# Patient Record
Sex: Male | Born: 1955 | Race: White | Hispanic: No | Marital: Married | Smoking: Former smoker
Health system: Southern US, Community
[De-identification: ages and names within clinical notes are randomized; demographics above are authoritative.]

## PROBLEM LIST (undated history)

## (undated) DIAGNOSIS — R931 Abnormal findings on diagnostic imaging of heart and coronary circulation: Secondary | ICD-10-CM

## (undated) DIAGNOSIS — M25512 Pain in left shoulder: Secondary | ICD-10-CM

## (undated) DIAGNOSIS — I447 Left bundle-branch block, unspecified: Secondary | ICD-10-CM

## (undated) DIAGNOSIS — Z951 Presence of aortocoronary bypass graft: Secondary | ICD-10-CM

## (undated) DIAGNOSIS — F17200 Nicotine dependence, unspecified, uncomplicated: Secondary | ICD-10-CM

## (undated) DIAGNOSIS — IMO0001 Reserved for inherently not codable concepts without codable children: Secondary | ICD-10-CM

## (undated) DIAGNOSIS — N529 Male erectile dysfunction, unspecified: Secondary | ICD-10-CM

## (undated) DIAGNOSIS — J449 Chronic obstructive pulmonary disease, unspecified: Secondary | ICD-10-CM

## (undated) DIAGNOSIS — R0989 Other specified symptoms and signs involving the circulatory and respiratory systems: Secondary | ICD-10-CM

## (undated) DIAGNOSIS — K649 Unspecified hemorrhoids: Secondary | ICD-10-CM

## (undated) DIAGNOSIS — E291 Testicular hypofunction: Secondary | ICD-10-CM

## (undated) DIAGNOSIS — R233 Spontaneous ecchymoses: Secondary | ICD-10-CM

## (undated) DIAGNOSIS — Z952 Presence of prosthetic heart valve: Secondary | ICD-10-CM

## (undated) DIAGNOSIS — K602 Anal fissure, unspecified: Secondary | ICD-10-CM

## (undated) DIAGNOSIS — I25119 Atherosclerotic heart disease of native coronary artery with unspecified angina pectoris: Secondary | ICD-10-CM

## (undated) DIAGNOSIS — I10 Essential (primary) hypertension: Secondary | ICD-10-CM

## (undated) DIAGNOSIS — E559 Vitamin D deficiency, unspecified: Secondary | ICD-10-CM

## (undated) DIAGNOSIS — E785 Hyperlipidemia, unspecified: Secondary | ICD-10-CM

## (undated) DIAGNOSIS — J189 Pneumonia, unspecified organism: Secondary | ICD-10-CM

## (undated) DIAGNOSIS — I350 Nonrheumatic aortic (valve) stenosis: Secondary | ICD-10-CM

## (undated) DIAGNOSIS — R252 Cramp and spasm: Secondary | ICD-10-CM

## (undated) HISTORY — DX: Nonrheumatic aortic (valve) stenosis: I35.0

## (undated) HISTORY — DX: Testicular hypofunction: E29.1

## (undated) HISTORY — DX: Unspecified hemorrhoids: K64.9

## (undated) HISTORY — PX: COLONOSCOPY W/ POLYPECTOMY: SHX1380

## (undated) HISTORY — PX: APPENDECTOMY: SHX54

## (undated) HISTORY — DX: Hyperlipidemia, unspecified: E78.5

## (undated) HISTORY — DX: Chronic obstructive pulmonary disease, unspecified: J44.9

## (undated) HISTORY — DX: Abnormal findings on diagnostic imaging of heart and coronary circulation: R93.1

## (undated) HISTORY — DX: Spontaneous ecchymoses: R23.3

## (undated) HISTORY — DX: Other specified symptoms and signs involving the circulatory and respiratory systems: R09.89

## (undated) HISTORY — DX: Anal fissure, unspecified: K60.2

## (undated) HISTORY — PX: OTHER SURGICAL HISTORY: SHX169

## (undated) HISTORY — DX: Nicotine dependence, unspecified, uncomplicated: F17.200

## (undated) HISTORY — DX: Reserved for inherently not codable concepts without codable children: IMO0001

## (undated) HISTORY — DX: Essential (primary) hypertension: I10

## (undated) HISTORY — DX: Vitamin D deficiency, unspecified: E55.9

## (undated) HISTORY — DX: Pain in left shoulder: M25.512

## (undated) HISTORY — DX: Atherosclerotic heart disease of native coronary artery with unspecified angina pectoris: I25.119

## (undated) HISTORY — DX: Male erectile dysfunction, unspecified: N52.9

## (undated) HISTORY — DX: Cramp and spasm: R25.2

---

## 2003-12-29 ENCOUNTER — Ambulatory Visit (HOSPITAL_COMMUNITY): Admission: RE | Admit: 2003-12-29 | Discharge: 2003-12-29 | Payer: Self-pay | Admitting: Critical Care Medicine

## 2004-02-09 ENCOUNTER — Ambulatory Visit
Admission: RE | Admit: 2004-02-09 | Discharge: 2004-02-09 | Payer: Self-pay | Admitting: Thoracic Surgery (Cardiothoracic Vascular Surgery)

## 2004-02-15 ENCOUNTER — Inpatient Hospital Stay (HOSPITAL_COMMUNITY)
Admission: RE | Admit: 2004-02-15 | Discharge: 2004-02-20 | Payer: Self-pay | Admitting: Thoracic Surgery (Cardiothoracic Vascular Surgery)

## 2004-04-04 ENCOUNTER — Encounter
Admission: RE | Admit: 2004-04-04 | Discharge: 2004-04-04 | Payer: Self-pay | Admitting: Thoracic Surgery (Cardiothoracic Vascular Surgery)

## 2010-11-04 ENCOUNTER — Encounter: Payer: Self-pay | Admitting: Thoracic Surgery (Cardiothoracic Vascular Surgery)

## 2014-10-14 DIAGNOSIS — J189 Pneumonia, unspecified organism: Secondary | ICD-10-CM | POA: Insufficient documentation

## 2014-10-14 HISTORY — DX: Pneumonia, unspecified organism: J18.9

## 2017-05-26 ENCOUNTER — Ambulatory Visit: Payer: Self-pay | Admitting: Cardiology

## 2017-08-06 DIAGNOSIS — I350 Nonrheumatic aortic (valve) stenosis: Secondary | ICD-10-CM | POA: Diagnosis not present

## 2017-08-06 DIAGNOSIS — I10 Essential (primary) hypertension: Secondary | ICD-10-CM | POA: Diagnosis not present

## 2017-08-06 DIAGNOSIS — E785 Hyperlipidemia, unspecified: Secondary | ICD-10-CM | POA: Diagnosis not present

## 2017-08-06 DIAGNOSIS — Z1389 Encounter for screening for other disorder: Secondary | ICD-10-CM | POA: Diagnosis not present

## 2017-08-06 DIAGNOSIS — J449 Chronic obstructive pulmonary disease, unspecified: Secondary | ICD-10-CM | POA: Diagnosis not present

## 2017-08-06 DIAGNOSIS — Z23 Encounter for immunization: Secondary | ICD-10-CM | POA: Diagnosis not present

## 2017-08-06 DIAGNOSIS — E559 Vitamin D deficiency, unspecified: Secondary | ICD-10-CM | POA: Diagnosis not present

## 2017-08-12 ENCOUNTER — Ambulatory Visit: Payer: Self-pay | Admitting: Cardiology

## 2017-08-28 DIAGNOSIS — I350 Nonrheumatic aortic (valve) stenosis: Secondary | ICD-10-CM | POA: Insufficient documentation

## 2017-08-28 DIAGNOSIS — I10 Essential (primary) hypertension: Secondary | ICD-10-CM | POA: Insufficient documentation

## 2017-08-28 DIAGNOSIS — R42 Dizziness and giddiness: Secondary | ICD-10-CM

## 2017-08-28 DIAGNOSIS — J449 Chronic obstructive pulmonary disease, unspecified: Secondary | ICD-10-CM | POA: Diagnosis not present

## 2017-08-28 DIAGNOSIS — E559 Vitamin D deficiency, unspecified: Secondary | ICD-10-CM | POA: Insufficient documentation

## 2017-08-28 HISTORY — DX: Essential (primary) hypertension: I10

## 2017-08-28 HISTORY — DX: Dizziness and giddiness: R42

## 2017-08-28 HISTORY — DX: Nonrheumatic aortic (valve) stenosis: I35.0

## 2018-06-01 DIAGNOSIS — I350 Nonrheumatic aortic (valve) stenosis: Secondary | ICD-10-CM | POA: Diagnosis not present

## 2018-06-01 DIAGNOSIS — Z79899 Other long term (current) drug therapy: Secondary | ICD-10-CM | POA: Diagnosis not present

## 2018-06-22 ENCOUNTER — Encounter: Payer: Self-pay | Admitting: Cardiology

## 2018-06-22 DIAGNOSIS — I10 Essential (primary) hypertension: Secondary | ICD-10-CM | POA: Insufficient documentation

## 2018-06-29 ENCOUNTER — Ambulatory Visit (INDEPENDENT_AMBULATORY_CARE_PROVIDER_SITE_OTHER): Payer: Commercial Managed Care - PPO

## 2018-06-29 ENCOUNTER — Encounter: Payer: Self-pay | Admitting: Cardiology

## 2018-06-29 ENCOUNTER — Ambulatory Visit: Payer: Commercial Managed Care - PPO | Admitting: Cardiology

## 2018-06-29 VITALS — BP 127/62 | Ht 71.0 in | Wt 228.4 lb

## 2018-06-29 DIAGNOSIS — I350 Nonrheumatic aortic (valve) stenosis: Secondary | ICD-10-CM

## 2018-06-29 DIAGNOSIS — Z01818 Encounter for other preprocedural examination: Secondary | ICD-10-CM | POA: Diagnosis not present

## 2018-06-29 DIAGNOSIS — N529 Male erectile dysfunction, unspecified: Secondary | ICD-10-CM | POA: Insufficient documentation

## 2018-06-29 DIAGNOSIS — N5201 Erectile dysfunction due to arterial insufficiency: Secondary | ICD-10-CM

## 2018-06-29 DIAGNOSIS — I209 Angina pectoris, unspecified: Secondary | ICD-10-CM

## 2018-06-29 DIAGNOSIS — F17210 Nicotine dependence, cigarettes, uncomplicated: Secondary | ICD-10-CM | POA: Diagnosis not present

## 2018-06-29 DIAGNOSIS — I10 Essential (primary) hypertension: Secondary | ICD-10-CM | POA: Diagnosis not present

## 2018-06-29 DIAGNOSIS — Z13220 Encounter for screening for lipoid disorders: Secondary | ICD-10-CM | POA: Diagnosis not present

## 2018-06-29 DIAGNOSIS — I700 Atherosclerosis of aorta: Secondary | ICD-10-CM | POA: Diagnosis not present

## 2018-06-29 HISTORY — DX: Male erectile dysfunction, unspecified: N52.9

## 2018-06-29 LAB — CBC WITH DIFFERENTIAL/PLATELET
BASOS: 0 %
Basophils Absolute: 0 10*3/uL (ref 0.0–0.2)
EOS (ABSOLUTE): 0.1 10*3/uL (ref 0.0–0.4)
Eos: 2 %
HEMOGLOBIN: 14.2 g/dL (ref 13.0–17.7)
Hematocrit: 41.8 % (ref 37.5–51.0)
IMMATURE GRANS (ABS): 0 10*3/uL (ref 0.0–0.1)
Immature Granulocytes: 0 %
LYMPHS: 29 %
Lymphocytes Absolute: 2.1 10*3/uL (ref 0.7–3.1)
MCH: 32.1 pg (ref 26.6–33.0)
MCHC: 34 g/dL (ref 31.5–35.7)
MCV: 94 fL (ref 79–97)
MONOCYTES: 4 %
Monocytes Absolute: 0.3 10*3/uL (ref 0.1–0.9)
Neutrophils Absolute: 4.5 10*3/uL (ref 1.4–7.0)
Neutrophils: 65 %
Platelets: 226 10*3/uL (ref 150–450)
RBC: 4.43 x10E6/uL (ref 4.14–5.80)
RDW: 13.5 % (ref 12.3–15.4)
WBC: 7 10*3/uL (ref 3.4–10.8)

## 2018-06-29 LAB — BASIC METABOLIC PANEL
BUN/Creatinine Ratio: 24 (ref 10–24)
BUN: 18 mg/dL (ref 8–27)
CALCIUM: 8.9 mg/dL (ref 8.6–10.2)
CO2: 23 mmol/L (ref 20–29)
Chloride: 105 mmol/L (ref 96–106)
Creatinine, Ser: 0.74 mg/dL — ABNORMAL LOW (ref 0.76–1.27)
GFR calc Af Amer: 113 mL/min/{1.73_m2} (ref >59)
GFR calc non Af Amer: 97 mL/min/{1.73_m2} (ref >59)
Glucose: 104 mg/dL — ABNORMAL HIGH (ref 65–99)
POTASSIUM: 4.1 mmol/L (ref 3.5–5.2)
Sodium: 140 mmol/L (ref 134–144)

## 2018-06-29 LAB — ECHOCARDIOGRAM COMPLETE
HEIGHTINCHES: 71 in
Weight: 3654.4 oz

## 2018-06-29 LAB — LIPID PANEL
Chol/HDL Ratio: 3.7 ratio (ref 0.0–5.0)
Cholesterol, Total: 139 mg/dL (ref 100–199)
HDL: 38 mg/dL — ABNORMAL LOW (ref >39)
LDL Calculated: 66 mg/dL (ref 0–99)
Triglycerides: 173 mg/dL — ABNORMAL HIGH (ref 0–149)
VLDL Cholesterol Cal: 35 mg/dL (ref 5–40)

## 2018-06-29 LAB — HEPATIC FUNCTION PANEL
ALT: 20 IU/L (ref 0–44)
AST: 13 IU/L (ref 0–40)
Albumin: 4.4 g/dL (ref 3.6–4.8)
Alkaline Phosphatase: 55 IU/L (ref 39–117)
BILIRUBIN TOTAL: 0.4 mg/dL (ref 0.0–1.2)
BILIRUBIN, DIRECT: 0.11 mg/dL (ref 0.00–0.40)
Total Protein: 6.1 g/dL (ref 6.0–8.5)

## 2018-06-29 LAB — TSH: TSH: 1.09 u[IU]/mL (ref 0.450–4.500)

## 2018-06-29 MED ORDER — ASPIRIN EC 81 MG PO TBEC: 81.0000 mg | DELAYED_RELEASE_TABLET | Freq: Every day | ORAL | 3 refills | Status: DC

## 2018-06-29 NOTE — Patient Instructions (Signed)
Medication Instructions:  Your physician has recommended you make the following change in your medication:  START 81 mg of aspirin daily  Labwork: Your physician recommends that you have the following labs drawn: CBC, BMP, TSH, liver and lipid panel.  Testing/Procedures: A chest x-ray takes a picture of the organs and structures inside the chest, including the heart, lungs, and blood vessels. This test can show several things, including, whether the heart is enlarges; whether fluid is building up in the lungs; and whether pacemaker / defibrillator leads are still in place.     [facility] CARDIOVASCULAR DIVISION [facility]ART[facility] AT [place] [address] [place] [address] Dept: [phone] Loc: [phone]  JAYTHEN HAMME  06/29/2018  You are scheduled for a Cardiac Catheterization on Tuesday, September 17 with Dr. Verdis Prime.  1. Please arrive at the [facility] (Main Entrance A) at [facility]: [address] [place], [place] [address] at 5:30 AM (This time is two hours before your procedure to ensure your preparation). Free valet parking service is available.   Special note: Every effort is made to have your procedure done on time. Please understand that emergencies sometimes delay scheduled procedures.  2. Diet: Do not eat solid foods after midnight.  The patient may have clear liquids until 5am upon the day of the procedure.  3. Labs: Done today.  4. Medication instructions in preparation for your procedure:  Stop taking, lisinopril-HCTZ on Tuesday, September 17.  On the morning of your procedure, take your Aspirin and any morning medicines NOT listed above.  You may use sips of water.  5. Plan for one night stay--bring personal belongings. 6. Bring a current list of your medications and current insurance cards. 7. You MUST have a responsible person to drive you home. 8. Someone MUST be with you the first 24 hours after you arrive  home or your discharge will be delayed. 9. Please wear clothes that are easy to get on and off and wear slip-on shoes.  Thank you for allowing [place] to care for you!   -- [facility]   Follow-Up: Your physician recommends that you schedule a follow-up appointment in: 4 weeks  Any Other Special Instructions Will Be Listed Below (If Applicable).     If you need a refill on your cardiac medications before your next appointment, please call your pharmacy.   [facility]  Garey Ham, RN, BSN  Coronary Angiogram With Stent Coronary angiogram with stent placement is a procedure to widen or open a narrow blood vessel of the heart (coronary artery). Arteries may become blocked by cholesterol buildup (plaques) in the lining of the wall. When a coronary artery becomes partially blocked, blood flow to that area decreases. This may lead to chest pain or a heart attack (myocardial infarction). A stent is a small piece of metal that looks like mesh or a spring. Stent placement may be done as treatment for a heart attack or right after a coronary angiogram in which a blocked artery is found. Let your health care provider know about:  Any allergies you have.  All medicines you are taking, including vitamins, herbs, eye drops, creams, and over-the-counter medicines.  Any problems you or family members have had with anesthetic medicines.  Any blood disorders you have.  Any surgeries you have had.  Any medical conditions you have.  Whether you are pregnant or may be pregnant. What are the risks? Generally, this is  a safe procedure. However, problems may occur, including:  Damage to the heart or its blood vessels.  A return of blockage.  Bleeding, infection, or bruising at the insertion site.  A collection of blood under the skin (hematoma) at the insertion site.  A blood clot in another part of the body.  Kidney injury.  Allergic reaction to the dye  or contrast that is used.  Bleeding into the abdomen (retroperitoneal bleeding).  What happens before the procedure? Staying hydrated Follow instructions from your health care provider about hydration, which may include:  Up to 2 hours before the procedure - you may continue to drink clear liquids, such as water, clear fruit juice, black coffee, and plain tea.  Eating and drinking restrictions Follow instructions from your health care provider about eating and drinking, which may include:  8 hours before the procedure - stop eating heavy meals or foods such as meat, fried foods, or fatty foods.  6 hours before the procedure - stop eating light meals or foods, such as toast or cereal.  2 hours before the procedure - stop drinking clear liquids.  Ask your health care provider about:  Changing or stopping your regular medicines. This is especially important if you are taking diabetes medicines or blood thinners.  Taking medicines such as ibuprofen. These medicines can thin your blood. Do not take these medicines before your procedure if your health care provider instructs you not to. Generally, aspirin is recommended before a procedure of passing a small, thin tube (catheter) through a blood vessel and into the heart (cardiac catheterization).  What happens during the procedure?  An IV tube will be inserted into one of your veins.  You will be given one or more of the following: ? A medicine to help you relax (sedative). ? A medicine to numb the area where the catheter will be inserted into an artery (local anesthetic).  To reduce your risk of infection: ? Your health care team will wash or sanitize their hands. ? Your skin will be washed with soap. ? Hair may be removed from the area where the catheter will be inserted.  Using a guide wire, the catheter will be inserted into an artery. The location may be in your groin, in your wrist, or in the fold of your arm (near your  elbow).  A type of X-ray (fluoroscopy) will be used to help guide the catheter to the opening of the arteries in the heart.  A dye will be injected into the catheter, and X-rays will be taken. The dye will help to show where any narrowing or blockages are located in the arteries.  A tiny wire will be guided to the blocked spot, and a balloon will be inflated to make the artery wider.  The stent will be expanded and will crush the plaques into the wall of the vessel. The stent will hold the area open and improve the blood flow. Most stents have a drug coating to reduce the risk of the stent narrowing over time.  The artery may be made wider using a drill, laser, or other tools to remove plaques.  When the blood flow is better, the catheter will be removed. The lining of the artery will grow over the stent, which stays where it was placed. This procedure may vary among health care providers and hospitals. What happens after the procedure?  If the procedure is done through the leg, you will be kept in bed lying flat for about  6 hours. You will be instructed to not bend and not cross your legs.  The insertion site will be checked frequently.  The pulse in your foot or wrist will be checked frequently.  You may have additional blood tests, X-rays, and a test that records the electrical activity of your heart (electrocardiogram, or ECG). This information is not intended to replace advice given to you by your health care provider. Make sure you discuss any questions you have with your health care provider. Document Released: 04/06/2003 Document Revised: 05/30/2016 Document Reviewed: 05/05/2016 Elsevier Interactive Patient Education  2018 [employer].  Aspirin and Your Heart Aspirin is a medicine that affects the way blood clots. Aspirin can be used to help reduce the risk of blood clots, heart attacks, and other heart-related problems. Should I take aspirin? Your health care provider will  help you determine whether it is safe and beneficial for you to take aspirin daily. Taking aspirin daily may be beneficial if you:  Have had a heart attack or chest pain.  Have undergone open heart surgery such as coronary artery bypass surgery (CABG).  Have had coronary angioplasty.  Have experienced a stroke or transient ischemic attack (TIA).  Have peripheral vascular disease (PVD).  Have chronic heart rhythm problems such as atrial fibrillation.  Are there any risks of taking aspirin daily? Daily use of aspirin can increase your risk of side effects. Some of these include:  Bleeding. Bleeding problems can be minor or serious. An example of a minor problem is a cut that does not stop bleeding. An example of a more serious problem is stomach bleeding or bleeding into the brain. Your risk of bleeding is increased if you are also taking non-steroidal anti-inflammatory medicine (NSAIDs).  Increased bruising.  Upset stomach.  An allergic reaction. People who have nasal polyps have an increased risk of developing an aspirin allergy.  What are some guidelines I should follow when taking aspirin?  Take aspirin only as directed by your health care provider. Make sure you understand how much you should take and what form you should take. The two forms of aspirin are: ? Non-enteric-coated. This type of aspirin does not have a coating and is absorbed quickly. Non-enteric-coated aspirin is usually recommended for people with chest pain. This type of aspirin also comes in a chewable form. ? Enteric-coated. This type of aspirin has a special coating that releases the medicine very slowly. Enteric-coated aspirin causes less stomach upset than non-enteric-coated aspirin. This type of aspirin should not be chewed or crushed.  Drink alcohol in moderation. Drinking alcohol increases your risk of bleeding. When should I seek medical care?  You have unusual bleeding or bruising.  You have stomach  pain.  You have an allergic reaction. Symptoms of an allergic reaction include: ? Hives. ? Itchy skin. ? Swelling of the lips, tongue, or face.  You have ringing in your ears. When should I seek immediate medical care?  Your bowel movements are bloody, dark red, or black in color.  You vomit or cough up blood.  You have blood in your urine.  You cough, wheeze, or feel short of breath. If you have any of the following symptoms, this is an emergency. Do not wait to see if the pain will go away. Get medical help at once. Call your local emergency services (911 in the [place] Do not drive yourself to the hospital.  You have severe chest pain, especially if the pain is crushing or pressure-like and spreads  to the arms, back, neck, or jaw.  You have stroke-like symptoms, such as: ? Loss of vision. ? Difficulty talking. ? Numbness or weakness on one side of your body. ? Numbness or weakness in your arm or leg. ? Not thinking clearly or feeling confused.  This information is not intended to replace advice given to you by your health care provider. Make sure you discuss any questions you have with your health care provider. Document Released: 09/12/2008 Document Revised: 02/07/2016 Document Reviewed: 01/05/2014 Elsevier Interactive Patient Education  2018 [employer].

## 2018-06-29 NOTE — H&P (View-Only) (Signed)
Cardiology Office Note:    Date:  06/29/2018   ID:  Blake Griffin, DOB 13-Apr-1956, MRN [id number]  PCP:  Wilmer Floor., MD  Cardiologist:  Garwin Brothers, MD   Referring MD: Wilmer Floor., MD    ASSESSMENT:    1. Angina pectoris (HCC)   2. Aortic valve stenosis, etiology of cardiac valve disease unspecified   3. Essential hypertension   4. Cigarette smoker   5. Erectile dysfunction due to arterial insufficiency    PLAN:    In order of problems listed above:  1. Patient symptoms are very concerning and he has multiple risk factors for coronary artery disease.  His symptoms are very suggestive of angina pectoris. 2. Plan to obtain a urgent echocardiogram to assess left ventricular systolic function and the murmur heard on auscultation and the status of the aortic valve. 3. In view of the patient's symptoms, I discussed with the patient options for evaluation. Invasive and noninvasive options were given to the patient. I discussed stress testing and coronary angiography and left heart catheterization at length. Benefits, pros and cons of each approach were discussed at length. Patient had multiple questions which were answered to the patient's satisfaction. Patient opted for invasive evaluation and we will set up for coronary angiography and left heart catheterization. Further recommendations will be made based on the findings with coronary angiography. In the interim if the patient has any significant symptoms in hospital to the nearest emergency room. 4. I spent 5 minutes with the patient discussing solely about smoking. Smoking cessation was counseled. I suggested to the patient also different medications and pharmacological interventions. Patient is keen to try stopping on its own at this time. He will get back to me if he needs any further assistance in this matter.    Medication Adjustments/Labs and Tests Ordered: Current medicines are reviewed at length with the  patient today.  Concerns regarding medicines are outlined above.  No orders of the defined types were placed in this encounter.  No orders of the defined types were placed in this encounter.    History of Present Illness:    Blake Griffin is a 62 y.o. male who is being seen today for the evaluation of chest tightness on exertion at the request of Wilmer Floor., MD.  Patient is a pleasant 62 year old gentleman.  He has a history of aortic stenosis and essential hypertension.  He mentions to me that upon exertion he has chest tightness he has to stop and pace himself.  He leads a very sedentary lifestyle.  No orthopnea or PND.  No dizziness no syncopal spells.  No shortness of breath.  This reason he sent here for an evaluation.  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  At the time of my evaluation, the patient is alert awake oriented and in no distress.  His a very extensive smoking history smoking more than a pack since young age.  Past Medical History:  Diagnosis Date  . Anal fissure   . Aortic stenosis   . Bilateral carotid bruits   . COPD (chronic obstructive pulmonary disease) (HCC)   . Dyslipidemia   . ED (erectile dysfunction)   . Hemorrhoids   . Hypertension   . Hypogonadism in male   . Leg cramps   . Osteoarthrosis   . Pain of left shoulder region   . Petechiae   . Smoking   . Vitamin D deficiency  Past Surgical History:  Procedure Laterality Date  . APPENDECTOMY      Current Medications: Current Meds  Medication Sig  . albuterol (PROVENTIL HFA;VENTOLIN HFA) 108 (90 Base) MCG/ACT inhaler Inhale 2 puffs into the lungs every 6 (six) hours as needed.  Marland Kitchen. lisinopril-hydrochlorothiazide (PRINZIDE,ZESTORETIC) 20-12.5 MG tablet Take 1 tablet by mouth daily.  . valACYclovir (VALTREX) 500 MG tablet Take 500 mg by mouth as needed.      Allergies:   Patient has no known allergies.   Social History   Socioeconomic History  . Marital  status: Married    Spouse name: Not on file  . Number of children: Not on file  . Years of education: Not on file  . Highest education level: Not on file  Occupational History  . Not on file  Social Needs  . Financial resource strain: Not on file  . Food insecurity:    Worry: Not on file    Inability: Not on file  . Transportation needs:    Medical: Not on file    Non-medical: Not on file  Tobacco Use  . Smoking status: Current Every Day Smoker  . Smokeless tobacco: Never Used  Substance and Sexual Activity  . Alcohol use: Not on file  . Drug use: Not on file  . Sexual activity: Not on file  Lifestyle  . Physical activity:    Days per week: Not on file    Minutes per session: Not on file  . Stress: Not on file  Relationships  . Social connections:    Talks on phone: Not on file    Gets together: Not on file    Attends religious service: Not on file    Active member of club or organization: Not on file    Attends meetings of clubs or organizations: Not on file    Relationship status: Not on file  Other Topics Concern  . Not on file  Social History Narrative  . Not on file     Family History: The patient's family history includes Hypertension in his father and mother.  ROS:   Please see the history of present illness.    All other systems reviewed and are negative.  EKGs/Labs/Other Studies Reviewed:    The following studies were reviewed today: EKG reveals sinus rhythm and nonspecific ST-T changes   Recent Labs: No results found for requested labs within last 8760 hours.  Recent Lipid Panel No results found for: CHOL, TRIG, HDL, CHOLHDL, VLDL, LDLCALC, LDLDIRECT  Physical Exam:    VS:  BP 127/62 (BP Location: Right Arm, Patient Position: Sitting, Cuff Size: Normal)   Ht 5\' 11"  (1.803 m)   Wt 228 lb 6.4 oz (103.6 kg)   SpO2 98%   BMI 31.86 kg/m     Wt Readings from Last 3 Encounters:  06/29/18 228 lb 6.4 oz (103.6 kg)     GEN: Patient is in no  acute distress HEENT: Normal NECK: No JVD; No carotid bruits LYMPHATICS: No lymphadenopathy CARDIAC: S1 S2 regular, 3/6 systolic murmur at the aortic area. RESPIRATORY:  Clear to auscultation without rales, wheezing or rhonchi  ABDOMEN: Soft, non-tender, non-distended MUSCULOSKELETAL:  No edema; No deformity  SKIN: Warm and dry NEUROLOGIC:  Alert and oriented x 3 PSYCHIATRIC:  Normal affect    Signed, Garwin Brothersajan R Revankar, MD  06/29/2018 9:13 AM    [facility]

## 2018-06-29 NOTE — Progress Notes (Signed)
Cardiology Office Note:    Date:  06/29/2018   ID:  Blake Griffin, DOB 07/20/1956, MRN [id number]  PCP:  Campbell, Stephen D., MD  Cardiologist:  Rajan R Revankar, MD   Referring MD: Campbell, Stephen D., MD    ASSESSMENT:    1. Angina pectoris (HCC)   2. Aortic valve stenosis, etiology of cardiac valve disease unspecified   3. Essential hypertension   4. Cigarette smoker   5. Erectile dysfunction due to arterial insufficiency    PLAN:    In order of problems listed above:  1. Patient symptoms are very concerning and he has multiple risk factors for coronary artery disease.  His symptoms are very suggestive of angina pectoris. 2. Plan to obtain a urgent echocardiogram to assess left ventricular systolic function and the murmur heard on auscultation and the status of the aortic valve. 3. In view of the patient's symptoms, I discussed with the patient options for evaluation. Invasive and noninvasive options were given to the patient. I discussed stress testing and coronary angiography and left heart catheterization at length. Benefits, pros and cons of each approach were discussed at length. Patient had multiple questions which were answered to the patient's satisfaction. Patient opted for invasive evaluation and we will set up for coronary angiography and left heart catheterization. Further recommendations will be made based on the findings with coronary angiography. In the interim if the patient has any significant symptoms in hospital to the nearest emergency room. 4. I spent 5 minutes with the patient discussing solely about smoking. Smoking cessation was counseled. I suggested to the patient also different medications and pharmacological interventions. Patient is keen to try stopping on its own at this time. He will get back to me if he needs any further assistance in this matter.    Medication Adjustments/Labs and Tests Ordered: Current medicines are reviewed at length with the  patient today.  Concerns regarding medicines are outlined above.  No orders of the defined types were placed in this encounter.  No orders of the defined types were placed in this encounter.    History of Present Illness:    Blake Griffin is a 62 y.o. male who is being seen today for the evaluation of chest tightness on exertion at the request of Campbell, Stephen D., MD.  Patient is a pleasant 62-year-old gentleman.  He has a history of aortic stenosis and essential hypertension.  He mentions to me that upon exertion he has chest tightness he has to stop and pace himself.  He leads a very sedentary lifestyle.  No orthopnea or PND.  No dizziness no syncopal spells.  No shortness of breath.  This reason he sent here for an evaluation.  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  At the time of my evaluation, the patient is alert awake oriented and in no distress.  His a very extensive smoking history smoking more than a pack since young age.  Past Medical History:  Diagnosis Date  . Anal fissure   . Aortic stenosis   . Bilateral carotid bruits   . COPD (chronic obstructive pulmonary disease) (HCC)   . Dyslipidemia   . ED (erectile dysfunction)   . Hemorrhoids   . Hypertension   . Hypogonadism in male   . Leg cramps   . Osteoarthrosis   . Pain of left shoulder region   . Petechiae   . Smoking   . Vitamin D deficiency       Past Surgical History:  Procedure Laterality Date  . APPENDECTOMY      Current Medications: Current Meds  Medication Sig  . albuterol (PROVENTIL HFA;VENTOLIN HFA) 108 (90 Base) MCG/ACT inhaler Inhale 2 puffs into the lungs every 6 (six) hours as needed.  . lisinopril-hydrochlorothiazide (PRINZIDE,ZESTORETIC) 20-12.5 MG tablet Take 1 tablet by mouth daily.  . valACYclovir (VALTREX) 500 MG tablet Take 500 mg by mouth as needed.      Allergies:   Patient has no known allergies.   Social History   Socioeconomic History  . Marital  status: Married    Spouse name: Not on file  . Number of children: Not on file  . Years of education: Not on file  . Highest education level: Not on file  Occupational History  . Not on file  Social Needs  . Financial resource strain: Not on file  . Food insecurity:    Worry: Not on file    Inability: Not on file  . Transportation needs:    Medical: Not on file    Non-medical: Not on file  Tobacco Use  . Smoking status: Current Every Day Smoker  . Smokeless tobacco: Never Used  Substance and Sexual Activity  . Alcohol use: Not on file  . Drug use: Not on file  . Sexual activity: Not on file  Lifestyle  . Physical activity:    Days per week: Not on file    Minutes per session: Not on file  . Stress: Not on file  Relationships  . Social connections:    Talks on phone: Not on file    Gets together: Not on file    Attends religious service: Not on file    Active member of club or organization: Not on file    Attends meetings of clubs or organizations: Not on file    Relationship status: Not on file  Other Topics Concern  . Not on file  Social History Narrative  . Not on file     Family History: The patient's family history includes Hypertension in his father and mother.  ROS:   Please see the history of present illness.    All other systems reviewed and are negative.  EKGs/Labs/Other Studies Reviewed:    The following studies were reviewed today: EKG reveals sinus rhythm and nonspecific ST-T changes   Recent Labs: No results found for requested labs within last 8760 hours.  Recent Lipid Panel No results found for: CHOL, TRIG, HDL, CHOLHDL, VLDL, LDLCALC, LDLDIRECT  Physical Exam:    VS:  BP 127/62 (BP Location: Right Arm, Patient Position: Sitting, Cuff Size: Normal)   Ht 5' 11" (1.803 m)   Wt 228 lb 6.4 oz (103.6 kg)   SpO2 98%   BMI 31.86 kg/m     Wt Readings from Last 3 Encounters:  06/29/18 228 lb 6.4 oz (103.6 kg)     GEN: Patient is in no  acute distress HEENT: Normal NECK: No JVD; No carotid bruits LYMPHATICS: No lymphadenopathy CARDIAC: S1 S2 regular, 3/6 systolic murmur at the aortic area. RESPIRATORY:  Clear to auscultation without rales, wheezing or rhonchi  ABDOMEN: Soft, non-tender, non-distended MUSCULOSKELETAL:  No edema; No deformity  SKIN: Warm and dry NEUROLOGIC:  Alert and oriented x 3 PSYCHIATRIC:  Normal affect    Signed, Rajan R Revankar, MD  06/29/2018 9:13 AM     [facility]   

## 2018-06-29 NOTE — Progress Notes (Signed)
Complete echocardiogram has been performed.  Jimmy Marysol Wellnitz RDCS, RVT 

## 2018-06-29 NOTE — Addendum Note (Signed)
Addended by: Craige CottaANDERSON, ASHLEY S on: 06/29/2018 09:38 AM   Modules accepted: Orders

## 2018-06-29 NOTE — H&P (Signed)
Critical aortic stenosis by echo. Cardiac catheterization should be left and right heart with coronary angiography to stage IV aortic valve treatment and possible coronary disease of present.

## 2018-06-30 ENCOUNTER — Encounter (HOSPITAL_COMMUNITY)
Admission: RE | Disposition: A | Discharge status: Home / Self Care | Payer: Self-pay | Source: Ambulatory Visit | Attending: Interventional Cardiology

## 2018-06-30 ENCOUNTER — Ambulatory Visit (HOSPITAL_COMMUNITY)
Admission: RE | Admit: 2018-06-30 | Discharge: 2018-06-30 | Disposition: A | Discharge status: Home / Self Care | Payer: Commercial Managed Care - PPO | Source: Ambulatory Visit | Attending: Interventional Cardiology | Admitting: Interventional Cardiology

## 2018-06-30 DIAGNOSIS — I25119 Atherosclerotic heart disease of native coronary artery with unspecified angina pectoris: Secondary | ICD-10-CM | POA: Diagnosis not present

## 2018-06-30 DIAGNOSIS — I771 Stricture of artery: Secondary | ICD-10-CM | POA: Insufficient documentation

## 2018-06-30 DIAGNOSIS — M1990 Unspecified osteoarthritis, unspecified site: Secondary | ICD-10-CM | POA: Insufficient documentation

## 2018-06-30 DIAGNOSIS — N5201 Erectile dysfunction due to arterial insufficiency: Secondary | ICD-10-CM | POA: Insufficient documentation

## 2018-06-30 DIAGNOSIS — I10 Essential (primary) hypertension: Secondary | ICD-10-CM | POA: Insufficient documentation

## 2018-06-30 DIAGNOSIS — J449 Chronic obstructive pulmonary disease, unspecified: Secondary | ICD-10-CM | POA: Insufficient documentation

## 2018-06-30 DIAGNOSIS — I2510 Atherosclerotic heart disease of native coronary artery without angina pectoris: Secondary | ICD-10-CM | POA: Diagnosis not present

## 2018-06-30 DIAGNOSIS — I350 Nonrheumatic aortic (valve) stenosis: Secondary | ICD-10-CM | POA: Diagnosis not present

## 2018-06-30 DIAGNOSIS — F17210 Nicotine dependence, cigarettes, uncomplicated: Secondary | ICD-10-CM | POA: Diagnosis not present

## 2018-06-30 DIAGNOSIS — E785 Hyperlipidemia, unspecified: Secondary | ICD-10-CM | POA: Insufficient documentation

## 2018-06-30 DIAGNOSIS — I209 Angina pectoris, unspecified: Secondary | ICD-10-CM

## 2018-06-30 HISTORY — PX: RIGHT/LEFT HEART CATH AND CORONARY ANGIOGRAPHY: CATH118266

## 2018-06-30 LAB — POCT I-STAT 3, VENOUS BLOOD GAS (G3P V)
ACID-BASE DEFICIT: 2 mmol/L (ref 0.0–2.0)
BICARBONATE: 24.6 mmol/L (ref 20.0–28.0)
O2 SAT: 69 %
PO2 VEN: 39 mmHg (ref 32.0–45.0)
TCO2: 26 mmol/L (ref 22–32)
pCO2, Ven: 46.3 mmHg (ref 44.0–60.0)
pH, Ven: 7.333 (ref 7.250–7.430)

## 2018-06-30 LAB — POCT I-STAT 3, ART BLOOD GAS (G3+)
Acid-base deficit: 6 mmol/L — ABNORMAL HIGH (ref 0.0–2.0)
Bicarbonate: 20.5 mmol/L (ref 20.0–28.0)
O2 SAT: 94 %
PO2 ART: 77 mmHg — AB (ref 83.0–108.0)
TCO2: 22 mmol/L (ref 22–32)
pCO2 arterial: 42.6 mmHg (ref 32.0–48.0)
pH, Arterial: 7.29 — ABNORMAL LOW (ref 7.350–7.450)

## 2018-06-30 SURGERY — RIGHT/LEFT HEART CATH AND CORONARY ANGIOGRAPHY
Anesthesia: LOCAL

## 2018-06-30 MED ORDER — HEPARIN (PORCINE) IN NACL 1000-0.9 UT/500ML-% IV SOLN
INTRAVENOUS | Status: DC | PRN
  Administered 2018-06-30 (×2): 500 mL

## 2018-06-30 MED ORDER — HEPARIN SODIUM (PORCINE) 1000 UNIT/ML IJ SOLN
INTRAMUSCULAR | Status: DC | PRN
  Administered 2018-06-30: 5000 [IU] via INTRAVENOUS

## 2018-06-30 MED ORDER — SODIUM CHLORIDE 0.9 % WEIGHT BASED INFUSION
3.0000 mL/kg/h | INTRAVENOUS | Status: AC
  Administered 2018-06-30: 3 mL/kg/h via INTRAVENOUS

## 2018-06-30 MED ORDER — ONDANSETRON HCL 4 MG/2ML IJ SOLN: 4.0000 mg | Freq: Four times a day (QID) | INTRAMUSCULAR | Status: DC | PRN

## 2018-06-30 MED ORDER — VERAPAMIL HCL 2.5 MG/ML IV SOLN
INTRAVENOUS | Status: AC
  Filled 2018-06-30: qty 2

## 2018-06-30 MED ORDER — HEPARIN (PORCINE) IN NACL 1000-0.9 UT/500ML-% IV SOLN
INTRAVENOUS | Status: AC
  Filled 2018-06-30: qty 1000

## 2018-06-30 MED ORDER — OXYCODONE HCL 5 MG PO TABS: 5.0000 mg | ORAL_TABLET | ORAL | Status: DC | PRN

## 2018-06-30 MED ORDER — ATORVASTATIN CALCIUM 80 MG PO TABS
80.0000 mg | ORAL_TABLET | Freq: Every day | ORAL | Status: DC
  Administered 2018-06-30: 80 mg via ORAL
  Filled 2018-06-30: qty 1

## 2018-06-30 MED ORDER — ASPIRIN 81 MG PO CHEW: 81.0000 mg | CHEWABLE_TABLET | ORAL | Status: DC

## 2018-06-30 MED ORDER — ASPIRIN 81 MG PO CHEW: 81.0000 mg | CHEWABLE_TABLET | Freq: Every day | ORAL | Status: DC

## 2018-06-30 MED ORDER — SODIUM CHLORIDE 0.9 % IV SOLN
INTRAVENOUS | Status: DC
  Administered 2018-06-30: 09:00:00 via INTRAVENOUS

## 2018-06-30 MED ORDER — LIDOCAINE HCL (PF) 1 % IJ SOLN
INTRAMUSCULAR | Status: DC | PRN
  Administered 2018-06-30 (×2): 2 mL

## 2018-06-30 MED ORDER — IOHEXOL 350 MG/ML SOLN
INTRAVENOUS | Status: DC | PRN
  Administered 2018-06-30: 75 mL via INTRA_ARTERIAL

## 2018-06-30 MED ORDER — FENTANYL CITRATE (PF) 100 MCG/2ML IJ SOLN
INTRAMUSCULAR | Status: AC
  Filled 2018-06-30: qty 2

## 2018-06-30 MED ORDER — VERAPAMIL HCL 2.5 MG/ML IV SOLN
INTRAVENOUS | Status: DC | PRN
  Administered 2018-06-30: 10 mL via INTRA_ARTERIAL

## 2018-06-30 MED ORDER — ACETAMINOPHEN 325 MG PO TABS: 650.0000 mg | ORAL_TABLET | ORAL | Status: DC | PRN

## 2018-06-30 MED ORDER — SODIUM CHLORIDE 0.9 % WEIGHT BASED INFUSION: 1.0000 mL/kg/h | INTRAVENOUS | Status: DC

## 2018-06-30 MED ORDER — SODIUM CHLORIDE 0.9% FLUSH: 3.0000 mL | Freq: Two times a day (BID) | INTRAVENOUS | Status: DC

## 2018-06-30 MED ORDER — SODIUM CHLORIDE 0.9 % IV SOLN: 250.0000 mL | INTRAVENOUS | Status: DC | PRN

## 2018-06-30 MED ORDER — MIDAZOLAM HCL 2 MG/2ML IJ SOLN
INTRAMUSCULAR | Status: AC
  Filled 2018-06-30: qty 2

## 2018-06-30 MED ORDER — FENTANYL CITRATE (PF) 100 MCG/2ML IJ SOLN
INTRAMUSCULAR | Status: DC | PRN
  Administered 2018-06-30: 25 ug via INTRAVENOUS

## 2018-06-30 MED ORDER — LIDOCAINE HCL (PF) 1 % IJ SOLN
INTRAMUSCULAR | Status: AC
  Filled 2018-06-30: qty 30

## 2018-06-30 MED ORDER — SODIUM CHLORIDE 0.9% FLUSH: 3.0000 mL | INTRAVENOUS | Status: DC | PRN

## 2018-06-30 MED ORDER — MIDAZOLAM HCL 2 MG/2ML IJ SOLN
INTRAMUSCULAR | Status: DC | PRN
  Administered 2018-06-30: 0.5 mg via INTRAVENOUS
  Administered 2018-06-30: 1 mg via INTRAVENOUS

## 2018-06-30 MED ORDER — ATORVASTATIN CALCIUM 40 MG PO TABS: 40.0000 mg | ORAL_TABLET | Freq: Every day | ORAL | 11 refills | Status: DC

## 2018-06-30 MED ORDER — HEPARIN SODIUM (PORCINE) 1000 UNIT/ML IJ SOLN
INTRAMUSCULAR | Status: AC
  Filled 2018-06-30: qty 1

## 2018-06-30 SURGICAL SUPPLY — 14 items

## 2018-06-30 NOTE — Interval H&P Note (Signed)
Cath Lab Visit (complete for each Cath Lab visit)  Clinical Evaluation Leading to the Procedure:   ACS: No.  Non-ACS:    Anginal Classification: CCS III  Anti-ischemic medical therapy: Minimal Therapy (1 class of medications)  Non-Invasive Test Results: High-risk stress test findings: cardiac mortality >3%/year  Prior CABG: No previous CABG      History and Physical Interval Note:  06/30/2018 7:21 AM  Blake Griffin  has presented today for surgery, with the diagnosis of angina  The various methods of treatment have been discussed with the patient and family. After consideration of risks, benefits and other options for treatment, the patient has consented to  Procedure(s): LEFT HEART CATH AND CORONARY ANGIOGRAPHY (N/A) as a surgical intervention .  The patient's history has been reviewed, patient examined, no change in status, stable for surgery.  I have reviewed the patient's chart and labs.  Questions were answered to the patient's satisfaction.     Lyn RecordsHenry W Smith III

## 2018-06-30 NOTE — Discharge Instructions (Signed)
Radial Site Care °Refer to this sheet in the next few weeks. These instructions provide you with information about caring for yourself after your procedure. Your health care provider may also give you more specific instructions. Your treatment has been planned according to current medical practices, but problems sometimes occur. Call your health care provider if you have any problems or questions after your procedure. °What can I expect after the procedure? °After your procedure, it is typical to have the following: °· Bruising at the radial site that usually fades within 1-2 weeks. °· Blood collecting in the tissue (hematoma) that may be painful to the touch. It should usually decrease in size and tenderness within 1-2 weeks. ° °Follow these instructions at home: °· Take medicines only as directed by your health care provider. °· You may shower 24-48 hours after the procedure or as directed by your health care provider. Remove the bandage (dressing) and gently wash the site with plain soap and water. Pat the area dry with a clean towel. Do not rub the site, because this may cause bleeding. °· Do not take baths, swim, or use a hot tub until your health care provider approves. °· Check your insertion site every day for redness, swelling, or drainage. °· Do not apply powder or lotion to the site. °· Do not flex or bend the affected arm for 24 hours or as directed by your health care provider. °· Do not push or pull heavy objects with the affected arm for 24 hours or as directed by your health care provider. °· Do not lift over 10 lb (4.5 kg) for 5 days after your procedure or as directed by your health care provider. °· Ask your health care provider when it is okay to: °? Return to work or school. °? Resume usual physical activities or sports. °? Resume sexual activity. °· Do not drive home if you are discharged the same day as the procedure. Have someone else drive you. °· You may drive 24 hours after the procedure  unless otherwise instructed by your health care provider. °· Do not operate machinery or power tools for 24 hours after the procedure. °· If your procedure was done as an outpatient procedure, which means that you went home the same day as your procedure, a responsible adult should be with you for the first 24 hours after you arrive home. °· Keep all follow-up visits as directed by your health care provider. This is important. °Contact a health care provider if: °· You have a fever. °· You have chills. °· You have increased bleeding from the radial site. Hold pressure on the site. °Get help right away if: °· You have unusual pain at the radial site. °· You have redness, warmth, or swelling at the radial site. °· You have drainage (other than a small amount of blood on the dressing) from the radial site. °· The radial site is bleeding, and the bleeding does not stop after 30 minutes of holding steady pressure on the site. °· Your arm or hand becomes pale, cool, tingly, or numb. °This information is not intended to replace advice given to you by your health care provider. Make sure you discuss any questions you have with your health care provider. °Document Released: 11/02/2010 Document Revised: 03/07/2016 Document Reviewed: 04/18/2014 °[employer] Interactive Patient Education © 2018 [employer] ° ° ° °Moderate Conscious Sedation, Adult, Care After °These instructions provide you with information about caring for yourself after your procedure. Your health care provider   may also give you more specific instructions. Your treatment has been planned according to current medical practices, but problems sometimes occur. Call your health care provider if you have any problems or questions after your procedure. °What can I expect after the procedure? °After your procedure, it is common: °· To feel sleepy for several hours. °· To feel clumsy and have poor balance for several hours. °· To have poor judgment for several  hours. °· To vomit if you eat too soon. ° °Follow these instructions at home: °For at least 24 hours after the procedure: ° °· Do not: °? Participate in activities where you could fall or become injured. °? Drive. °? Use heavy machinery. °? Drink alcohol. °? Take sleeping pills or medicines that cause drowsiness. °? Make important decisions or sign legal documents. °? Take care of children on your own. °· Rest. °Eating and drinking °· Follow the diet recommended by your health care provider. °· If you vomit: °? Drink water, juice, or soup when you can drink without vomiting. °? Make sure you have little or no nausea before eating solid foods. °General instructions °· Have a responsible adult stay with you until you are awake and alert. °· Take over-the-counter and prescription medicines only as told by your health care provider. °· If you smoke, do not smoke without supervision. °· Keep all follow-up visits as told by your health care provider. This is important. °Contact a health care provider if: °· You keep feeling nauseous or you keep vomiting. °· You feel light-headed. °· You develop a rash. °· You have a fever. °Get help right away if: °· You have trouble breathing. °This information is not intended to replace advice given to you by your health care provider. Make sure you discuss any questions you have with your health care provider. °Document Released: 07/21/2013 Document Revised: 03/04/2016 Document Reviewed: 01/20/2016 °[employer] Interactive Patient Education © 2018 [employer] ° °

## 2018-07-01 ENCOUNTER — Encounter (HOSPITAL_COMMUNITY): Payer: Self-pay | Admitting: Interventional Cardiology

## 2018-07-16 ENCOUNTER — Encounter: Payer: Commercial Managed Care - PPO | Admitting: Thoracic Surgery (Cardiothoracic Vascular Surgery)

## 2018-07-20 ENCOUNTER — Institutional Professional Consult (permissible substitution): Payer: Commercial Managed Care - PPO | Admitting: Thoracic Surgery (Cardiothoracic Vascular Surgery)

## 2018-07-20 ENCOUNTER — Encounter: Payer: Self-pay | Admitting: Thoracic Surgery (Cardiothoracic Vascular Surgery)

## 2018-07-20 ENCOUNTER — Other Ambulatory Visit: Payer: Self-pay | Admitting: *Deleted

## 2018-07-20 ENCOUNTER — Other Ambulatory Visit: Payer: Self-pay

## 2018-07-20 VITALS — BP 142/78 | HR 62 | Resp 18 | Ht 71.0 in | Wt 227.2 lb

## 2018-07-20 DIAGNOSIS — I350 Nonrheumatic aortic (valve) stenosis: Secondary | ICD-10-CM

## 2018-07-20 DIAGNOSIS — I25119 Atherosclerotic heart disease of native coronary artery with unspecified angina pectoris: Secondary | ICD-10-CM

## 2018-07-20 DIAGNOSIS — I2510 Atherosclerotic heart disease of native coronary artery without angina pectoris: Secondary | ICD-10-CM

## 2018-07-20 DIAGNOSIS — I7409 Other arterial embolism and thrombosis of abdominal aorta: Secondary | ICD-10-CM

## 2018-07-20 DIAGNOSIS — Z01818 Encounter for other preprocedural examination: Secondary | ICD-10-CM

## 2018-07-20 NOTE — Patient Instructions (Addendum)
Stop smoking immediately and permanently.  Continue taking all current medications without change through the day before surgery.  Have nothing to eat or drink after midnight the night before surgery.  On the morning of surgery do not take any medications.  You may use your inhaler as needed  Avoid any type of strenuous physical activity between now and your surgery.  Call your cardiologist or go directly to the emergency room if you develop chest pain that does not resolve with rest or nitroglycerine

## 2018-07-20 NOTE — Progress Notes (Addendum)
[facility]  MULTIDISCIPLINARY HEART VALVE CLINIC  CARDIOTHORACIC SURGERY CONSULTATION REPORT  Referring Provider is Lyn Records, MD  Primary Cardiologist is Revankar, Aundra Dubin, MD PCP is Wilmer Floor., MD  Chief Complaint  Patient presents with  . Aortic Stenosis    new patient consultation, ECHO, Cath to be reviewed  . New Patient (Initial Visit)    HPI:  Patient is a 62 year old male with history of aortic stenosis, hypertension, hyperlipidemia, and long-standing tobacco abuse who has been referred for surgical consultation to discuss treatment options for management of severe symptomatic aortic stenosis and single-vessel coronary artery disease.  Patient states that his primary care physician noted a heart murmur on physical exam several years ago.  Over the past several months the patient has developed symptoms of exertional shortness of breath and chest tightness.  The patient lives a relatively sedentary lifestyle and drives a truck for living.  Despite this he has developed increasing symptoms of exertional chest tightness associated with exertional shortness of breath.  He was seen by his primary care physician and referred for cardiology consultation.  He was evaluated by Dr. Tomie China and promptly scheduled for transthoracic echocardiogram and diagnostic cardiac catheterization.  Echocardiogram performed June 29, 2018 revealed severe, essentially critical, aortic stenosis with preserved left ventricular systolic function.  Peak velocity across the aortic valve measured 4.7 m/s corresponding to mean transvalvular gradient estimated 54 mmHg.  The DVI was quite low at 0.16 with aortic valve area calculated 0.54 cm.  Left ventricular systolic function remains preserved with ejection fraction estimated 60 to 65%.  There was moderate left ventricular hypertrophy with significant diastolic dysfunction.  Left and right heart catheterization was performed the  following day by Dr. Katrinka Blazing.  Catheterization confirmed the presence of critical aortic stenosis with peak to peak and mean transvalvular gradients measured 76 and 64 mmHg respectively, corresponding to aortic valve area calculated 0.91 cm.  The patient was also noted to have significant single-vessel coronary artery disease with 60 to 75% hazy stenosis of the mid right coronary artery.  Right heart pressures were normal.  Cardiothoracic surgical consultation was requested.  Patient is married and lives with his wife in [place].  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  He does not exercise on a regular basis.  He does state that over the past 2 weeks he has been working around the house and doing chores in the yard without significant limitations, although he does get exertional chest tightness and shortness of breath.  Symptoms are always relieved by rest.  He denies any nocturnal angina or resting shortness of breath.  He denies any history of PND, orthopnea, or lower extremity edema.  He has not had palpitations, dizzy spells, nor syncope.  Past Medical History:  Diagnosis Date  . Anal fissure   . Aortic stenosis   . Bilateral carotid bruits   . COPD (chronic obstructive pulmonary disease) (HCC)   . Dyslipidemia   . ED (erectile dysfunction)   . Hemorrhoids   . Hypertension   . Hypogonadism in male   . Leg cramps   . Osteoarthrosis   . Pain of left shoulder region   . Petechiae   . Smoking   . Vitamin D deficiency     Past Surgical History:  Procedure Laterality Date  . APPENDECTOMY    . RIGHT/LEFT HEART CATH AND CORONARY ANGIOGRAPHY N/A 06/30/2018   Procedure: RIGHT/LEFT  HEART CATH AND CORONARY ANGIOGRAPHY;  Surgeon: Lyn Records, MD;  Location: [facility] INVASIVE CV LAB;  Service: Cardiovascular;  Laterality: N/A;    Family History  Problem Relation Age of Onset  . Hypertension Mother   . Hypertension Father     Social  History   Socioeconomic History  . Marital status: Married    Spouse name: Not on file  . Number of children: Not on file  . Years of education: Not on file  . Highest education level: Not on file  Occupational History  . Not on file  Social Needs  . Financial resource strain: Not on file  . Food insecurity:    Worry: Not on file    Inability: Not on file  . Transportation needs:    Medical: Not on file    Non-medical: Not on file  Tobacco Use  . Smoking status: Current Every Day Smoker  . Smokeless tobacco: Never Used  Substance and Sexual Activity  . Alcohol use: Not on file  . Drug use: Not on file  . Sexual activity: Not on file  Lifestyle  . Physical activity:    Days per week: Not on file    Minutes per session: Not on file  . Stress: Not on file  Relationships  . Social connections:    Talks on phone: Not on file    Gets together: Not on file    Attends religious service: Not on file    Active member of club or organization: Not on file    Attends meetings of clubs or organizations: Not on file    Relationship status: Not on file  . Intimate partner violence:    Fear of current or ex partner: Not on file    Emotionally abused: Not on file    Physically abused: Not on file    Forced sexual activity: Not on file  Other Topics Concern  . Not on file  Social History Narrative  . Not on file    Current Outpatient Medications  Medication Sig Dispense Refill  . albuterol (PROVENTIL HFA;VENTOLIN HFA) 108 (90 Base) MCG/ACT inhaler Inhale 2 puffs into the lungs every 6 (six) hours as needed for wheezing or shortness of breath.     Marland Kitchen aspirin EC 81 MG tablet Take 1 tablet (81 mg total) by mouth daily. 90 tablet 3  . atorvastatin (LIPITOR) 40 MG tablet Take 1 tablet (40 mg total) by mouth daily. 30 tablet 11  . lisinopril-hydrochlorothiazide (PRINZIDE,ZESTORETIC) 20-12.5 MG tablet Take 2 tablets by mouth daily.     . valACYclovir (VALTREX) 500 MG tablet Take 500 mg by  mouth daily as needed.      No current facility-administered medications for this visit.     No Known Allergies    Review of Systems:   General:  normal appetite, decreased energy, no weight gain, no weight loss, no fever  Cardiac:  + chest pain with exertion, no chest pain at rest, + SOB with exertion, no resting SOB, no PND, no orthopnea, no palpitations, no arrhythmia, no atrial fibrillation, no LE edema, no dizzy spells, no syncope  Respiratory:  no shortness of breath, no home oxygen, no productive cough, + dry cough, no bronchitis, + wheezing, no hemoptysis, no asthma, no pain with inspiration or cough, no sleep apnea, no CPAP at night  GI:   no difficulty swallowing, no reflux, no frequent heartburn, no hiatal hernia, no abdominal pain, no constipation, no diarrhea, no hematochezia, no hematemesis, no  melena  GU:   no dysuria,  no frequency, no urinary tract infection, no hematuria, no enlarged prostate, no kidney stones, no kidney disease  Vascular:  no pain suggestive of claudication, no pain in feet, no leg cramps, no varicose veins, no DVT, no non-healing foot ulcer  Neuro:   no stroke, no TIA's, no seizures, no headaches, no temporary blindness one eye,  no slurred speech, no peripheral neuropathy, no chronic pain, no instability of gait, no memory/cognitive dysfunction  Musculoskeletal: + arthritis, no joint swelling, no myalgias, no difficulty walking, normal mobility   Skin:   no rash, no itching, no skin infections, no pressure sores or ulcerations  Psych:   no anxiety, no depression, no nervousness, no unusual recent stress  Eyes:   no blurry vision, no floaters, no recent vision changes, + wears glasses for reading only  ENT:   no hearing loss, no loose or painful teeth, no dentures, last saw dentist 3 years ago  Hematologic:  no easy bruising, no abnormal bleeding, no clotting disorder, no frequent epistaxis  Endocrine:  no diabetes, does not check CBG's at  home           Physical Exam:   BP (!) 142/78 (BP Location: Right Arm, Patient Position: Sitting, Cuff Size: Normal)   Pulse 62   Resp 18   Ht 5\' 11"  (1.803 m)   Wt 227 lb 3.2 oz (103.1 kg)   SpO2 97% Comment: RA  BMI 31.69 kg/m   General:    well-appearing  HEENT:  Unremarkable   Neck:   no JVD, no bruits, no adenopathy   Chest:   clear to auscultation, symmetrical breath sounds, no wheezes, no rhonchi   CV:   RRR, grade III/VI crescendo/decrescendo murmur heard best at RSB,  no diastolic murmur  Abdomen:  soft, non-tender, no masses   Extremities:  warm, well-perfused, pulses diminished but palpable, no LE edema  Rectal/GU  Deferred  Neuro:   Grossly non-focal and symmetrical throughout  Skin:   Clean and dry, no rashes, no breakdown   Diagnostic Tests:  Transthoracic Echocardiography  Patient:    [place], Nordquist MR #:       [id number] Study Date: 06/29/2018 Gender:     M Age:        73 Height:     180.3 cm Weight:     103.6 kg BSA:        2.31 m^2 Pt. Status: Room:   Ethelle Lyon, Mila Homer  ATTENDING    Belva Crome, MD  ORDERING     Belva Crome, MD  REFERRING    Belva Crome, MD  SONOGRAPHER  Lanae Crumbly, RDCS  PERFORMING   [facility], [place]  cc:  ------------------------------------------------------------------- LV EF: 60% -   65%  ------------------------------------------------------------------- Indications:      Chest pain 786.51.  ------------------------------------------------------------------- History:   PMH:   Aortic stenosis.  Risk factors:  Current tobacco use. Hypertension. Dyslipidemia.  ------------------------------------------------------------------- Study Conclusions  - Left ventricle: The cavity size was normal. Wall thickness was   increased in a pattern of moderate LVH. Systolic function was   normal. The estimated ejection fraction was in the range of 60%   to 65%. Wall motion was normal; there were  no regional wall   motion abnormalities. - Aortic valve: There was critical stenosis. Valve area (VTI): 0.54   cm^2. Valve area (Vmax): 0.56 cm^2. Valve area (Vmean): 0.53   cm^2.  Impressions:  - 1.  Critical aortic stenosis. Please see detailed measurements in   the body of the report.   2. Concentric left ventricular hypertrophy with preserved left   ventricular systolic function.  ------------------------------------------------------------------- Study data:  No prior study was available for comparison.  Study status:  Routine.  Procedure:  The patient reported no pain pre or post test. Transthoracic echocardiography. Image quality was adequate.  Study completion:  There were no complications. Transthoracic echocardiography.  M-mode, complete 2D, spectral Doppler, and color Doppler.  Birthdate:  Patient birthdate: September 20, 1956.  Age:  Patient is 62 yr old.  Sex:  Gender: male. BMI: 31.9 kg/m^2.  Blood pressure:     127/62  Patient status: Outpatient.  Study date:  Study date: 06/29/2018. Study time: 10:23 AM.  Location:  Echo laboratory.  -------------------------------------------------------------------  ------------------------------------------------------------------- Left ventricle:  The cavity size was normal. Wall thickness was increased in a pattern of moderate LVH. Systolic function was normal. The estimated ejection fraction was in the range of 60% to 65%. Wall motion was normal; there were no regional wall motion abnormalities.  ------------------------------------------------------------------- Aortic valve:   Doppler:   There was critical stenosis.   There was no regurgitation.    VTI ratio of LVOT to aortic valve: 0.16. Valve area (VTI): 0.54 cm^2. Indexed valve area (VTI): 0.23 cm^2/m^2. Peak velocity ratio of LVOT to aortic valve: 0.16. Valve area (Vmax): 0.56 cm^2. Indexed valve area (Vmax): 0.24 cm^2/m^2. Mean velocity ratio of LVOT to aortic valve:  0.15. Valve area (Vmean): 0.53 cm^2. Indexed valve area (Vmean): 0.23 cm^2/m^2.    Mean gradient (S): 54 mm Hg. Peak gradient (S): 88 mm Hg.  ------------------------------------------------------------------- Aorta:  Aortic root: The aortic root was normal in size.  ------------------------------------------------------------------- Mitral valve:   Structurally normal valve.   Mobility was not restricted.  Doppler:  Transvalvular velocity was within the normal range. There was no evidence for stenosis. There was no regurgitation.    Valve area by pressure half-time: 3.38 cm^2. Indexed valve area by pressure half-time: 1.46 cm^2/m^2.    Peak gradient (D): 5 mm Hg.  ------------------------------------------------------------------- Left atrium:  The atrium was normal in size.  ------------------------------------------------------------------- Right ventricle:  The cavity size was normal. Wall thickness was normal. Systolic function was normal.  ------------------------------------------------------------------- Pulmonic valve:    Doppler:  Transvalvular velocity was within the normal range. There was no evidence for stenosis.  ------------------------------------------------------------------- Tricuspid valve:   Structurally normal valve.    Doppler: Transvalvular velocity was within the normal range. There was no regurgitation.  ------------------------------------------------------------------- Pulmonary artery:   The main pulmonary artery was normal-sized. Systolic pressure was within the normal range.  ------------------------------------------------------------------- Right atrium:  The atrium was normal in size.  ------------------------------------------------------------------- Pericardium:  There was no pericardial effusion.  ------------------------------------------------------------------- Systemic veins: Inferior vena cava: The vessel was normal in  size.  ------------------------------------------------------------------- Measurements   Left ventricle                           Value          Reference  LV ID, ED, PLAX chordal                  51    mm       43 - 52  LV ID, ES, PLAX chordal                  32    mm       23 -  38  LV fx shortening, PLAX chordal           37    %        >=29  LV PW thickness, ED                      14    mm       ----------  IVS/LV PW ratio, ED                      1              <=1.3  Stroke volume, 2D                        69    ml       ----------  Stroke volume/bsa, 2D                    30    ml/m^2   ----------  LV e&', lateral                           6.96  cm/s     ----------  LV E/e&', lateral                         16.52          ----------  LV e&', medial                            5.22  cm/s     ----------  LV E/e&', medial                          22.03          ----------  LV e&', average                           6.09  cm/s     ----------  LV E/e&', average                         18.88          ----------    Ventricular septum                       Value          Reference  IVS thickness, ED                        14    mm       ----------    LVOT                                     Value          Reference  LVOT ID, S                               21    mm       ----------  LVOT area  3.46  cm^2     ----------  LVOT peak velocity, S                    75.4  cm/s     ----------  LVOT mean velocity, S                    54    cm/s     ----------  LVOT VTI, S                              20    cm       ----------  LVOT peak gradient, S                    2     mm Hg    ----------    Aortic valve                             Value          Reference  Aortic valve peak velocity, S            469   cm/s     ----------  Aortic valve mean velocity, S            355   cm/s     ----------  Aortic valve VTI, S                      127   cm        ----------  Aortic mean gradient, S                  54    mm Hg    ----------  Aortic peak gradient, S                  88    mm Hg    ----------  VTI ratio, LVOT/AV                       0.16           ----------  Aortic valve area, VTI                   0.54  cm^2     ----------  Aortic valve area/bsa, VTI               0.23  cm^2/m^2 ----------  Velocity ratio, peak, LVOT/AV            0.16           ----------  Aortic valve area, peak velocity         0.56  cm^2     ----------  Aortic valve area/bsa, peak              0.24  cm^2/m^2 ----------  velocity  Velocity ratio, mean, LVOT/AV            0.15           ----------  Aortic valve area, mean velocity         0.53  cm^2     ----------  Aortic valve area/bsa, mean              0.23  cm^2/m^2 ----------  velocity    Aorta  Value          Reference  Aortic root ID, ED                       34    mm       ----------  Ascending aorta ID, A-P, S               38    mm       ----------  Descending aorta ID, proximal            29    mm       ----------    Left atrium                              Value          Reference  LA ID, A-P, ES                           42    mm       ----------  LA ID/bsa, A-P                           1.82  cm/m^2   <=2.2  LA volume, S                             74.8  ml       ----------  LA volume/bsa, S                         32.4  ml/m^2   ----------  LA volume, ES, 1-p A4C                   61.2  ml       ----------  LA volume/bsa, ES, 1-p A4C               26.5  ml/m^2   ----------  LA volume, ES, 1-p A2C                   81.6  ml       ----------  LA volume/bsa, ES, 1-p A2C               35.4  ml/m^2   ----------    Mitral valve                             Value          Reference  Mitral E-wave peak velocity              115   cm/s     ----------  Mitral A-wave peak velocity              75.7  cm/s     ----------  Mitral deceleration time                 222   ms        150 - 230  Mitral pressure half-time                65    ms       ----------  Mitral peak gradient, D  5     mm Hg    ----------  Mitral E/A ratio, peak                   1.5            ----------  Mitral valve area, PHT, DP               3.38  cm^2     ----------  Mitral valve area/bsa, PHT, DP           1.46  cm^2/m^2 ----------    Pulmonary arteries                       Value          Reference  PA pressure, S, DP                       21    mm Hg    <=30    Tricuspid valve                          Value          Reference  Tricuspid regurg peak velocity           179   cm/s     ----------  Tricuspid peak RV-RA gradient            13    mm Hg    ----------    Right atrium                             Value          Reference  RA ID, S-I, ES, A4C               (H)    50.7  mm       34 - 49  RA area, ES, A4C                         15.5  cm^2     8.3 - 19.5  RA volume, ES, A/L                       38.1  ml       ----------  RA volume/bsa, ES, A/L                   16.5  ml/m^2   ----------    Systemic veins                           Value          Reference  Estimated CVP                            8     mm Hg    ----------    Right ventricle                          Value          Reference  TAPSE  21.9  mm       ----------  RV pressure, S, DP                       21    mm Hg    <=30  RV s&', lateral, S                        13.8  cm/s     ----------  Legend: (L)  and  (H)  mark values outside specified reference range.  ------------------------------------------------------------------- Prepared and Electronically Authenticated by  Belva Crome, MD 2019-09-16T12:39:55   RIGHT/LEFT HEART CATH AND CORONARY ANGIOGRAPHY  Conclusion    Critical aortic stenosis, calculated valve area 0.91 cm.  Peak to peak gradient 76 mmHg, mean gradient 64 mmHg.  Intermediate stenosis mid RCA in the 60 to 75% range.  Widely  patent left coronary system.  RECOMMENDATIONS:   Severe aortic stenosis documented by echo and invasive evaluation.  Referred to the aortic valve clinic for consideration of low risk TAVR with hybrid RCA stent (question timing) versus SAVR and RCA bypass.  Start high intensity statin therapy.  Avoid nonsteroidal therapy.  Continue aspirin.  Avoids activities that cause chest discomfort.  Recommend Aspirin 81mg  daily for moderate CAD.  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 Intravenous sedation with Versed and fentanyl was administered. 1% Xylocaine was infiltrated to achieve local analgesia. Using real-time vascular ultrasound, a double wall stick with an angiocath was utilized to obtain intra-arterial access. A VUS image was saved for the permanent record.The modified Seldinger technique was used to place a 34F " Slender" sheath in the right radial artery. Weight based heparin was administered. Coronary angiography was done using 5 F catheters. Right coronary angiography was performed with a JR4. Left ventricular hemodymic recordings and angiography was done using the JR 4 catheter and hand injection. Left coronary angiography was performed with a JL 3.5 cm.  Right heart catheterization was performed by exchanging a previously placed antecubital IV angio-cath for a 5 French Slender sheath. 1% Xylocaine was used to locally nesthetize the area around the IV site. The IV catheter was wired using an .018 guidewire. The modified Seldinger technique was used to place the 5 [place] sheath. Double glove technique was used to enhance sterility. After sheath insertion, right heart cath was performed using a 5 French balloon tipped catheter and fluoroscopic guidance.  Pressures were recorded in each chamber and in the pulmonary capillary wedge position.. The main pulmonary artery O2 saturation was sampled.   Hemostasis was achieved using a pneumatic band.  During this procedure the patient is administered a total of Versed 1.5 mg and Fentanyl 25 mg to achieve and maintain moderate conscious sedation. The patient's heart rate, blood pressure, and oxygen saturation are monitored continuously during the procedure. The period of conscious sedation is 37 minutes, of which I was present face-to-face 100% of this time.   Estimated blood loss <50 mL.  During this procedure the patient was administered the following to achieve and maintain moderate conscious sedation: Versed 1.5 mg, Fentanyl 25 mcg, while the patient's heart rate, blood pressure, and oxygen saturation were continuously monitored. The period of conscious sedation was 37 minutes, of which I was present face-to-face 100% of  this time.  Coronary Findings   Diagnostic  Dominance: Right  Left Anterior Descending  Ost LAD to Prox LAD lesion 25% stenosed  Ost LAD to Prox LAD lesion is 25% stenosed.  Right Coronary Artery  Mid RCA lesion 65% stenosed  Mid RCA lesion is 65% stenosed.  Intervention   No interventions have been documented.  Right Heart   Right Heart Pressures Hemodynamic findings consistent with mild pulmonary hypertension. Elevated LV EDP consistent with volume overload.  Left Heart   Left Ventricle LV end diastolic pressure is moderately elevated.  Aortic Valve There is severe aortic valve stenosis. The aortic valve is calcified. There is restricted aortic valve motion.  Coronary Diagrams   Diagnostic Diagram       Implants    No implant documentation for this case.  MERGE Images   Show images for CARDIAC CATHETERIZATION   Link to Procedure Log   Procedure Log    Hemo Data    Most Recent Value  Fick Cardiac Output 6.16 L/min  Fick Cardiac Output Index 2.75  (L/min)/BSA  Aortic Mean Gradient 64.38 mmHg  Aortic Peak Gradient 76 mmHg  Aortic Valve Area 0.91  Aortic Value Area Index 0.41 cm2/BSA  RA A Wave 13 mmHg  RA V Wave 10 mmHg  RA Mean 9 mmHg  RV Systolic Pressure 35 mmHg  RV Diastolic Pressure 5 mmHg  RV EDP 12 mmHg  PA Systolic Pressure 37 mmHg  PA Diastolic Pressure 14 mmHg  PA Mean 23 mmHg  PW A Wave 18 mmHg  PW V Wave 16 mmHg  PW Mean 13 mmHg  AO Systolic Pressure 108 mmHg  AO Diastolic Pressure 55 mmHg  AO Mean 73 mmHg  LV Systolic Pressure 181 mmHg  LV Diastolic Pressure 10 mmHg  LV EDP 21 mmHg  AOp Systolic Pressure 102 mmHg  AOp Diastolic Pressure 55 mmHg  AOp Mean Pressure 72 mmHg  LVp Systolic Pressure 178 mmHg  LVp Diastolic Pressure 11 mmHg  LVp EDP Pressure 19 mmHg  QP/QS 1  TPVR Index 8.35 HRUI  TSVR Index 26.85 HRUI  PVR SVR Ratio 0.15  TPVR/TSVR Ratio 0.31    STS Risk Calculator  Procedure: AVR + CAB CALCULATE   Risk of Mortality:  0.661% Renal Failure:  0.745% Permanent Stroke:  0.747% Prolonged Ventilation:  4.140% DSW Infection:  0.239% Reoperation:  2.486% Morbidity or Mortality:  6.765% Short Length of Stay:  59.103% Long Length of Stay:  2.876%    Impression:  Patient has stage D severe symptomatic aortic stenosis with single-vessel coronary artery disease.  He presents with a several month history of worsening symptoms of exertional chest tightness and shortness of breath consistent with stable angina pectoris and chronic diastolic congestive heart failure, New York Heart Association functional class II.  I have personally reviewed the patient's recent transthoracic echocardiogram and diagnostic cardiac catheterization.  Echocardiogram confirmed the presence of severe aortic stenosis.  The aortic valve appears trileaflet although a functionally bicuspid valve is possible.  There is severe thickening, calcification, and restricted leaflet mobility involving all 3 leaflets.  Peak  velocity across the aortic valve measured 4.7 m/s corresponding to mean transvalvular gradient greater than 50 mmHg.  Left ventricular systolic function remains normal.  There is left ventricular hypertrophy with significant diastolic dysfunction.  Catheterization confirmed the presence of severe aortic stenosis and revealed significant single-vessel coronary artery disease with stenosis of the mid right coronary artery.  Risks associated with conventional surgical aortic valve replacement and coronary  artery bypass grafting should be reasonably low.  Given the patient's relatively young age, conventional surgery would probably be associated with best long-term outcome.     Plan:  The patient and his wife were counseled at length regarding treatment alternatives for management of severe symptomatic aortic stenosis and coronary artery disease. Alternative approaches such as conventional aortic valve replacement with coronary artery bypass grafting, transcatheter aortic valve replacement with or without PCI and stenting of the right coronary artery, and continued medical therapy without intervention were compared and contrasted at length.  The risks associated with conventional surgical aortic valve replacement were discussed in detail, as were expectations for post-operative convalescence.  Issues specific to transcatheter aortic valve replacement were discussed including questions about long term valve durability, the potential for paravalvular leak, possible increased risk of need for permanent pacemaker placement, and other technical complications related to the procedure itself.  Long-term prognosis with medical therapy was discussed. This discussion was placed in the context of the patient's own specific clinical presentation and past medical history.  All of their questions have been addressed.    As a next step the patient will undergo cardiac gated CT angiogram of the heart and CT angiogram of the  aorta and iliac vessels to evaluate the anatomy of the aortic valve and aortic root as well as the feasibility of transcatheter aortic valve replacement.  Assuming that CT angiogram does not reveal any complicating features, the patient desires to proceed with conventional surgical aortic valve replacement and coronary artery bypass grafting in the near future.  We tentatively plan for surgery on July 17, 2018.  Prior to surgery the patient will be referred for dental service consultation for routine examination and cleaning.  The patient will return to our office prior to surgery on August 03, 2018.    I spent in excess of 90 minutes during the conduct of this office consultation and >50% of this time involved direct face-to-face encounter with the patient for counseling and/or coordination of their care.     Salvatore Decent. Cornelius Moras, MD 07/20/2018 11:01 AM

## 2018-07-21 ENCOUNTER — Encounter: Payer: Self-pay | Admitting: *Deleted

## 2018-07-22 ENCOUNTER — Telehealth (HOSPITAL_COMMUNITY): Payer: Self-pay

## 2018-07-22 NOTE — Telephone Encounter (Signed)
Left message on machine for patient to return call to Dental Medicine to schedule New Patient appointment. Blake Griffin

## 2018-07-23 DIAGNOSIS — Z23 Encounter for immunization: Secondary | ICD-10-CM | POA: Diagnosis not present

## 2018-07-24 ENCOUNTER — Inpatient Hospital Stay: Admit: 2018-07-24 | Payer: Commercial Managed Care - PPO | Admitting: Thoracic Surgery (Cardiothoracic Vascular Surgery)

## 2018-07-24 SURGERY — REPLACEMENT, AORTIC VALVE, OPEN
Anesthesia: General | Site: Chest

## 2018-07-27 ENCOUNTER — Ambulatory Visit: Payer: Commercial Managed Care - PPO | Admitting: Cardiology

## 2018-07-28 ENCOUNTER — Ambulatory Visit (HOSPITAL_COMMUNITY): Payer: Self-pay | Admitting: Dentistry

## 2018-07-28 ENCOUNTER — Encounter (HOSPITAL_COMMUNITY): Payer: Self-pay | Admitting: Dentistry

## 2018-07-28 ENCOUNTER — Encounter (HOSPITAL_COMMUNITY): Payer: Self-pay | Admitting: *Deleted

## 2018-07-28 ENCOUNTER — Other Ambulatory Visit: Payer: Self-pay

## 2018-07-28 VITALS — BP 151/77 | HR 56 | Temp 98.0°F

## 2018-07-28 DIAGNOSIS — M264 Malocclusion, unspecified: Secondary | ICD-10-CM

## 2018-07-28 DIAGNOSIS — I25119 Atherosclerotic heart disease of native coronary artery with unspecified angina pectoris: Secondary | ICD-10-CM

## 2018-07-28 DIAGNOSIS — Z01818 Encounter for other preprocedural examination: Secondary | ICD-10-CM

## 2018-07-28 DIAGNOSIS — K0889 Other specified disorders of teeth and supporting structures: Secondary | ICD-10-CM

## 2018-07-28 DIAGNOSIS — K0530 Chronic periodontitis, unspecified: Secondary | ICD-10-CM

## 2018-07-28 DIAGNOSIS — K045 Chronic apical periodontitis: Secondary | ICD-10-CM

## 2018-07-28 DIAGNOSIS — M2632 Excessive spacing of fully erupted teeth: Secondary | ICD-10-CM

## 2018-07-28 DIAGNOSIS — I350 Nonrheumatic aortic (valve) stenosis: Secondary | ICD-10-CM

## 2018-07-28 DIAGNOSIS — K06010 Localized gingival recession, unspecified: Secondary | ICD-10-CM

## 2018-07-28 DIAGNOSIS — K08409 Partial loss of teeth, unspecified cause, unspecified class: Secondary | ICD-10-CM

## 2018-07-28 DIAGNOSIS — K036 Deposits [accretions] on teeth: Secondary | ICD-10-CM

## 2018-07-28 DIAGNOSIS — K031 Abrasion of teeth: Secondary | ICD-10-CM

## 2018-07-28 DIAGNOSIS — K011 Impacted teeth: Secondary | ICD-10-CM

## 2018-07-28 DIAGNOSIS — F40232 Fear of other medical care: Secondary | ICD-10-CM

## 2018-07-28 NOTE — Progress Notes (Signed)
DENTAL CONSULTATION  Date of Consultation:  07/28/2018 Patient Name:   Blake Griffin Date of Birth:   14-Jan-1956 Medical Record Number: [id number]  VITALS: BP (!) 151/77 (BP Location: Right Arm)   Pulse (!) 56   Temp 98 F (36.7 C)   CHIEF COMPLAINT: Patient referred by Dr. Cornelius Moras for a dental consultation.  HPI: AMY BELLOSO is a 62 year old male recently diagnosed with severe aortic stenosis and coronary artery disease. Patient with anticipated aortic valve replacement and coronary artery bypass graft procedure with Dr. Cornelius Moras. Patient is now seen as part of a pre-heart valve surgery dental protocol examination to rule out dental infection that may affect the patient's systemic health and anticipated heart valve surgery.  The patient currently denies acute toothaches, swellings, or abscesses. Patient was last seen by a dentist in [place], [place] 4-5 years ago. Patient had tooth pulled at that time with no complications. Patient denies having partial dentures. 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  PROBLEM LIST: Patient Active Problem List   Diagnosis Date Noted  . Aortic stenosis 06/22/2018    Priority: High  . Coronary artery disease involving native coronary artery of native heart with angina pectoris (HCC)   . Cigarette smoker 06/29/2018  . Erectile dysfunction 06/29/2018  . Angina pectoris (HCC) 06/29/2018  . Hypertension 06/22/2018  . Benign essential HTN 08/28/2017  . COPD (chronic obstructive pulmonary disease) (HCC) 08/28/2017  . Vitamin D deficiency 08/28/2017  . Light headed 08/28/2017    PMH: Past Medical History:  Diagnosis Date  . Anal fissure   . Aortic stenosis   . Bilateral carotid bruits   . COPD (chronic obstructive pulmonary disease) (HCC)   . Coronary artery disease involving native coronary artery of native heart with angina pectoris (HCC)   . Dyslipidemia   . ED (erectile dysfunction)   . Hemorrhoids   . Hypertension   . Hypogonadism in male    . Leg cramps   . Osteoarthrosis   . Pain of left shoulder region   . Petechiae   . Smoking   . Vitamin D deficiency     PSH: Past Surgical History:  Procedure Laterality Date  . APPENDECTOMY    . RIGHT/LEFT HEART CATH AND CORONARY ANGIOGRAPHY N/A 06/30/2018   Procedure: RIGHT/LEFT HEART CATH AND CORONARY ANGIOGRAPHY;  Surgeon: Lyn Records, MD;  [facility];  Service: Cardiovascular;  Laterality: N/A;    ALLERGIES: No Known Allergies  MEDICATIONS: Current Outpatient Medications  Medication Sig Dispense Refill  . albuterol (PROVENTIL HFA;VENTOLIN HFA) 108 (90 Base) MCG/ACT inhaler Inhale 2 puffs into the lungs every 6 (six) hours as needed for wheezing or shortness of breath.     Marland Kitchen aspirin EC 81 MG tablet Take 1 tablet (81 mg total) by mouth daily. 90 tablet 3  . atorvastatin (LIPITOR) 40 MG tablet Take 1 tablet (40 mg total) by mouth daily. 30 tablet 11  . lisinopril-hydrochlorothiazide (PRINZIDE,ZESTORETIC) 20-12.5 MG tablet Take 2 tablets by mouth daily.     . valACYclovir (VALTREX) 500 MG tablet Take 500 mg by mouth daily as needed (cold sores).      No current facility-administered medications for this visit.      LABS: Lab Results  Component Value Date   WBC 7.0 06/29/2018   HGB 14.2 06/29/2018   HCT 41.8 06/29/2018   MCV 94 06/29/2018   PLT 226 06/29/2018      Component Value Date/Time   NA 140 06/29/2018 4098  K 4.1 06/29/2018 0938   CL 105 06/29/2018 0938   CO2 23 06/29/2018 0938   GLUCOSE 104 (H) 06/29/2018 0938   BUN 18 06/29/2018 0938   CREATININE 0.74 (L) 06/29/2018 0938   CALCIUM 8.9 06/29/2018 0938   GFRNONAA 97 06/29/2018 0938   GFRAA 113 06/29/2018 0938   No results found for: INR, PROTIME No results found for: PTT  SOCIAL HISTORY: Social History   Socioeconomic History  . Marital status: Married    Spouse name: Not on file  . Number of children: 5  . Years of education: Not on file  . Highest education level: Not  on file  Occupational History  . Not on file  Social Needs  . Financial resource strain: Not on file  . Food insecurity:    Worry: Not on file    Inability: Not on file  . Transportation needs:    Medical: Not on file    Non-medical: Not on file  Tobacco Use  . Smoking status: Current Every Day Smoker    Packs/day: 1.00    Years: 50.00    Pack years: 50.00  . Smokeless tobacco: Never Used  Substance and Sexual Activity  . Alcohol use: Yes    Alcohol/week: 7.0 standard drinks    Types: 7 Shots of liquor per week    Comment: Bacardi rum  . Drug use: Never  . Sexual activity: Not on file  Lifestyle  . Physical activity:    Days per week: Not on file    Minutes per session: Not on file  . Stress: Not on file  Relationships  . Social connections:    Talks on phone: Not on file    Gets together: Not on file    Attends religious service: Not on file    Active member of club or organization: Not on file    Attends meetings of clubs or organizations: Not on file    Relationship status: Not on file  . Intimate partner violence:    Fear of current or ex partner: Not on file    Emotionally abused: Not on file    Physically abused: Not on file    Forced sexual activity: Not on file  Other Topics Concern  . Not on file  Social History Narrative  . Not on file    FAMILY HISTORY: Family History  Problem Relation Age of Onset  . Hypertension Mother   . Hypertension Father     REVIEW OF SYSTEMS: Reviewed with the patient as per History of present illness. 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  DENTAL HISTORY: CHIEF COMPLAINT: Patient referred by Dr. Cornelius Moras for a dental consultation.  HPI: Blake Griffin is a 62 year old male recently diagnosed with severe aortic stenosis and coronary artery disease. Patient with anticipated aortic valve replacement and coronary artery bypass graft procedure with Dr. Cornelius Moras. Patient is now seen as part of a pre-heart valve  surgery dental protocol examination to rule out dental infection that may affect the patient's systemic health and anticipated heart valve surgery.  The patient currently denies acute toothaches, swellings, or abscesses. Patient was last seen by a dentist in [place], [place] 4-5 years ago. Patient had tooth pulled at that time with no complications. Patient denies having partial dentures. 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  DENTAL EXAMINATION: GENERAL:  The patient is a well-developed, well-nourished male in no acute distress. HEAD AND NECK:  There is no palpable neck lymphadenopathy. The  patient denies acute TMJ symptoms. INTRAORAL EXAM:  Patient has normal saliva. There is no evidence of oral abscess formation. DENTITION: The patient is missing tooth numbers 2, 3, 14, 15, 17, 18, and 30. Tooth #1 is impacted. There is mandibular anterior incisal attrition. PERIODONTAL:  The patient has chronic periodontitis with plaque and calculus accumulations, gingival recession, and moderate bone loss.ooth mobility as per dental charting form. DENTAL CARIES/SUBOPTIMAL RESTORATIONS: Dental caries as per dental charting form. Multiple flexure lesions noted. ENDODONTIC:  The patient currently denies acute pulpitis symptoms. There is periapical pathology at the apex of tooth #5. CROWN AND BRIDGE:  There are no crown or bridge restorations. PROSTHODONTIC: The  patient denies having partial dentures. OCCLUSION:  The patient has a poor occlusal scheme secondary to multiple missing teeth, supra-eruption and drifting of the unopposed teeth into the edentulous areas, multiple malpositioned teeth,multiple diastemas, and lack of replacement missing teeth with dental prostheses.  RADIOGRAPHIC INTERPRETATION: An orthopantogram was taken and supplemented with a full series of dental radiographs. There are multiple missing teeth. There is an impacted tooth #1. There is moderate bone loss noted. Radiographic calculus  is noted. There is evidence of mandibular anterior incisal attrition. There is periapical radiolucency at the apex of tooth #5.  ASSESSMENTS: 1. Severe aortic stenosis and coronary artery disease 2. Pre-heart valve surgery dental protocol 3. Chronic periodontitis with bone loss 4. Generalized gingival recession 5. Accretions 6. Tooth mobility 7. Chronic apical periodontitis 8. Multiple flexure lesions 9. Multiple missing teeth 10. Impacted tooth #1 11. Multiple diastemas 12. Supra-eruption and drifting of the unopposed teeth into the edentulous areas 13. Multiple malpositioned teeth 14. Poor occlusal scheme and malocclusion 15. Minimal anterior incisal attrition 16. Risk for complications with anticipated invasive dental procedures in the operating with general anesthesia due to cardiovascular compromise. 17. Dental phobia  PLAN/RECOMMENDATIONS: 1. I discussed the risks, benefits, and complications of various treatment options with the patient in relationship to his medical and dental conditions, anticipated heart valve surgery and coronary artery bypass graft procedure, and risk for endocarditis.  We discussed various treatment options to include no treatment, multiple extractions with alveoloplasty, pre-prosthetic surgery as indicated, periodontal therapy, dental restorations, root canal therapy, crown and bridge therapy, implant therapy, and replacement of missing teeth as indicated. The patient currently wishes to proceed with traction of tooth #5 with alveoloplasty and gross to be in remaining dentition the operative room with general anesthesia. This has been scheduled for Thursday, 07/30/2018 at 7:30 AM. The patient will then follow-up with the primary dentist of his choice for continued periodontal maintenance procedures as indicated after the heart valve surgery.   2. Discussion of findings with medical team and coordination of future medical and dental care as needed.  I spent in  excess of  120 minutes during the conduct of this consultation and >50% of this time involved direct face-to-face encounter for counseling and/or coordination of the patient's care.    Charlynne Pander, DDS

## 2018-07-28 NOTE — Patient Instructions (Signed)
[facility]    Department of Dental Medicine     DR. KULINSKI      HEART VALVES AND MOUTH CARE:  FACTS:   If you have any infection in your mouth, it can infect your heart valve.  If you heart valve is infected, you will be seriously ill.  Infections in the mouth can be SILENT and do not always cause pain.  Examples of infections in the mouth are gum disease, dental cavities, and abscesses.  Some possible signs of infection are: Bad breath, bleeding gums, or teeth that are sensitive to sweets, hot, and/or cold. There are many other signs as well.  WHAT YOU HAVE TO DO:   Brush your teeth after meals and at bedtime. Spend at least 2 minutes brushing well, especially behind your back teeth and all around your teeth that stand alone. Brush at the gumline also.  Do not go to bed without brushing your teeth and flossing.  If you gums bleed when you brush or floss, do NOT stop brushing or flossing. It usually means that your gums need more attention and better cleaning.   If your Dentist or Dr. Kulinski gave you a prescription mouthwash to use, make sure to use it as directed. If you run out of the medication, get a refill at the pharmacy.   If you were given any other medications or directions by your Dentist, please follow them. If you did not understand the directions or forget what you were told, please call. We will be happy to refresh her memory.  If you need antibiotics before dental procedures, make sure you take them one hour prior to every dental visit as directed.   Get a dental checkup every 4-6 months in order to keep your mouth healthy, or to find and treat any new infection. You will most likely need your teeth cleaned or gums treated at the same time.  If you are not able to come in for your scheduled appointment, call your Dentist as soon as possible to reschedule.  If you have a problem in between dental visits, call your Dentist.  

## 2018-07-29 NOTE — Progress Notes (Signed)
Anesthesia Chart Review: SAME DAY WORK-UP  Case:  [id number] Date/Time:  07/30/18 0715   Procedure:  DENTAL RESTORATION/EXTRACTIONS (N/A )   Anesthesia type:  General   Pre-op diagnosis:  severe aortic stenosis, periodontitis   [facility] / [facility]   Surgeon:  Charlynne Pander, DDS      DISCUSSION: Patient is a 62 year old male scheduled for the above procedure. He is tentatively scheduled for CABG/AVR on 08/06/18.   History includes smoking, CAD, severe AS, COPD, HTN, dyslipidemia, dyspnea. Carotid bruits are listed in history, but no carotid bruits documented on 06/29/18 exam by cardiologist Dr. Tomie China (has pre-CABG carotid Duplex scheduled for 08/04/18). BMI is consistent with mild obesity.  H&P per Dr. Cornelius Moras 07/20/18 with evaluation by Dr. Kristin Bruins on 07/28/18. Per 07/29/18 note, "The patient currently wishes to proceed with traction of tooth #5 with alveoloplasty and gross to be in remaining dentition the operative room with general anesthesia." ased on currently available information, I would anticipate that he can proceed if no acute changes. He is for updated labs on arrival.    PROVIDERS: Wilmer Floor., MD is PCP Belva Crome, MD is primary cardiologist Tressie Stalker, MD is CT surgeon   LABS: He will need updated labs prior to surgery. Most recent labs show:  Lab Results  Component Value Date   WBC 7.0 06/29/2018   HGB 14.2 06/29/2018   HCT 41.8 06/29/2018   PLT 226 06/29/2018   GLUCOSE 104 (H) 06/29/2018   ALT 20 06/29/2018   AST 13 06/29/2018   NA 140 06/29/2018   K 4.1 06/29/2018   CL 105 06/29/2018   CREATININE 0.74 (L) 06/29/2018   BUN 18 06/29/2018   CO2 23 06/29/2018   TSH 1.090 06/29/2018    IMAGES: He is scheduled for CTA chest/abd/pelvis and CT coronary on 07/31/18 as part of AS work-up.   CXR 06/29/18 [facility]):  IMPRESSION: Chronic bronchitic changes. No acute pneumonia nor CHF. Thoaracic aortic atherosclerosis.   EKG:  06/29/18: NSR, T wave abnormality, consider lateral ischemia.   CV: Pre-CABG Dopplers are scheduled for 08/04/18.   Cardiac cath 06/30/18:  Critical aortic stenosis, calculated valve area 0.91 cm.  Peak to peak gradient 76 mmHg, mean gradient 64 mmHg.  Intermediate stenosis mid RCA in the 60 to 75% range.  Widely patent left coronary system. RECOMMENDATIONS:  Severe aortic stenosis documented by echo and invasive evaluation.  Referred to the aortic valve clinic for consideration of low risk TAVR with hybrid RCA stent (question timing) versus SAVR and RCA bypass.  Start high intensity statin therapy.  Avoid nonsteroidal therapy.  Continue aspirin.  Avoids activities that cause chest discomfort. Recommend Aspirin 81mg  daily for moderate CAD.  Echo 06/29/18: Study Conclusions - Left ventricle: The cavity size was normal. Wall thickness was   increased in a pattern of moderate LVH. Systolic function was   normal. The estimated ejection fraction was in the range of 60%   to 65%. Wall motion was normal; there were no regional wall   motion abnormalities. - Aortic valve:   Doppler:   There was critical stenosis.   There was no regurgitation.    VTI ratio of LVOT to aortic valve: 0.16. Valve area (VTI): 0.54 cm^2. Indexed valve area (VTI): 0.23 cm^2/m^2. Peak velocity ratio of LVOT to aortic valve: 0.16. Valve area (Vmax): 0.56 cm^2. Indexed valve area (Vmax): 0.24 cm^2/m^2. Mean velocity ratio of LVOT to aortic valve: 0.15. Valve area (Vmean):  0.53 cm^2. Indexed valve area (Vmean): 0.23 cm^2/m^2.    Mean gradient (S): 54 mm Hg. Peak gradient (S): 88 mm Hg. Impressions: - 1. Critical aortic stenosis. Please see detailed measurements in   the body of the report.   2. Concentric left ventricular hypertrophy with preserved left   ventricular systolic function.   Past Medical History:  Diagnosis Date  . Anal fissure   . Aortic stenosis   . Bilateral carotid bruits   . COPD (chronic  obstructive pulmonary disease) (HCC)   . Coronary artery disease involving native coronary artery of native heart with angina pectoris (HCC)   . Dyslipidemia   . Dyspnea    when I first get started moving  around -  then it goes away  . ED (erectile dysfunction)   . Heart murmur   . Hemorrhoids   . Hypertension   . Hypogonadism in male   . Leg cramps   . Osteoarthrosis   . Pain of left shoulder region   . Petechiae   . Pneumonia 2016 ish  . Smoking   . Vitamin D deficiency     Past Surgical History:  Procedure Laterality Date  . APPENDECTOMY    . COLONOSCOPY W/ POLYPECTOMY     x 2  . Lung Biospy Right    lower lung  . RIGHT/LEFT HEART CATH AND CORONARY ANGIOGRAPHY N/A 06/30/2018   Procedure: RIGHT/LEFT HEART CATH AND CORONARY ANGIOGRAPHY;  Surgeon: Lyn Records, MD;  Location[facility];  Service: Cardiovascular;  Laterality: N/A;    MEDICATIONS: No current facility-administered medications for this encounter.    Marland Kitchen albuterol (PROVENTIL HFA;VENTOLIN HFA) 108 (90 Base) MCG/ACT inhaler  . aspirin EC 81 MG tablet  . atorvastatin (LIPITOR) 40 MG tablet  . lisinopril-hydrochlorothiazide (PRINZIDE,ZESTORETIC) 20-12.5 MG tablet  . valACYclovir (VALTREX) 500 MG tablet    [facility] Short Stay Center/Anesthesiology Phone [phone] 07/29/2018 10:14 AM

## 2018-07-30 ENCOUNTER — Ambulatory Visit (HOSPITAL_COMMUNITY): Payer: Commercial Managed Care - PPO | Admitting: Vascular Surgery

## 2018-07-30 ENCOUNTER — Encounter (HOSPITAL_COMMUNITY)
Admission: RE | Disposition: A | Discharge status: Home / Self Care | Payer: Self-pay | Source: Ambulatory Visit | Attending: Dentistry

## 2018-07-30 ENCOUNTER — Ambulatory Visit (HOSPITAL_COMMUNITY)
Admission: RE | Admit: 2018-07-30 | Discharge: 2018-07-30 | Disposition: A | Discharge status: Home / Self Care | Payer: Commercial Managed Care - PPO | Source: Ambulatory Visit | Attending: Dentistry | Admitting: Dentistry

## 2018-07-30 ENCOUNTER — Encounter (HOSPITAL_COMMUNITY): Payer: Self-pay | Admitting: Urology

## 2018-07-30 DIAGNOSIS — Z79899 Other long term (current) drug therapy: Secondary | ICD-10-CM | POA: Diagnosis not present

## 2018-07-30 DIAGNOSIS — K036 Deposits [accretions] on teeth: Secondary | ICD-10-CM | POA: Diagnosis not present

## 2018-07-30 DIAGNOSIS — F17200 Nicotine dependence, unspecified, uncomplicated: Secondary | ICD-10-CM | POA: Insufficient documentation

## 2018-07-30 DIAGNOSIS — I2510 Atherosclerotic heart disease of native coronary artery without angina pectoris: Secondary | ICD-10-CM | POA: Diagnosis not present

## 2018-07-30 DIAGNOSIS — I350 Nonrheumatic aortic (valve) stenosis: Secondary | ICD-10-CM | POA: Insufficient documentation

## 2018-07-30 DIAGNOSIS — K045 Chronic apical periodontitis: Secondary | ICD-10-CM | POA: Diagnosis present

## 2018-07-30 DIAGNOSIS — K0530 Chronic periodontitis, unspecified: Secondary | ICD-10-CM | POA: Diagnosis not present

## 2018-07-30 DIAGNOSIS — K0889 Other specified disorders of teeth and supporting structures: Secondary | ICD-10-CM

## 2018-07-30 DIAGNOSIS — I10 Essential (primary) hypertension: Secondary | ICD-10-CM | POA: Diagnosis not present

## 2018-07-30 DIAGNOSIS — Z7982 Long term (current) use of aspirin: Secondary | ICD-10-CM | POA: Insufficient documentation

## 2018-07-30 DIAGNOSIS — E785 Hyperlipidemia, unspecified: Secondary | ICD-10-CM | POA: Insufficient documentation

## 2018-07-30 DIAGNOSIS — J449 Chronic obstructive pulmonary disease, unspecified: Secondary | ICD-10-CM | POA: Diagnosis not present

## 2018-07-30 DIAGNOSIS — K035 Ankylosis of teeth: Secondary | ICD-10-CM

## 2018-07-30 HISTORY — DX: Pneumonia, unspecified organism: J18.9

## 2018-07-30 HISTORY — PX: TOOTH EXTRACTION: SHX859

## 2018-07-30 LAB — CBC
HEMATOCRIT: 46.8 % (ref 39.0–52.0)
HEMOGLOBIN: 15 g/dL (ref 13.0–17.0)
MCH: 31 pg (ref 26.0–34.0)
MCHC: 32.1 g/dL (ref 30.0–36.0)
MCV: 96.7 fL (ref 80.0–100.0)
NRBC: 0 % (ref 0.0–0.2)
Platelets: 223 10*3/uL (ref 150–400)
RBC: 4.84 MIL/uL (ref 4.22–5.81)
RDW: 13 % (ref 11.5–15.5)
WBC: 11.2 10*3/uL — ABNORMAL HIGH (ref 4.0–10.5)

## 2018-07-30 LAB — BASIC METABOLIC PANEL
Anion gap: 8 (ref 5–15)
BUN: 14 mg/dL (ref 8–23)
CO2: 24 mmol/L (ref 22–32)
CREATININE: 0.82 mg/dL (ref 0.61–1.24)
Calcium: 8.9 mg/dL (ref 8.9–10.3)
Chloride: 106 mmol/L (ref 98–111)
GFR calc Af Amer: >60 mL/min (ref >60)
GFR calc non Af Amer: >60 mL/min (ref >60)
GLUCOSE: 114 mg/dL — AB (ref 70–99)
Potassium: 3.9 mmol/L (ref 3.5–5.1)
Sodium: 138 mmol/L (ref 135–145)

## 2018-07-30 SURGERY — DENTAL RESTORATION/EXTRACTIONS
Anesthesia: General | Site: Mouth

## 2018-07-30 MED ORDER — FENTANYL CITRATE (PF) 100 MCG/2ML IJ SOLN: 25.0000 ug | INTRAMUSCULAR | Status: DC | PRN

## 2018-07-30 MED ORDER — SODIUM CHLORIDE 0.9 % IV SOLN
INTRAVENOUS | Status: DC | PRN
  Administered 2018-07-30: 30 ug/min via INTRAVENOUS

## 2018-07-30 MED ORDER — LIDOCAINE HCL (PF) 2 % IJ SOLN
INTRAMUSCULAR | Status: DC | PRN
  Administered 2018-07-30: 3.4 mL via INTRADERMAL

## 2018-07-30 MED ORDER — ONDANSETRON HCL 4 MG/2ML IJ SOLN
INTRAMUSCULAR | Status: DC | PRN
  Administered 2018-07-30: 4 mg via INTRAVENOUS

## 2018-07-30 MED ORDER — CEFAZOLIN SODIUM-DEXTROSE 2-4 GM/100ML-% IV SOLN
INTRAVENOUS | Status: AC
  Filled 2018-07-30: qty 100

## 2018-07-30 MED ORDER — MIDAZOLAM HCL 2 MG/2ML IJ SOLN
INTRAMUSCULAR | Status: AC
  Filled 2018-07-30: qty 2

## 2018-07-30 MED ORDER — DEXAMETHASONE SODIUM PHOSPHATE 10 MG/ML IJ SOLN
INTRAMUSCULAR | Status: DC | PRN
  Administered 2018-07-30: 10 mg via INTRAVENOUS

## 2018-07-30 MED ORDER — PROPOFOL 10 MG/ML IV BOLUS
INTRAVENOUS | Status: AC
  Filled 2018-07-30: qty 20

## 2018-07-30 MED ORDER — HYDROCODONE-ACETAMINOPHEN 5-325 MG PO TABS: 1.0000 | ORAL_TABLET | ORAL | 0 refills | Status: DC | PRN

## 2018-07-30 MED ORDER — HEMOSTATIC AGENTS (NO CHARGE) OPTIME
TOPICAL | Status: DC | PRN
  Administered 2018-07-30: 1 via TOPICAL

## 2018-07-30 MED ORDER — ROCURONIUM BROMIDE 10 MG/ML (PF) SYRINGE
PREFILLED_SYRINGE | INTRAVENOUS | Status: DC | PRN
  Administered 2018-07-30: 20 mg via INTRAVENOUS
  Administered 2018-07-30: 30 mg via INTRAVENOUS

## 2018-07-30 MED ORDER — LIDOCAINE-EPINEPHRINE 2 %-1:100000 IJ SOLN
INTRAMUSCULAR | Status: AC
  Filled 2018-07-30: qty 5.1

## 2018-07-30 MED ORDER — LACTATED RINGERS IV SOLN
INTRAVENOUS | Status: DC | PRN
  Administered 2018-07-30 (×2): via INTRAVENOUS

## 2018-07-30 MED ORDER — BUPIVACAINE-EPINEPHRINE (PF) 0.5% -1:200000 IJ SOLN
INTRAMUSCULAR | Status: AC
  Filled 2018-07-30: qty 5.4

## 2018-07-30 MED ORDER — ALBUTEROL SULFATE HFA 108 (90 BASE) MCG/ACT IN AERS
INHALATION_SPRAY | RESPIRATORY_TRACT | Status: AC
  Filled 2018-07-30: qty 6.7

## 2018-07-30 MED ORDER — ONDANSETRON HCL 4 MG/2ML IJ SOLN: 4.0000 mg | Freq: Once | INTRAMUSCULAR | Status: DC | PRN

## 2018-07-30 MED ORDER — SUGAMMADEX SODIUM 500 MG/5ML IV SOLN
INTRAVENOUS | Status: AC
  Filled 2018-07-30: qty 5

## 2018-07-30 MED ORDER — OXYMETAZOLINE HCL 0.05 % NA SOLN
NASAL | Status: AC
  Filled 2018-07-30: qty 15

## 2018-07-30 MED ORDER — CEFAZOLIN SODIUM-DEXTROSE 2-4 GM/100ML-% IV SOLN
2.0000 g | Freq: Once | INTRAVENOUS | Status: AC
  Administered 2018-07-30: 2 g via INTRAVENOUS

## 2018-07-30 MED ORDER — FENTANYL CITRATE (PF) 250 MCG/5ML IJ SOLN
INTRAMUSCULAR | Status: AC
  Filled 2018-07-30: qty 5

## 2018-07-30 MED ORDER — 0.9 % SODIUM CHLORIDE (POUR BTL) OPTIME
TOPICAL | Status: DC | PRN
  Administered 2018-07-30: 1000 mL

## 2018-07-30 MED ORDER — FENTANYL CITRATE (PF) 250 MCG/5ML IJ SOLN
INTRAMUSCULAR | Status: DC | PRN
  Administered 2018-07-30 (×4): 50 ug via INTRAVENOUS
  Administered 2018-07-30 (×2): 25 ug via INTRAVENOUS

## 2018-07-30 MED ORDER — ALBUTEROL SULFATE HFA 108 (90 BASE) MCG/ACT IN AERS
INHALATION_SPRAY | RESPIRATORY_TRACT | Status: DC | PRN
  Administered 2018-07-30: 4 via RESPIRATORY_TRACT

## 2018-07-30 MED ORDER — ALBUMIN HUMAN 5 % IV SOLN
INTRAVENOUS | Status: DC | PRN
  Administered 2018-07-30: 09:00:00 via INTRAVENOUS

## 2018-07-30 MED ORDER — MIDAZOLAM HCL 5 MG/5ML IJ SOLN
INTRAMUSCULAR | Status: DC | PRN
  Administered 2018-07-30: 1 mg via INTRAVENOUS

## 2018-07-30 MED ORDER — PHENYLEPHRINE 40 MCG/ML (10ML) SYRINGE FOR IV PUSH (FOR BLOOD PRESSURE SUPPORT)
PREFILLED_SYRINGE | INTRAVENOUS | Status: DC | PRN
  Administered 2018-07-30 (×5): 80 ug via INTRAVENOUS

## 2018-07-30 MED ORDER — PROPOFOL 10 MG/ML IV BOLUS
INTRAVENOUS | Status: DC | PRN
  Administered 2018-07-30: 100 mg via INTRAVENOUS
  Administered 2018-07-30: 50 mg via INTRAVENOUS

## 2018-07-30 MED ORDER — LIDOCAINE 2% (20 MG/ML) 5 ML SYRINGE
INTRAMUSCULAR | Status: DC | PRN
  Administered 2018-07-30: 80 mg via INTRAVENOUS

## 2018-07-30 MED ORDER — SUGAMMADEX SODIUM 200 MG/2ML IV SOLN
INTRAVENOUS | Status: DC | PRN
  Administered 2018-07-30: 413.6 mg via INTRAVENOUS

## 2018-07-30 SURGICAL SUPPLY — 41 items
ALCOHOL 70% 16 OZ (MISCELLANEOUS) ×2 IMPLANT
ATTRACTOMAT 16X20 MAGNETIC DRP (DRAPES) ×2 IMPLANT
BLADE 10 SAFETY STRL DISP (BLADE) IMPLANT
BLADE SURG 15 STRL LF DISP TIS (BLADE) ×2 IMPLANT
BLADE SURG 15 STRL SS (BLADE) ×4
CANISTER SUCT 3000ML PPV (MISCELLANEOUS) ×2 IMPLANT
COVER SURGICAL LIGHT HANDLE (MISCELLANEOUS) ×2 IMPLANT
COVER WAND RF STERILE (DRAPES) ×2 IMPLANT
CRADLE DONUT ADULT HEAD (MISCELLANEOUS) ×2 IMPLANT
GAUZE 4X4 16PLY RFD (DISPOSABLE) ×2 IMPLANT
GAUZE PACKING FOLDED 2  STR (GAUZE/BANDAGES/DRESSINGS) ×1
GAUZE PACKING FOLDED 2 STR (GAUZE/BANDAGES/DRESSINGS) ×1 IMPLANT
GLOVE BIO SURGEON STRL SZ 6.5 (GLOVE) ×2 IMPLANT
GLOVE BIOGEL PI IND STRL 8 (GLOVE) IMPLANT
GLOVE BIOGEL PI INDICATOR 8 (GLOVE)
GLOVE SURG ORTHO 8.0 STRL STRW (GLOVE) ×2 IMPLANT
GOWN STRL REUS W/TWL 2XL LVL3 (GOWN DISPOSABLE) ×2 IMPLANT
HEMOSTAT SURGICEL .5X2 ABSORB (HEMOSTASIS) IMPLANT
HEMOSTAT SURGICEL 2X14 (HEMOSTASIS) IMPLANT
KIT BASIN OR (CUSTOM PROCEDURE TRAY) IMPLANT
KIT TURNOVER KIT B (KITS) ×2 IMPLANT
NDL BLUNT 16X1.5 OR ONLY (NEEDLE) ×1 IMPLANT
NDL DENTAL 27 LONG (NEEDLE) IMPLANT
NEEDLE BLUNT 16X1.5 OR ONLY (NEEDLE) ×2 IMPLANT
NEEDLE DENTAL 27 LONG (NEEDLE) IMPLANT
NS IRRIG 1000ML POUR BTL (IV SOLUTION) ×2 IMPLANT
PACK EENT II TURBAN DRAPE (CUSTOM PROCEDURE TRAY) ×2 IMPLANT
PAD ARMBOARD 7.5X6 YLW CONV (MISCELLANEOUS) ×4 IMPLANT
SPONGE SURGIFOAM ABS GEL 100 (HEMOSTASIS) IMPLANT
SPONGE SURGIFOAM ABS GEL 12-7 (HEMOSTASIS) ×1 IMPLANT
SPONGE SURGIFOAM ABS GEL SZ50 (HEMOSTASIS) IMPLANT
SUCTION FRAZIER HANDLE 10FR (MISCELLANEOUS) ×1
SUCTION TUBE FRAZIER 10FR DISP (MISCELLANEOUS) ×1 IMPLANT
SUT CHROMIC 3 0 PS 2 (SUTURE) ×3 IMPLANT
SUT CHROMIC 4 0 P 3 18 (SUTURE) IMPLANT
SYR 50ML SLIP (SYRINGE) ×2 IMPLANT
TOWEL NATURAL 10PK STERILE (DISPOSABLE) ×2 IMPLANT
TUBE CONNECTING 12X1/4 (SUCTIONS) ×2 IMPLANT
WATER STERILE IRR 1000ML POUR (IV SOLUTION) ×2 IMPLANT
WATER TABLETS ICX (MISCELLANEOUS) ×2 IMPLANT
YANKAUER SUCT BULB TIP NO VENT (SUCTIONS) ×2 IMPLANT

## 2018-07-30 NOTE — H&P (Signed)
07/30/2018  Patient:            Blake Griffin Date of Birth:  [birth year] MRN:                [id number]   BP 135/70   Pulse 73   Temp 98.6 F (37 C) (Oral)   Resp 20   Ht 5\' 11"  (1.803 m)   Wt 103.4 kg   SpO2 95%   BMI 31.80 kg/m    KENTRELL HALLAHAN 62 year old male recently diagnosed with severe aortic stenosis and coronary artery disease.  Patient was seen as part of a pre-heart valve surgery dental protocol examination.  Patient now presents for extraction of tooth #5 with alveoloplasty and gross debridement remaining dentition in the operating room.  The patient denies any acute medical or dental changes.  Please see note from Dr. Cornelius Moras dated 07/20/2018 to act as the H&P for the dental operating room procedure.   Charlynne Pander, DDS       [facility]     CARDIOTHORACIC SURGERY CONSULTATION REPORT     Referring Provider is Lyn Records, MD   Primary Cardiologist is Revankar, Aundra Dubin, MD  PCP is Wilmer Floor., MD          Chief Complaint    Patient presents with    .   Aortic Stenosis            new patient consultation, ECHO, Cath to be reviewed    .   New Patient (Initial Visit)          HPI:     Patient is a 62 year old male with history of aortic stenosis, hypertension, hyperlipidemia, and long-standing tobacco abuse who has been referred for surgical consultation to discuss treatment options for management of severe symptomatic aortic stenosis and single-vessel coronary artery disease.     Patient states that his primary care physician noted a heart murmur on physical exam several years ago.  Over the past several months the patient has developed symptoms of exertional shortness of breath and chest tightness.  The patient lives a relatively sedentary lifestyle and drives a truck for living.  Despite this he has developed increasing symptoms of exertional chest tightness associated with exertional  shortness of breath.  He was seen by his primary care physician and referred for cardiology consultation.  He was evaluated by Dr. Tomie China and promptly scheduled for transthoracic echocardiogram and diagnostic cardiac catheterization.  Echocardiogram performed June 29, 2018 revealed severe, essentially critical, aortic stenosis with preserved left ventricular systolic function.  Peak velocity across the aortic valve measured 4.7 m/s corresponding to mean transvalvular gradient estimated 54 mmHg.  The DVI was quite low at 0.16 with aortic valve area calculated 0.54 cm.  Left ventricular systolic function remains preserved with ejection fraction estimated 60 to 65%.  There was moderate left ventricular hypertrophy with significant diastolic dysfunction.  Left and right heart catheterization was performed the following day by Dr. Katrinka Blazing.  Catheterization confirmed the presence of critical aortic stenosis with peak to peak and mean transvalvular gradients measured 76 and 64 mmHg respectively, corresponding to aortic valve area calculated 0.91 cm.  The patient was also noted to have significant single-vessel coronary artery disease with 60 to 75% hazy stenosis of the mid right coronary artery.  Right heart pressures were normal.  Cardiothoracic surgical consultation was requested.     Patient is married and lives with his wife in [place].  Up until 2  weeks ago he worked full-time as a [employer] architect, but he has now taken early retirement.  He does not exercise on a regular basis.  He does state that over the past 2 weeks he has been working around the house and doing chores in the yard without significant limitations, although he does get exertional chest tightness and shortness of breath.  Symptoms are always relieved by rest.  He denies any nocturnal angina or resting shortness of breath.  He denies any history of PND, orthopnea, or lower extremity edema.  He has not had palpitations, dizzy spells, nor  syncope.          Past Medical History:    Diagnosis   Date    .   Anal fissure        .   Aortic stenosis        .   Bilateral carotid bruits        .   COPD (chronic obstructive pulmonary disease) (HCC)        .   Dyslipidemia        .   ED (erectile dysfunction)        .   Hemorrhoids        .   Hypertension        .   Hypogonadism in male        .   Leg cramps        .   Osteoarthrosis        .   Pain of left shoulder region        .   Petechiae        .   Smoking        .   Vitamin D deficiency                    Past Surgical History:    Procedure   Laterality   Date    .   APPENDECTOMY            .   RIGHT/LEFT HEART CATH AND CORONARY ANGIOGRAPHY   N/A   06/30/2018        Procedure: RIGHT/LEFT HEART CATH AND CORONARY ANGIOGRAPHY;  Surgeon: Lyn Records, MD;  Location: [facility];  Service: Cardiovascular;  Laterality: N/A;                Family History    Problem   Relation   Age of Onset    .   Hypertension   Mother        .   Hypertension   Father               Social History             Socioeconomic History    .   Marital status:   Married            Spouse name:   Not on file    .   Number of children:   Not on file    .   Years of education:   Not on file    .   Highest education level:   Not on file    Occupational History    .   Not on file    Social Needs    .   Financial resource strain:   Not on file    .   Food insecurity:  Worry:   Not on file            Inability:   Not on file    .   Transportation needs:            Medical:   Not on file            Non-medical:   Not on file    Tobacco Use    .   Smoking status:   Current Every Day Smoker    .   Smokeless  tobacco:   Never Used    Substance and Sexual Activity    .   Alcohol use:   Not on file    .   Drug use:   Not on file    .   Sexual activity:   Not on file    Lifestyle    .   Physical activity:            Days per week:   Not on file            Minutes per session:   Not on file    .   Stress:   Not on file    Relationships    .   Social connections:            Talks on phone:   Not on file            Gets together:   Not on file            Attends religious service:   Not on file            Active member of club or organization:   Not on file            Attends meetings of clubs or organizations:   Not on file            Relationship status:   Not on file    .   Intimate partner violence:            Fear of current or ex partner:   Not on file            Emotionally abused:   Not on file            Physically abused:   Not on file            Forced sexual activity:   Not on file    Other Topics   Concern    .   Not on file    Social History Narrative    .   Not on file                 Current Outpatient Medications    Medication   Sig   Dispense   Refill    .   albuterol (PROVENTIL HFA;VENTOLIN HFA) 108 (90 Base) MCG/ACT inhaler   Inhale 2 puffs into the lungs every 6 (six) hours as needed for wheezing or shortness of breath.             Marland Kitchen   aspirin EC 81 MG tablet   Take 1 tablet (81 mg total) by mouth daily.   90 tablet   3    .   atorvastatin (LIPITOR) 40 MG tablet   Take 1 tablet (40 mg total) by mouth daily.   30 tablet   11    .   lisinopril-hydrochlorothiazide (PRINZIDE,ZESTORETIC) 20-12.5 MG tablet  Take 2 tablets by mouth daily.             .   valACYclovir (VALTREX) 500 MG tablet   Take 500 mg by mouth daily as needed.                 No current  facility-administered medications for this visit.           No Known Allergies           Review of Systems:                 General:                      normal appetite, decreased energy, no weight gain, no weight loss, no fever              Cardiac:                       + chest pain with exertion, no chest pain at rest, + SOB with exertion, no resting SOB, no PND, no orthopnea, no palpitations, no arrhythmia, no atrial fibrillation, no LE edema, no dizzy spells, no syncope              Respiratory:                 no shortness of breath, no home oxygen, no productive cough, + dry cough, no bronchitis, + wheezing, no hemoptysis, no asthma, no pain with inspiration or cough, no sleep apnea, no CPAP at night              GI:                               no difficulty swallowing, no reflux, no frequent heartburn, no hiatal hernia, no abdominal pain, no constipation, no diarrhea, no hematochezia, no hematemesis, no melena              GU:                              no dysuria,  no frequency, no urinary tract infection, no hematuria, no enlarged prostate, no kidney stones, no kidney disease              Vascular:                     no pain suggestive of claudication, no pain in feet, no leg cramps, no varicose veins, no DVT, no non-healing foot ulcer              Neuro:                         no stroke, no TIA's, no seizures, no headaches, no temporary blindness one eye,  no slurred speech, no peripheral neuropathy, no chronic pain, no instability of gait, no memory/cognitive dysfunction              Musculoskeletal:         + arthritis, no joint swelling, no myalgias, no difficulty walking, normal mobility               Skin:  no rash, no itching, no skin infections, no pressure sores or ulcerations              Psych:                         no anxiety, no depression, no nervousness, no unusual recent stress              Eyes:                            no blurry vision, no floaters, no recent vision changes, + wears glasses for reading only              ENT:                            no hearing loss, no loose or painful teeth, no dentures, last saw dentist 3 years ago              Hematologic:               no easy bruising, no abnormal bleeding, no clotting disorder, no frequent epistaxis              Endocrine:                   no diabetes, does not check CBG's at home                                                                Physical Exam:                 BP (!) 142/78 (BP Location: Right Arm, Patient Position: Sitting, Cuff Size: Normal)   Pulse 62   Resp 18   Ht 5\' 11"  (1.803 m)   Wt 227 lb 3.2 oz (103.1 kg)   SpO2 97% Comment: RA  BMI 31.69 kg/m               General:                        well-appearing              HEENT:                       Unremarkable               Neck:                           no JVD, no bruits, no adenopathy               Chest:                          clear to auscultation, symmetrical breath sounds, no wheezes, no rhonchi               CV:                              RRR, grade  III/VI crescendo/decrescendo murmur heard best at RSB,  no diastolic murmur              Abdomen:                    soft, non-tender, no masses               Extremities:                 warm, well-perfused, pulses diminished but palpable, no LE edema              Rectal/GU                   Deferred              Neuro:                         Grossly non-focal and symmetrical throughout              Skin:                            Clean and dry, no rashes, no breakdown        Diagnostic Tests:     Transthoracic Echocardiography     Patient:    Kejon, Feild  MR #:       [id number]  Study Date: 06/29/2018  Gender:     M  Age:        17  Height:     180.3 cm  Weight:     103.6 kg  BSA:        2.31 m^2  Pt. Status:  Room:      Ethelle Lyon,  Mila Homer   ATTENDING    Belva Crome, MD   ORDERING     Belva Crome, MD   REFERRING    Belva Crome, MD   SONOGRAPHER  Lanae Crumbly, RDCS   PERFORMING   [facility], [place]     cc:     -------------------------------------------------------------------  LV EF: 60% -   65%     -------------------------------------------------------------------  Indications:      Chest pain 786.51.     -------------------------------------------------------------------  History:   PMH:   Aortic stenosis.  Risk factors:  Current tobacco  use. Hypertension. Dyslipidemia.     -------------------------------------------------------------------  Study Conclusions     - Left ventricle: The cavity size was normal. Wall thickness was    increased in a pattern of moderate LVH. Systolic function was    normal. The estimated ejection fraction was in the range of 60%    to 65%. Wall motion was normal; there were no regional wall    motion abnormalities.  - Aortic valve: There was critical stenosis. Valve area (VTI): 0.54    cm^2. Valve area (Vmax): 0.56 cm^2. Valve area (Vmean): 0.53    cm^2.     Impressions:     - 1. Critical aortic stenosis. Please see detailed measurements in    the body of the report.    2. Concentric left ventricular hypertrophy with preserved left    ventricular systolic function.     -------------------------------------------------------------------  Study data:  No prior study was available for comparison.  Study  status:  Routine.  Procedure:  The patient reported no pain pre or  post test. Transthoracic echocardiography. Image quality was  adequate.  Study completion:  There were no complications.  Transthoracic echocardiography.  M-mode, complete 2D, spectral  Doppler, and color Doppler.  Birthdate:  Patient birthdate:  08/01/1956.  Age:  Patient is 62 yr old.  Sex:  Gender: male.  BMI: 31.9 kg/m^2.  Blood pressure:      127/62  Patient status:  Outpatient.  Study date:  Study date: 06/29/2018. Study time: 10:23  AM.  Location:  Echo laboratory.     -------------------------------------------------------------------     -------------------------------------------------------------------  Left ventricle:  The cavity size was normal. Wall thickness was  increased in a pattern of moderate LVH. Systolic function was  normal. The estimated ejection fraction was in the range of 60% to  65%. Wall motion was normal; there were no regional wall motion  abnormalities.     -------------------------------------------------------------------  Aortic valve:   Doppler:   There was critical stenosis.   There was  no regurgitation.    VTI ratio of LVOT to aortic valve: 0.16. Valve  area (VTI): 0.54 cm^2. Indexed valve area (VTI): 0.23 cm^2/m^2.  Peak velocity ratio of LVOT to aortic valve: 0.16. Valve area  (Vmax): 0.56 cm^2. Indexed valve area (Vmax): 0.24 cm^2/m^2. Mean  velocity ratio of LVOT to aortic valve: 0.15. Valve area (Vmean):  0.53 cm^2. Indexed valve area (Vmean): 0.23 cm^2/m^2.    Mean  gradient (S): 54 mm Hg. Peak gradient (S): 88 mm Hg.     -------------------------------------------------------------------  Aorta:  Aortic root: The aortic root was normal in size.     -------------------------------------------------------------------  Mitral valve:   Structurally normal valve.   Mobility was not  restricted.  Doppler:  Transvalvular velocity was within the normal  range. There was no evidence for stenosis. There was no  regurgitation.    Valve area by pressure half-time: 3.38 cm^2.  Indexed valve area by pressure half-time: 1.46 cm^2/m^2.    Peak  gradient (D): 5 mm Hg.     -------------------------------------------------------------------  Left atrium:  The atrium was normal in size.      -------------------------------------------------------------------  Right ventricle:  The cavity size was normal. Wall thickness was  normal. Systolic function was normal.     -------------------------------------------------------------------  Pulmonic valve:    Doppler:  Transvalvular velocity was within the  normal range. There was no evidence for stenosis.     -------------------------------------------------------------------  Tricuspid valve:   Structurally normal valve.    Doppler:  Transvalvular velocity was within the normal range. There was no  regurgitation.     -------------------------------------------------------------------  Pulmonary artery:   The main pulmonary artery was normal-sized.  Systolic pressure was within the normal range.     -------------------------------------------------------------------  Right atrium:  The atrium was normal in size.     -------------------------------------------------------------------  Pericardium:  There was no pericardial effusion.     -------------------------------------------------------------------  Systemic veins:  Inferior vena cava: The vessel was normal in size.     -------------------------------------------------------------------  Measurements      Left ventricle                           Value          Reference   LV ID, ED, PLAX chordal                  51    mm       43 - 52   LV ID, ES, PLAX chordal  32    mm       23 - 38   LV fx shortening, PLAX chordal           37    %        >=29   LV PW thickness, ED                      14    mm       ----------   IVS/LV PW ratio, ED                      1              <=1.3   Stroke volume, 2D                        69    ml       ----------   Stroke volume/bsa, 2D                    30    ml/m^2   ----------   LV e&', lateral                           6.96  cm/s     ----------   LV E/e&', lateral                          16.52          ----------   LV e&', medial                            5.22  cm/s     ----------   LV E/e&', medial                          22.03          ----------   LV e&', average                           6.09  cm/s     ----------   LV E/e&', average                         18.88          ----------      Ventricular septum                       Value          Reference   IVS thickness, ED                        14    mm       ----------      LVOT                                     Value          Reference   LVOT ID, S  21    mm       ----------   LVOT area                                3.46  cm^2     ----------   LVOT peak velocity, S                    75.4  cm/s     ----------   LVOT mean velocity, S                    54    cm/s     ----------   LVOT VTI, S                              20    cm       ----------   LVOT peak gradient, S                    2     mm Hg    ----------      Aortic valve                             Value          Reference   Aortic valve peak velocity, S            469   cm/s     ----------   Aortic valve mean velocity, S            355   cm/s     ----------   Aortic valve VTI, S                      127   cm       ----------   Aortic mean gradient, S                  54    mm Hg    ----------   Aortic peak gradient, S                  88    mm Hg    ----------   VTI ratio, LVOT/AV                       0.16           ----------   Aortic valve area, VTI                   0.54  cm^2     ----------   Aortic valve area/bsa, VTI               0.23  cm^2/m^2 ----------   Velocity ratio, peak, LVOT/AV            0.16           ----------   Aortic valve area, peak velocity         0.56  cm^2     ----------   Aortic valve area/bsa, peak              0.24  cm^2/m^2 ----------   velocity   Velocity ratio, mean, LVOT/AV            0.15           ----------  Aortic valve area, mean  velocity         0.53  cm^2     ----------   Aortic valve area/bsa, mean              0.23  cm^2/m^2 ----------   velocity      Aorta                                    Value          Reference   Aortic root ID, ED                       34    mm       ----------   Ascending aorta ID, A-P, S               38    mm       ----------   Descending aorta ID, proximal            29    mm       ----------      Left atrium                              Value          Reference   LA ID, A-P, ES                           42    mm       ----------   LA ID/bsa, A-P                           1.82  cm/m^2   <=2.2   LA volume, S                             74.8  ml       ----------   LA volume/bsa, S                         32.4  ml/m^2   ----------   LA volume, ES, 1-p A4C                   61.2  ml       ----------   LA volume/bsa, ES, 1-p A4C               26.5  ml/m^2   ----------   LA volume, ES, 1-p A2C                   81.6  ml       ----------   LA volume/bsa, ES, 1-p A2C               35.4  ml/m^2   ----------      Mitral valve                             Value          Reference   Mitral E-wave peak velocity              115  cm/s     ----------   Mitral A-wave peak velocity              75.7  cm/s     ----------   Mitral deceleration time                 222   ms       150 - 230   Mitral pressure half-time                65    ms       ----------   Mitral peak gradient, D                  5     mm Hg    ----------   Mitral E/A ratio, peak                   1.5            ----------   Mitral valve area, PHT, DP               3.38  cm^2     ----------   Mitral valve area/bsa, PHT, DP           1.46  cm^2/m^2 ----------      Pulmonary arteries                       Value          Reference   PA pressure, S, DP                       21    mm Hg    <=30      Tricuspid valve                          Value          Reference   Tricuspid regurg peak velocity            179   cm/s     ----------   Tricuspid peak RV-RA gradient            13    mm Hg    ----------      Right atrium                             Value          Reference   RA ID, S-I, ES, A4C               (H)    50.7  mm       34 - 49   RA area, ES, A4C                         15.5  cm^2     8.3 - 19.5   RA volume, ES, A/L                       38.1  ml       ----------   RA volume/bsa, ES, A/L                   16.5  ml/m^2   ----------      Systemic veins  Value          Reference   Estimated CVP                            8     mm Hg    ----------      Right ventricle                          Value          Reference   TAPSE                                    21.9  mm       ----------   RV pressure, S, DP                       21    mm Hg    <=30   RV s&', lateral, S                        13.8  cm/s     ----------     Legend:  (L)  and  (H)  mark values outside specified reference range.     -------------------------------------------------------------------  Prepared and Electronically Authenticated by     Belva Crome, MD  2019-09-16T12:39:55          RIGHT/LEFT HEART CATH AND CORONARY ANGIOGRAPHY    Conclusion        .Critical aortic stenosis, calculated valve area 0.91 cm.  Peak to peak gradient 76 mmHg, mean gradient 64 mmHg.   .Intermediate stenosis mid RCA in the 60 to 75% range.   .Widely patent left coronary system.      RECOMMENDATIONS:     .Severe aortic stenosis documented by echo and invasive evaluation.  Referred to the aortic valve clinic for consideration of low risk TAVR with hybrid RCA stent (question timing) versus SAVR and RCA bypass.   .Start high intensity statin therapy.  Avoid nonsteroidal therapy.  Continue aspirin.  Avoids activities that cause chest discomfort.      Recommend Aspirin 81mg  daily for moderate CAD.    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 Intravenous sedation with Versed and fentanyl was administered. 1% Xylocaine was infiltrated to achieve local analgesia. Using real-time vascular ultrasound, a double wall stick with an angiocath was utilized to obtain intra-arterial access. A VUS image was saved for the permanent record.The modified Seldinger technique was used to place a 67F " Slender" sheath in the right radial artery. Weight based heparin was administered. Coronary angiography was done using 5 F catheters. Right coronary angiography was performed with a JR4. Left ventricular hemodymic recordings and angiography was done using the JR 4 catheter and hand injection. Left coronary angiography was performed with a JL 3.5 cm.  Right heart catheterization was performed by exchanging a previously placed antecubital IV angio-cath for a 5 French Slender sheath. 1% Xylocaine was used to locally nesthetize the area around the IV site. The IV catheter  was wired using an .018 guidewire. The modified Seldinger technique was used to place the 5 [place] sheath. Double glove technique was used to enhance sterility. After sheath insertion, right heart cath was performed using a 5 French balloon tipped catheter and fluoroscopic guidance. Pressures were recorded in each chamber and in the pulmonary capillary wedge position.. The main pulmonary artery O2 saturation was sampled.   Hemostasis was achieved using a pneumatic band.  During this procedure the patient is administered a total of Versed 1.5 mg and Fentanyl 25 mg to achieve and maintain moderate conscious sedation. The patient's heart rate, blood pressure, and oxygen saturation are monitored continuously during the procedure. The period of  conscious sedation is 37 minutes, of which I was present face-to-face 100% of this time.   Estimated blood loss <50 mL.  During this procedure the patient was administered the following to achieve and maintain moderate conscious sedation: Versed 1.5 mg, Fentanyl 25 mcg, while the patient's heart rate, blood pressure, and oxygen saturation were continuously monitored. The period of conscious sedation was 37 minutes, of which I was present face-to-face 100% of this time.    Coronary Findings      Diagnostic   Dominance: Right     Left Anterior Descending    Ost LAD to Prox LAD lesion 25% stenosed    Ost LAD to Prox LAD lesion is 25% stenosed.    Right Coronary Artery    Mid RCA lesion 65% stenosed    Mid RCA lesion is 65% stenosed.    Intervention      No interventions have been documented.   Right Heart        Right Heart Pressures   Hemodynamic findings consistent with mild pulmonary hypertension. Elevated LV EDP consistent with volume overload.    Left Heart        Left Ventricle   LV end diastolic pressure is moderately elevated.    Aortic Valve   There is severe aortic valve stenosis. The aortic valve is calcified. There is restricted aortic valve motion.    Coronary Diagrams        Diagnostic Diagram       untitled image       Implants           No implant documentation for this case.    MERGE Images        Show images for CARDIAC CATHETERIZATION      Link to Procedure Log       Procedure Log       Hemo Data              Most Recent Value     Fick Cardiac Output   6.16 L/min    Fick Cardiac Output Index   2.75 (L/min)/BSA    Aortic Mean Gradient   64.38 mmHg    Aortic Peak Gradient   76 mmHg    Aortic Valve Area   0.91    Aortic Value Area Index   0.41 cm2/BSA    RA A Wave   13 mmHg    RA V Wave   10 mmHg    RA Mean   9 mmHg    RV Systolic  Pressure   35 mmHg    RV Diastolic Pressure   5 mmHg    RV EDP   12 mmHg    PA Systolic Pressure   37 mmHg    PA Diastolic Pressure   14 mmHg  PA Mean   23 mmHg    PW A Wave   18 mmHg    PW V Wave   16 mmHg    PW Mean   13 mmHg    AO Systolic Pressure   108 mmHg    AO Diastolic Pressure   55 mmHg    AO Mean   73 mmHg    LV Systolic Pressure   181 mmHg    LV Diastolic Pressure   10 mmHg    LV EDP   21 mmHg    AOp Systolic Pressure   102 mmHg    AOp Diastolic Pressure   55 mmHg    AOp Mean Pressure   72 mmHg    LVp Systolic Pressure   178 mmHg    LVp Diastolic Pressure   11 mmHg    LVp EDP Pressure   19 mmHg    QP/QS   1    TPVR Index   8.35 HRUI    TSVR Index   26.85 HRUI    PVR SVR Ratio   0.15    TPVR/TSVR Ratio   0.31          STS Risk Calculator     Procedure: AVR + CAB  CALCULATE        Risk of Mortality:   0.661%  Renal Failure:   0.745%  Permanent Stroke:   0.747%  Prolonged Ventilation:   4.140%  DSW Infection:   0.239%  Reoperation:   2.486%  Morbidity or Mortality:   6.765%  Short Length of Stay:   59.103%  Long Length of Stay:   2.876%           Impression:     Patient has stage D severe symptomatic aortic stenosis with single-vessel coronary artery disease.  He presents with a several month history of worsening symptoms of exertional chest tightness and shortness of breath consistent with stable angina pectoris and chronic diastolic congestive heart failure, New York Heart Association functional class II.  I have personally reviewed the patient's recent transthoracic echocardiogram and diagnostic cardiac catheterization.  Echocardiogram confirmed the presence of severe aortic stenosis.  The aortic valve appears trileaflet although a functionally bicuspid valve is possible.  There is severe thickening,  calcification, and restricted leaflet mobility involving all 3 leaflets.  Peak velocity across the aortic valve measured 4.7 m/s corresponding to mean transvalvular gradient greater than 50 mmHg.  Left ventricular systolic function remains normal.  There is left ventricular hypertrophy with significant diastolic dysfunction.  Catheterization confirmed the presence of severe aortic stenosis and revealed significant single-vessel coronary artery disease with stenosis of the mid right coronary artery.  Risks associated with conventional surgical aortic valve replacement and coronary artery bypass grafting should be reasonably low.  Given the patient's relatively young age, conventional surgery would probably be associated with best long-term outcome.          Plan:     The patient and his wife were counseled at length regarding treatment alternatives for management of severe symptomatic aortic stenosis and coronary artery disease. Alternative approaches such as conventional aortic valve replacement with coronary artery bypass grafting, transcatheter aortic valve replacement with or without PCI and stenting of the right coronary artery, and continued medical therapy without intervention were compared and contrasted at length.  The risks associated with conventional surgical aortic valve replacement were discussed in detail, as were expectations for post-operative convalescence.  Issues specific to transcatheter aortic valve replacement were discussed  including questions about long term valve durability, the potential for paravalvular leak, possible increased risk of need for permanent pacemaker placement, and other technical complications related to the procedure itself.  Long-term prognosis with medical therapy was discussed. This discussion was placed in the context of the patient's own specific clinical presentation and past medical history.  All of their questions have been addressed.       As a next  step the patient will undergo cardiac gated CT angiogram of the heart and CT angiogram of the aorta and iliac vessels to evaluate the anatomy of the aortic valve and aortic root as well as the feasibility of transcatheter aortic valve replacement.  Assuming that CT angiogram does not reveal any complicating features, the patient desires to proceed with conventional surgical aortic valve replacement and coronary artery bypass grafting in the near future.  We tentatively plan for surgery on July 17, 2018.  Prior to surgery the patient will be referred for dental service consultation for routine examination and cleaning.  The patient will return to our office prior to surgery on August 03, 2018.           I spent in excess of 90 minutes during the conduct of this office consultation and >50% of this time involved direct face-to-face encounter with the patient for counseling and/or coordination of their care.              Salvatore Decent. Cornelius Moras, MD  07/20/2018  11:01 AM                 Electronically signed by Purcell Nails, MD at 07/20/2018  6:05 PM Electronically signed by Purcell Nails, MD at 07/20/2018  6:09 PM

## 2018-07-30 NOTE — Discharge Instructions (Signed)

## 2018-07-30 NOTE — Progress Notes (Signed)
PRE-OPERATIVE NOTE:  07/30/2018 Blake Griffin [id number]  VITALS: BP 135/70   Pulse 73   Temp 98.6 F (37 C) (Oral)   Resp 20   Ht 5\' 11"  (1.803 m)   Wt 103.4 kg   SpO2 95%   BMI 31.80 kg/m    CBC    Component Value Date/Time   WBC 11.2 (H) 07/30/2018 0629   RBC 4.84 07/30/2018 0629   HGB 15.0 07/30/2018 0629   HGB 14.2 06/29/2018 0938   HCT 46.8 07/30/2018 0629   HCT 41.8 06/29/2018 0938   PLT 223 07/30/2018 0629   PLT 226 06/29/2018 0938   MCV 96.7 07/30/2018 0629   MCV 94 06/29/2018 0938   MCH 31.0 07/30/2018 0629   MCHC 32.1 07/30/2018 0629   RDW 13.0 07/30/2018 0629   RDW 13.5 06/29/2018 0938   LYMPHSABS 2.1 06/29/2018 0938   EOSABS 0.1 06/29/2018 0938   BASOSABS 0.0 06/29/2018 0938    BMET    Component Value Date/Time   NA 138 07/30/2018 0629   NA 140 06/29/2018 0938   K 3.9 07/30/2018 0629   CL 106 07/30/2018 0629   CO2 24 07/30/2018 0629   GLUCOSE 114 (H) 07/30/2018 0629   BUN 14 07/30/2018 0629   BUN 18 06/29/2018 0938   CREATININE 0.82 07/30/2018 0629   CALCIUM 8.9 07/30/2018 0629   GFRNONAA >60 07/30/2018 0629   GFRAA >60 07/30/2018 0629     No results found for: INR, PROTIME No results found for: PTT   Blake Griffin presents for extraction of tooth #5 with alveoloplasty and full mouth debridement of remaining dentition in the operating room with general anesthesia.     SUBJECTIVE: The patient denies any acute medical or dental changes and agrees to proceed with treatment as planned.  EXAM: No sign of acute dental changes.  ASSESSMENT: Patient is affected by chronic periodontitis, chronic apical periodontitis, accretions, and loose teeth.  PLAN: Patient agrees to proceed with treatment as planned in the operating room as previously discussed and accepts the risks, benefits, and complications of the proposed treatment. Patient is aware of the risk for bleeding, bruising, swelling, infection, pain, nerve damage, soft tissue damage,  damage to adjacent teeth, sinus involvement, root tip fracture, mandible fracture, and the risks of complications associated with the anesthesia. Patient also is aware of the potential for other complications not mentioned above.   Charlynne Pander, DDS

## 2018-07-30 NOTE — Anesthesia Preprocedure Evaluation (Addendum)
Anesthesia Evaluation  Patient identified by MRN, date of birth, ID band Patient awake    Reviewed: Allergy & Precautions, NPO status , Patient's Chart, lab work & pertinent test results  Airway Mallampati: III  TM Distance: >3 FB Neck ROM: Full    Dental no notable dental hx.    Pulmonary COPD,  COPD inhaler, Current Smoker,    Pulmonary exam normal breath sounds clear to auscultation       Cardiovascular hypertension, Pt. on medications + CAD  + Valvular Problems/Murmurs AS  Rhythm:Regular Rate:Normal + Systolic murmurs ECG: NSR, rate 63  CATH: Critical aortic stenosis, calculated valve area 0.91 cm.  Peak to peak gradient 76 mmHg, mean gradient 64 mmHg. Intermediate stenosis mid RCA in the 60 to 75% range. Widely patent left coronary system. Severe aortic stenosis documented by echo and invasive evaluation.  Referred to the aortic valve clinic for consideration of low risk TAVR with hybrid RCA stent (question timing) versus SAVR and RCA bypass.  ECHO: Left ventricle: The cavity size was normal. Wall thickness was increased in a pattern of moderate LVH. Systolic function was normal. The estimated ejection fraction was in the range of 60% to 65%. Wall motion was normal; there were no regional wall motion abnormalities. Aortic valve: There was critical stenosis. Valve area (VTI): 0.54 cm^2. Valve area (Vmax): 0.56 cm^2. Valve area (Vmean): 0.53 cm^2. 1. Critical aortic stenosis. Please see detailed measurements in the body of the report. 2. Concentric left ventricular hypertrophy with preserved left ventricular systolic function.  Sees cardiologist Katrinka Blazing)   Neuro/Psych negative neurological ROS  negative psych ROS   GI/Hepatic negative GI ROS, Neg liver ROS,   Endo/Other  negative endocrine ROS  Renal/GU negative Renal ROS     Musculoskeletal negative musculoskeletal ROS (+)   Abdominal (+) + obese,   Peds  Hematology HLD   Anesthesia Other Findings severe aortic stenosis  periodontitis  Reproductive/Obstetrics                            Anesthesia Physical Anesthesia Plan  ASA: IV  Anesthesia Plan: General   Post-op Pain Management:    Induction: Intravenous  PONV Risk Score and Plan: 1 and Ondansetron, Dexamethasone, Midazolam and Treatment may vary due to age or medical condition  Airway Management Planned: Nasal ETT  Additional Equipment: Arterial line  Intra-op Plan:   Post-operative Plan: Extubation in OR  Informed Consent: I have reviewed the patients History and Physical, chart, labs and discussed the procedure including the risks, benefits and alternatives for the proposed anesthesia with the patient or authorized representative who has indicated his/her understanding and acceptance.   Dental advisory given  Plan Discussed with: CRNA  Anesthesia Plan Comments:        Anesthesia Quick Evaluation

## 2018-07-30 NOTE — Transfer of Care (Signed)
Immediate Anesthesia Transfer of Care Note  Patient: Blake Griffin  Procedure(s) Performed: Extraction of tooth #5 w/ Alveoloplasty and Gross debridement of remaining dentition. (N/A Mouth)  Patient Location: PACU  Anesthesia Type:General  Level of Consciousness: drowsy and patient cooperative  Airway & Oxygen Therapy: Patient Spontanous Breathing and Patient connected to face mask oxygen  Post-op Assessment: Report given to RN, Post -op Vital signs reviewed and stable and Patient moving all extremities  Post vital signs: Reviewed and stable  Last Vitals:  Vitals Value Taken Time  BP 140/59 07/30/2018  9:23 AM  Temp 97.60F   Pulse 70 07/30/2018  9:24 AM  Resp 23 07/30/2018  9:24 AM  SpO2 99 % 07/30/2018  9:24 AM  Vitals shown include unvalidated device data.  Last Pain:  Vitals:   07/30/18 0923  TempSrc:   PainSc: (P) 0-No pain         Complications: No apparent anesthesia complications

## 2018-07-30 NOTE — Anesthesia Postprocedure Evaluation (Signed)
Anesthesia Post Note  Patient: Blake Griffin  Procedure(s) Performed: Extraction of tooth #5 w/ Alveoloplasty and Gross debridement of remaining dentition. (N/A Mouth)     Patient location during evaluation: PACU Anesthesia Type: General Level of consciousness: awake and alert Pain management: pain level controlled Vital Signs Assessment: post-procedure vital signs reviewed and stable Respiratory status: spontaneous breathing, nonlabored ventilation, respiratory function stable and patient connected to nasal cannula oxygen Cardiovascular status: blood pressure returned to baseline and stable Postop Assessment: no apparent nausea or vomiting Anesthetic complications: no    Last Vitals:  Vitals:   07/30/18 1006 07/30/18 1013  BP: (!) 124/54 126/85  Pulse:    Resp: 18   Temp: 36.5 C   SpO2: 97% 99%    Last Pain:  Vitals:   07/30/18 1013  TempSrc:   PainSc: 0-No pain                 Sanyah Molnar P Krystianna Soth

## 2018-07-30 NOTE — Anesthesia Procedure Notes (Signed)
Procedure Name: Intubation Date/Time: 07/30/2018 8:12 AM Performed by: Jearld Pies, CRNA Pre-anesthesia Checklist: Patient identified, Emergency Drugs available, Suction available and Patient being monitored Patient Re-evaluated:Patient Re-evaluated prior to induction Oxygen Delivery Method: Circle System Utilized Preoxygenation: Pre-oxygenation with 100% oxygen Induction Type: IV induction Ventilation: Oral airway inserted - appropriate to patient size and Mask ventilation without difficulty Laryngoscope Size: Mac and 4 Grade View: Grade I Tube type: Reinforced Tube size: 7.5 mm Number of attempts: 1 Airway Equipment and Method: Stylet and Oral airway Placement Confirmation: ETT inserted through vocal cords under direct vision,  positive ETCO2 and breath sounds checked- equal and bilateral Secured at: 23 cm Tube secured with: Tape Dental Injury: Teeth and Oropharynx as per pre-operative assessment

## 2018-07-30 NOTE — Anesthesia Procedure Notes (Signed)
Arterial Line Insertion Start/End10/17/2019 7:05 AM, 07/30/2018 7:14 AM Performed by: Zollie Scale, CRNA, CRNA  Patient location: Pre-op. Preanesthetic checklist: patient identified, IV checked, site marked, risks and benefits discussed, surgical consent, monitors and equipment checked, pre-op evaluation, timeout performed and anesthesia consent Lidocaine 1% used for infiltration Left, radial was placed Catheter size: 20 G Hand hygiene performed , maximum sterile barriers used  and Seldinger technique used Allen's test indicative of satisfactory collateral circulation Attempts: 1 Procedure performed without using ultrasound guided technique. Following insertion, dressing applied and Biopatch. Post procedure assessment: normal  Patient tolerated the procedure well with no immediate complications.

## 2018-07-30 NOTE — Op Note (Signed)
OPERATIVE REPORT  Patient:            Blake Griffin Date of Birth:  05/19/1956 MRN:                [id number]   DATE OF PROCEDURE:  07/30/2018  PREOPERATIVE DIAGNOSES: 1.  Severe aortic stenosis 2.  Coronary artery disease 3.  Pre-heart valve surgery dental protocol 4.  Chronic apical periodontitis 5.  Chronic periodontitis 6.  Accretions  POSTOPERATIVE DIAGNOSES: 1.  Severe aortic stenosis 2.  Coronary artery disease 3.  Pre-heart valve surgery dental protocol 4.  Chronic apical periodontitis 5.  Chronic periodontitis 6.  Accretions  OPERATIONS: 1. Extraction of tooth #5 with alveoloplasty 2. Gross debridement of remaining dentition   SURGEON: Charlynne Pander, DDS  ASSISTANT: Pearletha Alfred (dental assistant)  ANESTHESIA: General anesthesia via oral endotracheal tube.  MEDICATIONS: 1. Ancef 2 g IV prior to invasive dental procedures. 2. Local anesthesia with a total utilization of 2 carpules each containing 34 mg of lidocaine with 0.017 mg of epinephrine  SPECIMENS: There was 1 tooth that was discarded.  DRAINS: None  CULTURES: None  COMPLICATIONS: None  ESTIMATED BLOOD LOSS: Less than 25 mLs.  INTRAVENOUS FLUIDS: Lactated ringers solution per anesthesia team record  INDICATIONS: The patient was recently diagnosed with severe aortic stenosis and coronary artery disease.  A medically necessary dental consultation was then requested to evaluate poor dentition.  The patient was examined and treatment planned for extraction of tooth #5 with alveoloplasty and gross debridement of remaining dentition in the operating with general anesthesia.  This treatment plan was formulated to decrease the risks and complications associated with dental infection from affecting the patient's systemic health and the anticipated heart valve surgery.  OPERATIVE FINDINGS: Patient was examined operating room number 8.  Tooth  #5 was identified for extraction.  The patient was  noted be affected by chronic apical periodontitis, chronic periodontitis, accretions, and loose teeth.   DESCRIPTION OF PROCEDURE: Patient was brought to the main operating room number 8. Patient was then placed in the supine position on the operating table. General anesthesia was then induced per the anesthesia team. The patient was then prepped and draped in the usual manner for dental medicine procedure. A timeout was performed. The patient was identified and procedures were verified. A throat pack was placed at this time. The oral cavity was then thoroughly examined with the findings noted above. The patient was then ready for dental medicine procedure as follows:  Local anesthesia was then administered sequentially with a total utilization of 2 carpules each containing 34 mg of lidocaine with 0.017 mg of epinephrine.  The Maxillary right quadrant was first approached. Anesthesia was then delivered utilizing infiltration with lidocaine with epinephrine. A #15 blade incision sulcular incision was made around tooth #5.  The soft tissue was removed from the hard tissue around tooth #5 with a Public house manager. Tooth number was then removed with a 150 forceps without complications.  The socket was curetted and compressed appropriately.  The socket was then irrigated with copious amounts sterile saline.  A piece of Surgifoam was placed in the extraction socket.  The surgical site was then closed from the mesial of #4 and extended to the distal #6 utilizing 3-0 chromic at suture in a continuous interrupted suture technique x1.    At this point time the remaining dentition was approached.  A Cavitron was used to remove significant accretions.  A series of hand curettes were then  used to further remove accretions.  The Cavitron was then again used to further refine the removal of accretions as needed.  This completed the gross debridement procedure.    At this point time, the entire mouth was irrigated with  copious amounts of sterile saline. The patient was examined for complications, seeing none, the dental medicine procedure was deemed to be complete. The throat pack was removed at this time. An oral airway was then placed at the request of the anesthesia team. A series of 4 x 4 gauze were placed in the mouth to aid hemostasis. The patient was then handed over to the anesthesia team for final disposition. After an appropriate amount of time, the patient was extubated and taken to the postanesthsia care unit in good condition. All counts were correct for the dental medicine procedure.   Charlynne Pander, DDS.

## 2018-07-31 ENCOUNTER — Ambulatory Visit (HOSPITAL_COMMUNITY)
Admission: RE | Admit: 2018-07-31 | Discharge: 2018-07-31 | Disposition: A | Discharge status: Home / Self Care | Payer: Commercial Managed Care - PPO | Source: Ambulatory Visit | Attending: Thoracic Surgery (Cardiothoracic Vascular Surgery) | Admitting: Thoracic Surgery (Cardiothoracic Vascular Surgery)

## 2018-07-31 ENCOUNTER — Encounter (HOSPITAL_COMMUNITY): Payer: Self-pay | Admitting: Dentistry

## 2018-07-31 DIAGNOSIS — Z01818 Encounter for other preprocedural examination: Secondary | ICD-10-CM

## 2018-07-31 DIAGNOSIS — I7409 Other arterial embolism and thrombosis of abdominal aorta: Secondary | ICD-10-CM

## 2018-07-31 DIAGNOSIS — J439 Emphysema, unspecified: Secondary | ICD-10-CM | POA: Diagnosis not present

## 2018-07-31 DIAGNOSIS — I714 Abdominal aortic aneurysm, without rupture: Secondary | ICD-10-CM | POA: Insufficient documentation

## 2018-07-31 DIAGNOSIS — I350 Nonrheumatic aortic (valve) stenosis: Secondary | ICD-10-CM

## 2018-07-31 DIAGNOSIS — I700 Atherosclerosis of aorta: Secondary | ICD-10-CM | POA: Diagnosis not present

## 2018-07-31 MED ORDER — IOPAMIDOL (ISOVUE-370) INJECTION 76%
100.0000 mL | Freq: Once | INTRAVENOUS | Status: AC | PRN
  Administered 2018-07-31: 100 mL via INTRAVENOUS

## 2018-07-31 MED ORDER — IOPAMIDOL (ISOVUE-370) INJECTION 76%: 100.0000 mL | Freq: Once | INTRAVENOUS | Status: DC | PRN

## 2018-08-03 NOTE — Pre-Procedure Instructions (Signed)
Blake Griffin  08/03/2018      [id number] Rosalita Levan, [place] - [address] AT [facility] [address] [place] [address] Phone: [phone] Fax: [phone]    Your procedure is scheduled on Oct. 24  Report to [facility] Admitting at 5:30  A.M.  Call this number if you have problems the morning of surgery:  [phone]   Remember:  Do not eat or drink after midnight.      Take these medicines the morning of surgery with A SIP OF WATER :               Albuterol inhaler if needed--bring to hospital              Hydrocodone if needed                 7 days prior to surgery STOP taking  Aleve, Naproxen, Ibuprofen, Motrin, Advil, Goody's, BC's, all herbal medications, fish oil, and all vitamins      Do not wear jewelry.  Do not wear lotions, powders, or perfumes, or deodorant.  Do not shave 48 hours prior to surgery.  Men may shave face and neck.  Do not bring valuables to the hospital.  [facility] is not responsible for any belongings or valuables.  Contacts, dentures or bridgework may not be worn into surgery.  Leave your suitcase in the car.  After surgery it may be brought to your room.  For patients admitted to the hospital, discharge time will be determined by your treatment team.  Patients discharged the day of surgery will not be allowed to drive home.    Special instructions:   [place]- Preparing For Surgery  Before surgery, you can play an important role. Because skin is not sterile, your skin needs to be as free of germs as possible. You can reduce the number of germs on your skin by washing with CHG (chlorahexidine gluconate) Soap before surgery.  CHG is an antiseptic cleaner which kills germs and bonds with the skin to continue killing germs even after washing.    Oral Hygiene is also important to reduce your risk of infection.  Remember - BRUSH YOUR TEETH THE MORNING OF  SURGERY WITH YOUR REGULAR TOOTHPASTE  Please do not use if you have an allergy to CHG or antibacterial soaps. If your skin becomes reddened/irritated stop using the CHG.  Do not shave (including legs and underarms) for at least 48 hours prior to first CHG shower. It is OK to shave your face.  Please follow these instructions carefully.   1. Shower the NIGHT BEFORE SURGERY and the MORNING OF SURGERY with CHG.   2. If you chose to wash your hair, wash your hair first as usual with your normal shampoo.  3. After you shampoo, rinse your hair and body thoroughly to remove the shampoo.  4. Use CHG as you would any other liquid soap. You can apply CHG directly to the skin and wash gently with a scrungie or a clean washcloth.   5. Apply the CHG Soap to your body ONLY FROM THE NECK DOWN.  Do not use on open wounds or open sores. Avoid contact with your eyes, ears, mouth and genitals (private parts). Wash Face and genitals (private parts)  with your normal soap.  6. Wash thoroughly, paying special attention to the area where your surgery will be performed.  7.  Thoroughly rinse your body with warm water from the neck down.  8. DO NOT shower/wash with your normal soap after using and rinsing off the CHG Soap.  9. Pat yourself dry with a CLEAN TOWEL.  10. Wear CLEAN PAJAMAS to bed the night before surgery, wear comfortable clothes the morning of surgery  11. Place CLEAN SHEETS on your bed the night of your first shower and DO NOT SLEEP WITH PETS.    Day of Surgery:  Do not apply any deodorants/lotions.  Please wear clean clothes to the hospital/surgery center.   Remember to brush your teeth WITH YOUR REGULAR TOOTHPASTE.    Please read over the following fact sheets that you were given. Coughing and Deep Breathing, MRSA Information and Surgical Site Infection Prevention

## 2018-08-04 ENCOUNTER — Encounter (HOSPITAL_COMMUNITY): Payer: Self-pay

## 2018-08-04 ENCOUNTER — Encounter (HOSPITAL_COMMUNITY)
Admission: RE | Admit: 2018-08-04 | Discharge: 2018-08-04 | Disposition: A | Discharge status: Home / Self Care | Payer: Commercial Managed Care - PPO | Source: Ambulatory Visit | Attending: Thoracic Surgery (Cardiothoracic Vascular Surgery) | Admitting: Thoracic Surgery (Cardiothoracic Vascular Surgery)

## 2018-08-04 ENCOUNTER — Other Ambulatory Visit (HOSPITAL_COMMUNITY): Payer: Commercial Managed Care - PPO

## 2018-08-04 ENCOUNTER — Ambulatory Visit (HOSPITAL_COMMUNITY)
Admission: RE | Admit: 2018-08-04 | Discharge: 2018-08-04 | Disposition: A | Discharge status: Home / Self Care | Payer: Commercial Managed Care - PPO | Source: Ambulatory Visit | Attending: Thoracic Surgery (Cardiothoracic Vascular Surgery) | Admitting: Thoracic Surgery (Cardiothoracic Vascular Surgery)

## 2018-08-04 ENCOUNTER — Other Ambulatory Visit: Payer: Self-pay

## 2018-08-04 ENCOUNTER — Ambulatory Visit: Payer: Commercial Managed Care - PPO | Admitting: Thoracic Surgery (Cardiothoracic Vascular Surgery)

## 2018-08-04 ENCOUNTER — Encounter: Payer: Self-pay | Admitting: Thoracic Surgery (Cardiothoracic Vascular Surgery)

## 2018-08-04 ENCOUNTER — Ambulatory Visit (HOSPITAL_BASED_OUTPATIENT_CLINIC_OR_DEPARTMENT_OTHER)
Admission: RE | Admit: 2018-08-04 | Discharge: 2018-08-04 | Disposition: A | Discharge status: Home / Self Care | Payer: Commercial Managed Care - PPO | Source: Ambulatory Visit | Attending: Thoracic Surgery (Cardiothoracic Vascular Surgery) | Admitting: Thoracic Surgery (Cardiothoracic Vascular Surgery)

## 2018-08-04 VITALS — BP 114/64 | HR 64 | Resp 20 | Ht 71.0 in | Wt 222.0 lb

## 2018-08-04 DIAGNOSIS — I2510 Atherosclerotic heart disease of native coronary artery without angina pectoris: Secondary | ICD-10-CM

## 2018-08-04 DIAGNOSIS — I350 Nonrheumatic aortic (valve) stenosis: Secondary | ICD-10-CM

## 2018-08-04 DIAGNOSIS — Z7951 Long term (current) use of inhaled steroids: Secondary | ICD-10-CM | POA: Diagnosis not present

## 2018-08-04 DIAGNOSIS — Z48812 Encounter for surgical aftercare following surgery on the circulatory system: Secondary | ICD-10-CM | POA: Diagnosis not present

## 2018-08-04 DIAGNOSIS — J9811 Atelectasis: Secondary | ICD-10-CM | POA: Diagnosis not present

## 2018-08-04 DIAGNOSIS — J849 Interstitial pulmonary disease, unspecified: Secondary | ICD-10-CM | POA: Insufficient documentation

## 2018-08-04 DIAGNOSIS — R9431 Abnormal electrocardiogram [ECG] [EKG]: Secondary | ICD-10-CM | POA: Insufficient documentation

## 2018-08-04 DIAGNOSIS — I25119 Atherosclerotic heart disease of native coronary artery with unspecified angina pectoris: Secondary | ICD-10-CM | POA: Diagnosis not present

## 2018-08-04 DIAGNOSIS — E785 Hyperlipidemia, unspecified: Secondary | ICD-10-CM | POA: Diagnosis not present

## 2018-08-04 DIAGNOSIS — Z01818 Encounter for other preprocedural examination: Secondary | ICD-10-CM

## 2018-08-04 DIAGNOSIS — F17210 Nicotine dependence, cigarettes, uncomplicated: Secondary | ICD-10-CM | POA: Diagnosis not present

## 2018-08-04 DIAGNOSIS — G5621 Lesion of ulnar nerve, right upper limb: Secondary | ICD-10-CM

## 2018-08-04 DIAGNOSIS — I110 Hypertensive heart disease with heart failure: Secondary | ICD-10-CM | POA: Diagnosis not present

## 2018-08-04 DIAGNOSIS — J449 Chronic obstructive pulmonary disease, unspecified: Secondary | ICD-10-CM | POA: Diagnosis not present

## 2018-08-04 DIAGNOSIS — G5622 Lesion of ulnar nerve, left upper limb: Secondary | ICD-10-CM | POA: Insufficient documentation

## 2018-08-04 DIAGNOSIS — I6523 Occlusion and stenosis of bilateral carotid arteries: Secondary | ICD-10-CM | POA: Insufficient documentation

## 2018-08-04 DIAGNOSIS — I351 Nonrheumatic aortic (valve) insufficiency: Secondary | ICD-10-CM | POA: Diagnosis not present

## 2018-08-04 DIAGNOSIS — I358 Other nonrheumatic aortic valve disorders: Secondary | ICD-10-CM | POA: Diagnosis not present

## 2018-08-04 DIAGNOSIS — I447 Left bundle-branch block, unspecified: Secondary | ICD-10-CM | POA: Diagnosis not present

## 2018-08-04 DIAGNOSIS — I082 Rheumatic disorders of both aortic and tricuspid valves: Secondary | ICD-10-CM | POA: Diagnosis not present

## 2018-08-04 DIAGNOSIS — D62 Acute posthemorrhagic anemia: Secondary | ICD-10-CM | POA: Diagnosis not present

## 2018-08-04 DIAGNOSIS — I5032 Chronic diastolic (congestive) heart failure: Secondary | ICD-10-CM | POA: Diagnosis not present

## 2018-08-04 DIAGNOSIS — Z79899 Other long term (current) drug therapy: Secondary | ICD-10-CM | POA: Diagnosis not present

## 2018-08-04 LAB — PULMONARY FUNCTION TEST
DL/VA % pred: 80 %
DL/VA: 3.74 ml/min/mmHg/L
DLCO UNC % PRED: 70 %
DLCO UNC: 23.77 ml/min/mmHg
FEF 25-75 POST: 1.33 L/s
FEF 25-75 Pre: 0.96 L/sec
FEF2575-%Change-Post: 39 %
FEF2575-%PRED-PRE: 33 %
FEF2575-%Pred-Post: 46 %
FEV1-%Change-Post: 6 %
FEV1-%PRED-POST: 65 %
FEV1-%Pred-Pre: 61 %
FEV1-POST: 2.35 L
FEV1-Pre: 2.2 L
FEV1FVC-%Change-Post: 0 %
FEV1FVC-%PRED-PRE: 78 %
FEV6-%Change-Post: 9 %
FEV6-%PRED-POST: 82 %
FEV6-%Pred-Pre: 75 %
FEV6-POST: 3.77 L
FEV6-Pre: 3.45 L
FEV6FVC-%CHANGE-POST: 1 %
FEV6FVC-%PRED-POST: 97 %
FEV6FVC-%Pred-Pre: 96 %
FVC-%Change-Post: 7 %
FVC-%PRED-POST: 84 %
FVC-%PRED-PRE: 77 %
FVC-PRE: 3.75 L
FVC-Post: 4.04 L
Post FEV1/FVC ratio: 58 %
Post FEV6/FVC ratio: 93 %
Pre FEV1/FVC ratio: 59 %
Pre FEV6/FVC Ratio: 92 %
RV % PRED: 173 %
RV: 4.12 L
TLC % pred: 111 %
TLC: 8.07 L

## 2018-08-04 LAB — CBC
HCT: 46.2 % (ref 39.0–52.0)
Hemoglobin: 15.1 g/dL (ref 13.0–17.0)
MCH: 31.3 pg (ref 26.0–34.0)
MCHC: 32.7 g/dL (ref 30.0–36.0)
MCV: 95.9 fL (ref 80.0–100.0)
NRBC: 0 % (ref 0.0–0.2)
Platelets: 197 10*3/uL (ref 150–400)
RBC: 4.82 MIL/uL (ref 4.22–5.81)
RDW: 12.9 % (ref 11.5–15.5)
WBC: 9.4 10*3/uL (ref 4.0–10.5)

## 2018-08-04 LAB — URINALYSIS, ROUTINE W REFLEX MICROSCOPIC
BILIRUBIN URINE: NEGATIVE
Bacteria, UA: NONE SEEN
Glucose, UA: NEGATIVE mg/dL
KETONES UR: NEGATIVE mg/dL
Leukocytes, UA: NEGATIVE
Nitrite: NEGATIVE
PH: 5 (ref 5.0–8.0)
Protein, ur: NEGATIVE mg/dL
Specific Gravity, Urine: 1.012 (ref 1.005–1.030)

## 2018-08-04 LAB — COMPREHENSIVE METABOLIC PANEL
ALBUMIN: 4 g/dL (ref 3.5–5.0)
ALK PHOS: 55 U/L (ref 38–126)
ALT: 33 U/L (ref 0–44)
AST: 19 U/L (ref 15–41)
Anion gap: 9 (ref 5–15)
BILIRUBIN TOTAL: 0.7 mg/dL (ref 0.3–1.2)
BUN: 15 mg/dL (ref 8–23)
CALCIUM: 9.1 mg/dL (ref 8.9–10.3)
CHLORIDE: 109 mmol/L (ref 98–111)
CO2: 20 mmol/L — ABNORMAL LOW (ref 22–32)
CREATININE: 0.72 mg/dL (ref 0.61–1.24)
GFR calc Af Amer: >60 mL/min (ref >60)
Glucose, Bld: 105 mg/dL — ABNORMAL HIGH (ref 70–99)
Potassium: 3.7 mmol/L (ref 3.5–5.1)
SODIUM: 138 mmol/L (ref 135–145)
TOTAL PROTEIN: 6.7 g/dL (ref 6.5–8.1)

## 2018-08-04 LAB — APTT: APTT: 29 s (ref 24–36)

## 2018-08-04 LAB — BLOOD GAS, ARTERIAL
Acid-Base Excess: 0.7 mmol/L (ref 0.0–2.0)
Bicarbonate: 24.4 mmol/L (ref 20.0–28.0)
O2 Saturation: 97.1 %
PATIENT TEMPERATURE: 98.6
PCO2 ART: 36.8 mmHg (ref 32.0–48.0)
PH ART: 7.437 (ref 7.350–7.450)
pO2, Arterial: 92.4 mmHg (ref 83.0–108.0)

## 2018-08-04 LAB — SURGICAL PCR SCREEN
MRSA, PCR: NEGATIVE
STAPHYLOCOCCUS AUREUS: POSITIVE — AB

## 2018-08-04 LAB — HEMOGLOBIN A1C
HEMOGLOBIN A1C: 6 % — AB (ref 4.8–5.6)
Mean Plasma Glucose: 125.5 mg/dL

## 2018-08-04 LAB — ABO/RH: ABO/RH(D): O POS

## 2018-08-04 LAB — PROTIME-INR
INR: 1.08
Prothrombin Time: 13.9 seconds (ref 11.4–15.2)

## 2018-08-04 MED ORDER — ALBUTEROL SULFATE (2.5 MG/3ML) 0.083% IN NEBU
2.5000 mg | INHALATION_SOLUTION | Freq: Once | RESPIRATORY_TRACT | Status: AC
  Administered 2018-08-04: 2.5 mg via RESPIRATORY_TRACT

## 2018-08-04 NOTE — Progress Notes (Signed)
PCP - Dr. Orvan Falconer Cardiologist -  Dr. Cornelius Moras   Chest x-ray - 08/04/18 EKG - 08/04/18 ECHO - 06/2018 Cardiac Cath - 06/2018  Blood Thinner Instructions: N/A  Aspirin Instructions: will take up until DOS  Anesthesia review: yes. Cardiac hx  Patient denies shortness of breath, fever, cough and chest pain at PAT appointment   Patient verbalized understanding of instructions that were given to them at the PAT appointment. Patient was also instructed that they will need to review over the PAT instructions again at home before surgery.

## 2018-08-04 NOTE — Patient Instructions (Signed)
Stop taking aspirin  Continue taking all other medications without change through the day before surgery.  Have nothing to eat or drink after midnight the night before surgery.  On the morning of surgery do not take any medications    

## 2018-08-04 NOTE — Progress Notes (Signed)
[address]       [place] [address]             [phone]     CARDIOTHORACIC SURGERY OFFICE NOTE  Referring Provider is Lyn Records, MD  Primary Cardiologist is Revankar, Aundra Dubin, MD PCP is Wilmer Floor., MD   HPI:  Patient is a 62 year old male with history of aortic stenosis, hypertension, hyperlipidemia, and long-standing tobacco abuse who returns to the office today for follow-up of severe symptomatic aortic stenosis and single-vessel coronary artery disease with tentative plans to proceed with aortic valve replacement and coronary artery bypass grafting later this week.  He was originally seen in consultation on July 20, 2018.  Since then he reports no new problems.  He describes stable symptoms of exertional shortness of breath and chest tightness.  He is looking forward to proceeding with surgery.   Current Outpatient Medications  Medication Sig Dispense Refill  . albuterol (PROVENTIL HFA;VENTOLIN HFA) 108 (90 Base) MCG/ACT inhaler Inhale 2 puffs into the lungs every 6 (six) hours as needed for wheezing or shortness of breath.     Marland Kitchen aspirin EC 81 MG tablet Take 1 tablet (81 mg total) by mouth daily. 90 tablet 3  . atorvastatin (LIPITOR) 40 MG tablet Take 1 tablet (40 mg total) by mouth daily. 30 tablet 11  . HYDROcodone-acetaminophen (NORCO) 5-325 MG tablet Take 1 tablet by mouth every 4 (four) hours as needed for moderate pain or severe pain. 20 tablet 0  . lisinopril-hydrochlorothiazide (PRINZIDE,ZESTORETIC) 20-12.5 MG tablet Take 2 tablets by mouth daily.     . valACYclovir (VALTREX) 500 MG tablet Take 500 mg by mouth daily as needed (cold sores).      No current facility-administered medications for this visit.       Physical Exam:   BP 114/64   Pulse 64   Resp 20   Ht 5\' 11"  (1.803 m)   Wt 222 lb (100.7 kg)   SpO2 96% Comment: RA  BMI 30.96 kg/m   General:  Well-appearing  Chest:   Clear to auscultation  CV:   Regular  rate and rhythm with prominent systolic murmur heard best along the right sternal border  Incisions:  n/a  Abdomen:  Soft nontender  Extremities:  Warm and well-perfused  Diagnostic Tests:  Cardiac TAVR CT  TECHNIQUE: The patient was scanned on a [employer]. A 120 kV retrospective scan was triggered in the descending thoracic aorta at 111 HU's. Gantry rotation speed was 250 msecs and collimation was .6 mm. No beta blockade or nitro were given. The 3D data set was reconstructed in 5% intervals of the R-R cycle. Systolic and diastolic phases were analyzed on a dedicated work station using MPR, MIP and VRT modes. The patient received 80 cc of contrast.  FINDINGS: Aortic Valve: Trileaflet, but functionally bileaflet aortic valve with co-joined left and right leaflet and severe calcifications of the leaflets. No calcifications are extending into the LVOT.  Aorta: Normal size with moderate calcifications i the aortic arch and no dissection.  Sinotubular Junction: 29 x 29 mm  Ascending Thoracic Aorta: 36 x 35 mm  Aortic Arch: 25 x 24 mm  Descending Thoracic Aorta: 27 x 26 mm  Sinus of Valsalva Measurements:  Non-coronary: 33 mm  Right -coronary: 31 mm  Left -coronary: 31 mm  Coronary Artery Height above Annulus:  Left Main: 13.5 mm  Right Coronary: 18.1 mm  Virtual Basal Annulus Measurements:  Maximum/Minimum Diameter: 30.0 x 22.1 mm  Mean Diameter: 25.7 mm  Perimeter: 82.8 mm  Area: 519 mm2  Optimum Fluoroscopic Angle for Delivery: LAO 7 CAU 10  IMPRESSION: 1. Trileaflet, but functionally bileaflet aortic valve with co-joined left and right leaflet and severe calcifications of the leaflets. No calcifications are extending into the LVOT.  2. Sufficient coronary to annulus distance.  3. Optimum Fluoroscopic Angle for Delivery: LAO 7 CAU 10  4. No thrombus in the left atrial appendage.   Electronically Signed   By:  Tobias Alexander   On: 08/02/2018 16:29       CT ANGIOGRAPHY CHEST, ABDOMEN AND PELVIS  TECHNIQUE: Multidetector CT imaging through the chest, abdomen and pelvis was performed using the standard protocol during bolus administration of intravenous contrast. Multiplanar reconstructed images and MIPs were obtained and reviewed to evaluate the vascular anatomy.  CONTRAST:  ISOVUE-370 IOPAMIDOL (ISOVUE-370) INJECTION 76%  COMPARISON:  12/22/2003  FINDINGS: CTA CHEST FINDINGS  Cardiovascular: Heart size normal. No pericardial effusion. Dilated central pulmonary arteries. Fairly good contrast opacification of the pulmonary arterial tree; the exam was not optimized for detection of pulmonary emboli.  Scattered coronary calcifications. Heavy aortic leaflet calcifications. Adequate contrast opacification of the thoracic aorta with no evidence of dissection, aneurysm, or stenosis. There is classic 3-vessel brachiocephalic arch anatomy without proximal stenosis. Calcified atheromatous plaque in the arch.  Mediastinum/Nodes: No hilar or mediastinal adenopathy.  Lungs/Pleura: No pleural effusion. No pneumothorax. Previous right thoracotomy with surgical staple line in the posterior right lower lobe. Adjacent coarse interstitial and airspace opacities in the posterior right upper lobe. Linear scarring or subsegmental atelectasis in the posterior left lower lobe and inferior lingula. Emphysematous changes most evident in the upper lobes.  Musculoskeletal: Small focus of right lower lobe herniation posteriorly into the intercostal space below the right eighth rib at the level of prior surgery. Spondylitic changes in the visualized lower cervical spine. Anterior vertebral endplate spurring at multiple levels in the lower thoracic spine. No fracture or worrisome bone  Review of the MIP images confirms the above findings.  CTA ABDOMEN AND PELVIS  FINDINGS  VASCULAR  Aorta: Moderate partially calcified atheromatous plaque particularly in the infrarenal segment, ectatic up to 3 cm at the level of the bifurcation, with some scattered eccentric nonocclusive mural thrombus. No stenosis.  Celiac: Short-segment origin stenosis at the level of the median arcuate ligament of the diaphragm, patent distally.  SMA: Calcified nonocclusive ostial plaque. Patent distally. Replaced right hepatic arterial supply, an anatomic variant.  Renals: Single right, with scattered calcified plaque, no high-grade stenosis. Duplicated left, superior dominant, both with scattered calcified nonocclusive plaque.  IMA: Patent without evidence of aneurysm, dissection, vasculitis or significant stenosis.  Inflow: Ectatic proximal left common iliac artery up to 2 cm diameter. Origin stenosis of the left internal iliac artery with somewhat saccular 1.4 cm aneurysmal segment just beyond the origin, containing some mural thrombus. Heavy calcified plaque through the left external iliac artery with focal stenosis of in its midportion of possible hemodynamic significance.  Fusiform dilatation of the distal right common iliac artery up to 2.5 cm diameter. Short-segment origin stenosis of the internal iliac artery. Heavily calcified eccentric plaque through the right external iliac without high-grade stenosis. No significant tortuosity of the iliac arterial system.  Visualized portions of proximal outflow have scattered calcified plaque but no aneurysm or stenosis.  Veins: No obvious venous abnormality within the limitations of this arterial phase study.  Review of the MIP images  confirms the above findings.  NON-VASCULAR  Hepatobiliary: No focal liver abnormality is seen. No gallstones, gallbladder wall thickening, or biliary dilatation.  Pancreas: Unremarkable. No pancreatic ductal dilatation or surrounding inflammatory  changes.  Spleen: Normal in size without focal abnormality.  Adrenals/Urinary Tract: Adrenal glands are unremarkable. Kidneys are normal, without renal calculi, focal lesion, or hydronephrosis. Bladder is incompletely distended, with eccentric mural thickening along its right anterior wall, with 1 cm nodular extension into the extraperitoneal fat.  Stomach/Bowel: Stomach and small bowel are nondilated. Appendix not discretely identified. No pericecal inflammatory/edematous change. The colon is nondilated. Probable internal hemorrhoids.  Lymphatic: No abdominal or pelvic adenopathy.  Reproductive: Moderate prostatic enlargement with coarse central calcifications.  Other: No ascites.  No free air.  Musculoskeletal: Small umbilical hernia containing mesenteric fat. Spondylitic changes in the lumbar spine. No fracture or worrisome bone lesion.  Review of the MIP images confirms the above findings.  IMPRESSION: 1. Infrarenal abdominal aortic aneurysm up to 3 cm diameter, with 2.5 cm dilatation of right common iliac artery, 2 cm left internal iliac artery. Recommend followup by [place] in 3 years. This recommendation follows ACR consensus guidelines: White Paper of the ACR Incidental Findings Committee II on Vascular Findings. J Am Coll Radiol 2013; 81:191-478 2. Short-segment stenosis in the mid left external iliac artery of possible hemodynamic significance. Consider correlation with with lower extremity segmental studies. 3. Eccentric wall thickening anteriorly in the urinary bladder with extension into the extraperitoneal space. Consider urologic evaluation to exclude neoplasm. 4. Postop changes in the right lower lobe. Adjacent parenchymal changes may be secondary to prior radiation, correlate with clinical history.  Electronically Signed: By: Corlis Leak M.D. On: 07/31/2018 14:04    Impression:  Patient has stage D severe symptomatic aortic stenosis with  single-vessel coronary artery disease.  He presents with a several month history of worsening symptoms of exertional chest tightness and shortness of breath consistent with stable angina pectoris and chronic diastolic congestive heart failure, [place] Heart Association functional class II.  I have personally reviewed the patient's recent transthoracic echocardiogram, diagnostic cardiac catheterization, and CT angiograms.  Echocardiogram confirmed the presence of severe aortic stenosis.  The aortic valve has severe thickening, calcification, and restricted leaflet mobility involving all 3 leaflets.  Peak velocity across the aortic valve measured 4.7 m/s corresponding to mean transvalvular gradient greater than 50 mmHg.  Left ventricular systolic function remains normal.  There is left ventricular hypertrophy with significant diastolic dysfunction.  Catheterization confirmed the presence of severe aortic stenosis and revealed significant single-vessel coronary artery disease with stenosis of the mid right coronary artery.    CT angiography confirms the fact that the patient has a bicuspid aortic valve (Sievers type I) and reveals normal size and anatomical characteristics of the aortic root and ascending thoracic aorta.  Risks associated with conventional surgical aortic valve replacement and coronary artery bypass grafting should be reasonably low.  Given the patient's relatively young age, conventional surgery would probably be associated with best long-term outcome.     Plan:  The patient and his wife were again counseled at length regarding treatment alternatives for management of severe symptomatic aortic stenosis and coronary artery disease. Alternative approaches such as conventional aortic valve replacement with coronary artery bypass grafting, transcatheter aortic valve replacement with or without PCI and stenting of the right coronary artery, and continued medical therapy without intervention were  compared and contrasted at length.  The risks associated with conventional surgical aortic valve replacement were discussed  in detail, as were expectations for post-operative convalescence.  Issues specific to transcatheter aortic valve replacement were discussed including questions about long term valve durability, the potential for paravalvular leak, possible increased risk of need for permanent pacemaker placement, and other technical complications related to the procedure itself.  Long-term prognosis with medical therapy was discussed. This discussion was placed in the context of the patient's own specific clinical presentation and past medical history.  A  The patient and his wife understand and accept all potential risks of surgery including but not limited to risk of death, stroke or other neurologic complication, myocardial infarction, congestive heart failure, respiratory failure, renal failure, bleeding requiring transfusion and/or reexploration, arrhythmia, infection or other wound complications, pneumonia, pleural and/or pericardial effusion, pulmonary embolus, aortic dissection or other major vascular complication, or delayed complications related to valve repair or replacement including but not limited to structural valve deterioration and failure, thrombosis, embolization, endocarditis, paravalvular leak, or late recurrence of symptomatic ischemic heart disease.  All of their questions have been answered.    I spent in excess of 15 minutes during the conduct of this office consultation and >50% of this time involved direct face-to-face encounter with the patient for counseling and/or coordination of their care.    Salvatore Decent. Cornelius Moras, MD 08/04/2018 4:36 PM

## 2018-08-04 NOTE — Progress Notes (Signed)
Pre-op Cardiac Surgery  Carotid Findings:  ICA1-39% stenosis  Upper Extremity Right Left  Brachial Pressures 127 119  Radial Waveforms triphasic triphasic  Ulnar Waveforms triphasic triphasic  Palmar Arch (Allen's Test) Radial: obliterate w/ comp Ulnar: WNL Radial: obliterate w/ comp Ulnar: WNL      Lower  Extremity Right Left  Dorsalis Pedis triphasic triphasic      Posterior Tibial triphasic triphasic        Blanch Media 08/04/2018 11:47 AM

## 2018-08-05 LAB — TYPE AND SCREEN
ABO/RH(D): O POS
Antibody Screen: NEGATIVE

## 2018-08-05 MED ORDER — SODIUM CHLORIDE 0.9 % IV SOLN
750.0000 mg | INTRAVENOUS | Status: DC
  Filled 2018-08-05: qty 750

## 2018-08-05 MED ORDER — KENNESTONE BLOOD CARDIOPLEGIA VIAL
13.0000 mL | Freq: Once | Status: DC
  Filled 2018-08-05: qty 13

## 2018-08-05 MED ORDER — MILRINONE LACTATE IN DEXTROSE 20-5 MG/100ML-% IV SOLN
0.3000 ug/kg/min | INTRAVENOUS | Status: DC
  Filled 2018-08-05: qty 100

## 2018-08-05 MED ORDER — SODIUM CHLORIDE 0.9 % IV SOLN
INTRAVENOUS | Status: DC
  Filled 2018-08-05: qty 30

## 2018-08-05 MED ORDER — SODIUM CHLORIDE 0.9 % IV SOLN
1.5000 g | INTRAVENOUS | Status: AC
  Administered 2018-08-06: 1.5 g via INTRAVENOUS
  Filled 2018-08-05: qty 1.5

## 2018-08-05 MED ORDER — TRANEXAMIC ACID (OHS) PUMP PRIME SOLUTION
2.0000 mg/kg | INTRAVENOUS | Status: DC
  Filled 2018-08-05: qty 2.01

## 2018-08-05 MED ORDER — DOPAMINE-DEXTROSE 3.2-5 MG/ML-% IV SOLN
0.0000 ug/kg/min | INTRAVENOUS | Status: DC
  Filled 2018-08-05: qty 250

## 2018-08-05 MED ORDER — MAGNESIUM SULFATE 50 % IJ SOLN
40.0000 meq | INTRAMUSCULAR | Status: DC
  Filled 2018-08-05: qty 9.85

## 2018-08-05 MED ORDER — KENNESTONE BLOOD CARDIOPLEGIA (KBC) MANNITOL SYRINGE (20%, 32ML)
32.0000 mL | Freq: Once | INTRAVENOUS | Status: DC
  Filled 2018-08-05: qty 32

## 2018-08-05 MED ORDER — VANCOMYCIN HCL 1000 MG IV SOLR
INTRAVENOUS | Status: AC
  Administered 2018-08-06: 1000 mL
  Filled 2018-08-05: qty 1000

## 2018-08-05 MED ORDER — VANCOMYCIN HCL 10 G IV SOLR
1500.0000 mg | INTRAVENOUS | Status: AC
  Administered 2018-08-06: 1500 mg via INTRAVENOUS
  Filled 2018-08-05: qty 1500

## 2018-08-05 MED ORDER — POTASSIUM CHLORIDE 2 MEQ/ML IV SOLN
80.0000 meq | INTRAVENOUS | Status: DC
  Filled 2018-08-05: qty 40

## 2018-08-05 MED ORDER — EPINEPHRINE PF 1 MG/ML IJ SOLN
0.0000 ug/min | INTRAVENOUS | Status: DC
  Filled 2018-08-05: qty 4

## 2018-08-05 MED ORDER — NOREPINEPHRINE 4 MG/250ML-% IV SOLN
0.0000 ug/min | INTRAVENOUS | Status: DC
  Filled 2018-08-05: qty 250

## 2018-08-05 MED ORDER — PLASMA-LYTE 148 IV SOLN
INTRAVENOUS | Status: AC
  Administered 2018-08-06: 500 mL
  Filled 2018-08-05: qty 2.5

## 2018-08-05 MED ORDER — NITROGLYCERIN IN D5W 200-5 MCG/ML-% IV SOLN
2.0000 ug/min | INTRAVENOUS | Status: AC
  Administered 2018-08-06: 10 ug/min via INTRAVENOUS
  Filled 2018-08-05: qty 250

## 2018-08-05 MED ORDER — PHENYLEPHRINE HCL-NACL 20-0.9 MG/250ML-% IV SOLN
30.0000 ug/min | INTRAVENOUS | Status: AC
  Administered 2018-08-06: 20 ug/min via INTRAVENOUS
  Filled 2018-08-05: qty 250

## 2018-08-05 MED ORDER — DEXMEDETOMIDINE HCL IN NACL 400 MCG/100ML IV SOLN
0.1000 ug/kg/h | INTRAVENOUS | Status: AC
  Administered 2018-08-06: .5 ug/kg/h via INTRAVENOUS
  Filled 2018-08-05: qty 100

## 2018-08-05 MED ORDER — TRANEXAMIC ACID (OHS) BOLUS VIA INFUSION
15.0000 mg/kg | INTRAVENOUS | Status: AC
  Administered 2018-08-06: 1510.5 mg via INTRAVENOUS
  Filled 2018-08-05: qty 1511

## 2018-08-05 MED ORDER — TRANEXAMIC ACID 1000 MG/10ML IV SOLN
1.5000 mg/kg/h | INTRAVENOUS | Status: AC
  Administered 2018-08-06: 1.5 mg/kg/h via INTRAVENOUS
  Filled 2018-08-05: qty 25

## 2018-08-05 MED ORDER — INSULIN REGULAR(HUMAN) IN NACL 100-0.9 UT/100ML-% IV SOLN
INTRAVENOUS | Status: AC
  Administered 2018-08-06: 1.2 [IU]/h via INTRAVENOUS
  Filled 2018-08-05: qty 100

## 2018-08-05 NOTE — H&P (Signed)
[address]       [place] [address]             [phone]          CARDIOTHORACIC SURGERY HISTORY AND PHYSICAL EXAM  Referring Provider is Lyn Records, MD  Primary Cardiologist is Revankar, Aundra Dubin, MD PCP is Wilmer Floor., MD      Chief Complaint  Patient presents with  . Aortic Stenosis    new patient consultation, ECHO, Cath to be reviewed  . New Patient (Initial Visit)    HPI:  Patient is a 62 year old male with history of aortic stenosis, hypertension, hyperlipidemia, and long-standing tobacco abuse who has been referred for surgical consultation to discuss treatment options for management of severe symptomatic aortic stenosis and single-vessel coronary artery disease.  Patient states that his primary care physician noted a heart murmur on physical exam several years ago.  Over the past several months the patient has developed symptoms of exertional shortness of breath and chest tightness.  The patient lives a relatively sedentary lifestyle and drives a truck for living.  Despite this he has developed increasing symptoms of exertional chest tightness associated with exertional shortness of breath.  He was seen by his primary care physician and referred for cardiology consultation.  He was evaluated by Dr. Tomie China and promptly scheduled for transthoracic echocardiogram and diagnostic cardiac catheterization.  Echocardiogram performed June 29, 2018 revealed severe, essentially critical, aortic stenosis with preserved left ventricular systolic function.  Peak velocity across the aortic valve measured 4.7 m/s corresponding to mean transvalvular gradient estimated 54 mmHg.  The DVI was quite low at 0.16 with aortic valve area calculated 0.54 cm.  Left ventricular systolic function remains preserved with ejection fraction estimated 60 to 65%.  There was moderate left ventricular hypertrophy with significant diastolic dysfunction.  Left and  right heart catheterization was performed the following day by Dr. Katrinka Blazing.  Catheterization confirmed the presence of critical aortic stenosis with peak to peak and mean transvalvular gradients measured 76 and 64 mmHg respectively, corresponding to aortic valve area calculated 0.91 cm.  The patient was also noted to have significant single-vessel coronary artery disease with 60 to 75% hazy stenosis of the mid right coronary artery.  Right heart pressures were normal.  Cardiothoracic surgical consultation was requested.  Patient is married and lives with his wife in [place].  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  He does not exercise on a regular basis.  He does state that over the past 2 weeks he has been working around the house and doing chores in the yard without significant limitations, although he does get exertional chest tightness and shortness of breath.  Symptoms are always relieved by rest.  He denies any nocturnal angina or resting shortness of breath.  He denies any history of PND, orthopnea, or lower extremity edema.  He has not had palpitations, dizzy spells, nor syncope.  Patient is a 62 year old male with history of aortic stenosis, hypertension, hyperlipidemia, and long-standing tobacco abuse who returns to the office today for follow-up of severe symptomatic aortic stenosis and single-vessel coronary artery disease with tentative plans to proceed with aortic valve replacement and coronary artery bypass grafting later this week.  He was originally seen in consultation on July 20, 2018.  Since then he reports no new problems.  He describes stable symptoms of exertional shortness of breath  and chest tightness.  He is looking forward to proceeding with surgery.  Past Medical History:  Diagnosis Date  . Anal fissure   . Aortic stenosis   . Bilateral carotid bruits   . COPD (chronic obstructive pulmonary disease) (HCC)   .  Coronary artery disease involving native coronary artery of native heart with angina pectoris (HCC)   . Dyslipidemia   . Dyspnea    when I first get started moving  around -  then it goes away  . ED (erectile dysfunction)   . Heart murmur   . Hemorrhoids   . Hypertension   . Hypogonadism in male   . Leg cramps   . Pain of left shoulder region   . Petechiae   . Pneumonia 2016 ish  . Smoking   . Vitamin D deficiency     Past Surgical History:  Procedure Laterality Date  . APPENDECTOMY    . COLONOSCOPY W/ POLYPECTOMY     x 2  . Lung Biospy Right    lower lung  . RIGHT/LEFT HEART CATH AND CORONARY ANGIOGRAPHY N/A 06/30/2018   Procedure: RIGHT/LEFT HEART CATH AND CORONARY ANGIOGRAPHY;  Surgeon: Lyn Records, MD;  Location: [facility];  Service: Cardiovascular;  Laterality: N/A;  . TOOTH EXTRACTION N/A 07/30/2018   Procedure: Extraction of tooth #5 w/ Alveoloplasty and Gross debridement of remaining dentition.;  Surgeon: Charlynne Pander, DDS;  Location: [facility] OR;  Service: Oral Surgery;  Laterality: N/A;    Family History  Problem Relation Age of Onset  . Hypertension Mother   . Coronary artery disease Mother   . Hypertension Father     Social History Social History   Tobacco Use  . Smoking status: Current Every Day Smoker    Packs/day: 1.00    Years: 50.00    Pack years: 50.00  . Smokeless tobacco: Never Used  Substance Use Topics  . Alcohol use: Yes    Alcohol/week: 14.0 standard drinks    Types: 14 Shots of liquor per week    Comment: Bacardi rum daily  . Drug use: Never    Prior to Admission medications   Medication Sig Start Date End Date Taking? Authorizing Provider  albuterol (PROVENTIL HFA;VENTOLIN HFA) 108 (90 Base) MCG/ACT inhaler Inhale 2 puffs into the lungs every 6 (six) hours as needed for wheezing or shortness of breath.    Yes [provider]  aspirin EC 81 MG tablet Take 1 tablet (81 mg total) by mouth daily. 06/29/18  Yes Revankar,  Aundra Dubin, MD  atorvastatin (LIPITOR) 40 MG tablet Take 1 tablet (40 mg total) by mouth daily. 06/30/18 06/30/19 Yes Lyn Records, MD  lisinopril-hydrochlorothiazide (PRINZIDE,ZESTORETIC) 20-12.5 MG tablet Take 2 tablets by mouth daily.    Yes [provider]  valACYclovir (VALTREX) 500 MG tablet Take 500 mg by mouth daily as needed (cold sores).    Yes [provider]  HYDROcodone-acetaminophen (NORCO) 5-325 MG tablet Take 1 tablet by mouth every 4 (four) hours as needed for moderate pain or severe pain. 07/30/18   Charlynne Pander, DDS    No Known Allergies   Review of Systems:              General:                      normal appetite, decreased energy, no weight gain, no weight loss, no fever  Cardiac:                       + chest pain with exertion, no chest pain at rest, + SOB with exertion, no resting SOB, no PND, no orthopnea, no palpitations, no arrhythmia, no atrial fibrillation, no LE edema, no dizzy spells, no syncope             Respiratory:                 no shortness of breath, no home oxygen, no productive cough, + dry cough, no bronchitis, + wheezing, no hemoptysis, no asthma, no pain with inspiration or cough, no sleep apnea, no CPAP at night             GI:                               no difficulty swallowing, no reflux, no frequent heartburn, no hiatal hernia, no abdominal pain, no constipation, no diarrhea, no hematochezia, no hematemesis, no melena             GU:                              no dysuria,  no frequency, no urinary tract infection, no hematuria, no enlarged prostate, no kidney stones, no kidney disease             Vascular:                     no pain suggestive of claudication, no pain in feet, no leg cramps, no varicose veins, no DVT, no non-healing foot ulcer             Neuro:                         no stroke, no TIA's, no seizures, no headaches, no temporary blindness one eye,  no slurred speech, no peripheral neuropathy,  no chronic pain, no instability of gait, no memory/cognitive dysfunction             Musculoskeletal:         + arthritis, no joint swelling, no myalgias, no difficulty walking, normal mobility              Skin:                            no rash, no itching, no skin infections, no pressure sores or ulcerations             Psych:                         no anxiety, no depression, no nervousness, no unusual recent stress             Eyes:                           no blurry vision, no floaters, no recent vision changes, + wears glasses for reading only             ENT:  no hearing loss, no loose or painful teeth, no dentures, last saw dentist 3 years ago             Hematologic:               no easy bruising, no abnormal bleeding, no clotting disorder, no frequent epistaxis             Endocrine:                   no diabetes, does not check CBG's at home                                                       Physical Exam:              BP (!) 142/78 (BP Location: Right Arm, Patient Position: Sitting, Cuff Size: Normal)   Pulse 62   Resp 18   Ht 5\' 11"  (1.803 m)   Wt 227 lb 3.2 oz (103.1 kg)   SpO2 97% Comment: RA  BMI 31.69 kg/m              General:                        well-appearing             HEENT:                       Unremarkable              Neck:                           no JVD, no bruits, no adenopathy              Chest:                          clear to auscultation, symmetrical breath sounds, no wheezes, no rhonchi              CV:                              RRR, grade III/VI crescendo/decrescendo murmur heard best at RSB,  no diastolic murmur             Abdomen:                    soft, non-tender, no masses              Extremities:                 warm, well-perfused, pulses diminished but palpable, no LE edema             Rectal/GU                   Deferred             Neuro:                         Grossly non-focal and  symmetrical throughout             Skin:  Clean and dry, no rashes, no breakdown   Diagnostic Tests:  Transthoracic Echocardiography  Patient: Mareo, Portilla MR #: [id number] Study Date: 06/29/2018 Gender: M Age: 35 Height: 180.3 cm Weight: 103.6 kg BSA: 2.31 m^2 Pt. Status: Room:  Ethelle Lyon, Mila Homer ATTENDING Belva Crome, MD ORDERING Belva Crome, MD REFERRING Belva Crome, MD SONOGRAPHER Lanae Crumbly, RDCS PERFORMING [facility], [place]  cc:  ------------------------------------------------------------------- LV EF: 60% - 65%  ------------------------------------------------------------------- Indications: Chest pain 786.51.  ------------------------------------------------------------------- History: PMH: Aortic stenosis. Risk factors: Current tobacco use. Hypertension. Dyslipidemia.  ------------------------------------------------------------------- Study Conclusions  - Left ventricle: The cavity size was normal. Wall thickness was increased in a pattern of moderate LVH. Systolic function was normal. The estimated ejection fraction was in the range of 60% to 65%. Wall motion was normal; there were no regional wall motion abnormalities. - Aortic valve: There was critical stenosis. Valve area (VTI): 0.54 cm^2. Valve area (Vmax): 0.56 cm^2. Valve area (Vmean): 0.53 cm^2.  Impressions:  - 1. Critical aortic stenosis. Please see detailed measurements in the body of the report. 2. Concentric left ventricular hypertrophy with preserved left ventricular systolic function.  ------------------------------------------------------------------- Study data: No prior study was available for comparison. Study status: Routine. Procedure: The patient reported no pain pre or post test. Transthoracic echocardiography. Image  quality was adequate. Study completion: There were no complications. Transthoracic echocardiography. M-mode, complete 2D, spectral Doppler, and color Doppler. Birthdate: Patient birthdate: 09-18-1956. Age: Patient is 62 yr old. Sex: Gender: male. BMI: 31.9 kg/m^2. Blood pressure: 127/62 Patient status: Outpatient. Study date: Study date: 06/29/2018. Study time: 10:23 AM. Location: Echo laboratory.  -------------------------------------------------------------------  ------------------------------------------------------------------- Left ventricle: The cavity size was normal. Wall thickness was increased in a pattern of moderate LVH. Systolic function was normal. The estimated ejection fraction was in the range of 60% to 65%. Wall motion was normal; there were no regional wall motion abnormalities.  ------------------------------------------------------------------- Aortic valve: Doppler: There was critical stenosis. There was no regurgitation. VTI ratio of LVOT to aortic valve: 0.16. Valve area (VTI): 0.54 cm^2. Indexed valve area (VTI): 0.23 cm^2/m^2. Peak velocity ratio of LVOT to aortic valve: 0.16. Valve area (Vmax): 0.56 cm^2. Indexed valve area (Vmax): 0.24 cm^2/m^2. Mean velocity ratio of LVOT to aortic valve: 0.15. Valve area (Vmean): 0.53 cm^2. Indexed valve area (Vmean): 0.23 cm^2/m^2. Mean gradient (S): 54 mm Hg. Peak gradient (S): 88 mm Hg.  ------------------------------------------------------------------- Aorta: Aortic root: The aortic root was normal in size.  ------------------------------------------------------------------- Mitral valve: Structurally normal valve. Mobility was not restricted. Doppler: Transvalvular velocity was within the normal range. There was no evidence for stenosis. There was no regurgitation. Valve area by pressure half-time: 3.38 cm^2. Indexed valve area by pressure half-time: 1.46 cm^2/m^2.  Peak gradient (D): 5 mm Hg.  ------------------------------------------------------------------- Left atrium: The atrium was normal in size.  ------------------------------------------------------------------- Right ventricle: The cavity size was normal. Wall thickness was normal. Systolic function was normal.  ------------------------------------------------------------------- Pulmonic valve: Doppler: Transvalvular velocity was within the normal range. There was no evidence for stenosis.  ------------------------------------------------------------------- Tricuspid valve: Structurally normal valve. Doppler: Transvalvular velocity was within the normal range. There was no regurgitation.  ------------------------------------------------------------------- Pulmonary artery: The main pulmonary artery was normal-sized. Systolic pressure was within the normal range.  ------------------------------------------------------------------- Right atrium: The atrium was normal in size.  ------------------------------------------------------------------- Pericardium: There was no pericardial effusion.  ------------------------------------------------------------------- Systemic veins: Inferior vena cava: The vessel was normal in size.  ------------------------------------------------------------------- Measurements  Left ventricle Value Reference LV ID, ED, PLAX chordal 51 mm 43 - 52  LV ID, ES, PLAX chordal 32 mm 23 - 38 LV fx shortening, PLAX chordal 37 % >=29 LV PW thickness, ED 14 mm ---------- IVS/LV PW ratio, ED 1 <=1.3 Stroke volume, 2D 69 ml ---------- Stroke volume/bsa, 2D 30 ml/m^2 ---------- LV e&', lateral  6.96 cm/s ---------- LV E/e&', lateral 16.52 ---------- LV e&', medial 5.22 cm/s ---------- LV E/e&', medial 22.03 ---------- LV e&', average 6.09 cm/s ---------- LV E/e&', average 18.88 ----------  Ventricular septum Value Reference IVS thickness, ED 14 mm ----------  LVOT Value Reference LVOT ID, S 21 mm ---------- LVOT area 3.46 cm^2 ---------- LVOT peak velocity, S 75.4 cm/s ---------- LVOT mean velocity, S 54 cm/s ---------- LVOT VTI, S 20 cm ---------- LVOT peak gradient, S 2 mm Hg ----------  Aortic valve Value Reference Aortic valve peak velocity, S 469 cm/s ---------- Aortic valve mean velocity, S 355 cm/s ---------- Aortic valve VTI, S 127 cm ---------- Aortic mean gradient, S 54 mm Hg ---------- Aortic peak gradient, S 88 mm Hg ---------- VTI ratio, LVOT/AV 0.16 ---------- Aortic valve area, VTI 0.54 cm^2 ---------- Aortic valve area/bsa, VTI 0.23 cm^2/m^2 ---------- Velocity ratio, peak, LVOT/AV 0.16 ---------- Aortic valve area, peak velocity 0.56 cm^2 ---------- Aortic valve area/bsa, peak 0.24 cm^2/m^2 ---------- velocity Velocity ratio, mean, LVOT/AV 0.15 ---------- Aortic  valve area, mean velocity 0.53 cm^2 ---------- Aortic valve area/bsa, mean 0.23 cm^2/m^2 ---------- velocity  Aorta Value Reference Aortic root ID, ED 34 mm ---------- Ascending aorta ID, A-P, S 38 mm ---------- Descending aorta ID, proximal 29 mm ----------  Left atrium Value Reference LA ID, A-P, ES 42 mm ---------- LA ID/bsa, A-P 1.82 cm/m^2 <=2.2 LA volume, S 74.8 ml ---------- LA volume/bsa, S 32.4 ml/m^2 ---------- LA volume, ES, 1-p A4C 61.2 ml ---------- LA volume/bsa, ES, 1-p A4C 26.5 ml/m^2 ---------- LA volume, ES, 1-p A2C 81.6 ml ---------- LA volume/bsa, ES, 1-p A2C 35.4 ml/m^2 ----------  Mitral valve Value Reference Mitral E-wave peak velocity 115 cm/s ---------- Mitral A-wave peak velocity 75.7 cm/s ---------- Mitral deceleration time 222 ms 150 - 230 Mitral pressure half-time 65 ms ---------- Mitral peak gradient, D 5 mm Hg ---------- Mitral E/A ratio, peak 1.5 ---------- Mitral valve area, PHT, DP 3.38 cm^2 ---------- Mitral valve area/bsa, PHT, DP 1.46 cm^2/m^2 ----------  Pulmonary arteries Value Reference PA pressure, S, DP 21 mm Hg <=30  Tricuspid valve Value Reference Tricuspid regurg peak velocity 179 cm/s ---------- Tricuspid peak  RV-RA gradient 13 mm Hg ----------  Right atrium Value Reference RA ID, S-I, ES, A4C (H) 50.7 mm 34 - 49 RA area, ES, A4C 15.5 cm^2 8.3 - 19.5 RA volume, ES, A/L 38.1 ml ---------- RA volume/bsa, ES, A/L 16.5 ml/m^2 ----------  Systemic veins Value Reference Estimated CVP 8 mm Hg ----------  Right ventricle Value Reference TAPSE 21.9 mm ---------- RV pressure, S, DP 21 mm Hg <=30 RV s&', lateral, S 13.8 cm/s ----------  Legend: (L) and (H) mark values outside specified reference range.  ------------------------------------------------------------------- Prepared and Electronically Authenticated by  Belva Crome, MD 2019-09-16T12:39:55   RIGHT/LEFT HEART CATH AND CORONARY ANGIOGRAPHY  Conclusion    Critical aortic stenosis, calculated valve area 0.91 cm. Peak to peak gradient 76 mmHg, mean gradient 64 mmHg.  Intermediate stenosis mid RCA in the 60 to 75% range.  Widely patent left coronary system.  RECOMMENDATIONS:   Severe aortic stenosis documented by echo and invasive evaluation. Referred to the aortic valve clinic for consideration of low risk TAVR with hybrid RCA stent (question timing)  versus SAVR and RCA bypass.  Start high intensity statin therapy. Avoid nonsteroidal therapy. Continue aspirin. Avoids activities that cause chest discomfort.  Recommend Aspirin 81mg  daily for moderate CAD.  Indications   Severe aortic stenosis [I35.0 (ICD-10-CM)]  Coronary artery disease of native artery of transplanted heart with stable angina pectoris (HCC) [I25.758 (ICD-10-CM)]  Procedural  Details/Technique   Technical Details The right radial area was sterilely prepped and draped. Intravenous sedation with Versed and fentanyl was administered. 1% Xylocaine was infiltrated to achieve local analgesia. Using real-time vascular ultrasound, a double wall stick with an angiocath was utilized to obtain intra-arterial access. A VUS image was saved for the permanent record.The modified Seldinger technique was used to place a 53F " Slender" sheath in the right radial artery. Weight based heparin was administered. Coronary angiography was done using 5 F catheters. Right coronary angiography was performed with a JR4. Left ventricular hemodymic recordings and angiography was done using the JR 4 catheter and hand injection. Left coronary angiography was performed with a JL 3.5 cm.  Right heart catheterization was performed by exchanging a previously placed antecubital IV angio-cath for a 5 French Slender sheath. 1% Xylocaine was used to locally nesthetize the area around the IV site. The IV catheter was wired using an .018 guidewire. The modified Seldinger technique was used to place the 5 [place] sheath. Double glove technique was used to enhance sterility. After sheath insertion, right heart cath was performed using a 5 French balloon tipped catheter and fluoroscopic guidance. Pressures were recorded in each chamber and in the pulmonary capillary wedge position.. The main pulmonary artery O2 saturation was sampled.   Hemostasis was achieved using a pneumatic band.  During this procedure the patient is administered a total of Versed 1.5 mg and Fentanyl 25 mg to achieve and maintain moderate conscious sedation. The patient's heart rate, blood pressure, and oxygen saturation are monitored continuously during the procedure. The period of conscious sedation is 37 minutes, of which I was present face-to-face 100% of this time.   Estimated blood loss <50 mL.  During this procedure the patient was  administered the following to achieve and maintain moderate conscious sedation: Versed 1.5 mg, Fentanyl 25 mcg, while the patient's heart rate, blood pressure, and oxygen saturation were continuously monitored. The period of conscious sedation was 37 minutes, of which I was present face-to-face 100% of this time.  Coronary Findings   Diagnostic  Dominance: Right  Left Anterior Descending  Ost LAD to Prox LAD lesion 25% stenosed  Ost LAD to Prox LAD lesion is 25% stenosed.  Right Coronary Artery  Mid RCA lesion 65% stenosed  Mid RCA lesion is 65% stenosed.  Intervention   No interventions have been documented.  Right Heart   Right Heart Pressures Hemodynamic findings consistent with mild pulmonary hypertension. Elevated LV EDP consistent with volume overload.  Left Heart   Left Ventricle LV end diastolic pressure is moderately elevated.  Aortic Valve There is severe aortic valve stenosis. The aortic valve is calcified. There is restricted aortic valve motion.  Coronary Diagrams   Diagnostic Diagram       Implants       No implant documentation for this case.  MERGE Images   Show images for CARDIAC CATHETERIZATION   Link to Procedure Log   Procedure Log    Hemo Data    Most Recent Value  Fick Cardiac Output 6.16 L/min  Fick Cardiac Output Index 2.75 (L/min)/BSA  Aortic Mean Gradient 64.38 mmHg  Aortic Peak Gradient 76 mmHg  Aortic Valve Area 0.91  Aortic Value Area Index 0.41 cm2/BSA  RA A Wave 13 mmHg  RA V Wave 10 mmHg  RA Mean 9 mmHg  RV Systolic Pressure 35 mmHg  RV Diastolic Pressure 5 mmHg  RV EDP 12 mmHg  PA Systolic Pressure 37 mmHg  PA Diastolic Pressure 14 mmHg  PA Mean 23 mmHg  PW A Wave 18 mmHg  PW V Wave 16 mmHg  PW Mean 13 mmHg  AO Systolic Pressure 108 mmHg  AO Diastolic Pressure 55 mmHg  AO Mean 73 mmHg  LV Systolic Pressure 181 mmHg  LV Diastolic Pressure 10 mmHg  LV EDP 21 mmHg  AOp Systolic Pressure 102 mmHg  AOp Diastolic  Pressure 55 mmHg  AOp Mean Pressure 72 mmHg  LVp Systolic Pressure 178 mmHg  LVp Diastolic Pressure 11 mmHg  LVp EDP Pressure 19 mmHg  QP/QS 1  TPVR Index 8.35 HRUI  TSVR Index 26.85 HRUI  PVR SVR Ratio 0.15  TPVR/TSVR Ratio 0.31    STS Risk Calculator  Procedure: AVR + CAB CALCULATE   Risk of Mortality:  0.661% Renal Failure:  0.745% Permanent Stroke:  0.747% Prolonged Ventilation:  4.140% DSW Infection:  0.239% Reoperation:  2.486% Morbidity or Mortality:  6.765% Short Length of Stay:  59.103% Long Length of Stay:  2.876%    Cardiac TAVR CT  TECHNIQUE: The patient was scanned on a [employer]. A 120 kV retrospective scan was triggered in the descending thoracic aorta at 111 HU's. Gantry rotation speed was 250 msecs and collimation was .6 mm. No beta blockade or nitro were given. The 3D data set was reconstructed in 5% intervals of the R-R cycle. Systolic and diastolic phases were analyzed on a dedicated work station using MPR, MIP and VRT modes. The patient received 80 cc of contrast.  FINDINGS: Aortic Valve: Trileaflet, but functionally bileaflet aortic valve with co-joined left and right leaflet and severe calcifications of the leaflets. No calcifications are extending into the LVOT.  Aorta: Normal size with moderate calcifications i the aortic arch and no dissection.  Sinotubular Junction: 29 x 29 mm  Ascending Thoracic Aorta: 36 x 35 mm  Aortic Arch: 25 x 24 mm  Descending Thoracic Aorta: 27 x 26 mm  Sinus of Valsalva Measurements:  Non-coronary: 33 mm  Right -coronary: 31 mm  Left -coronary: 31 mm  Coronary Artery Height above Annulus:  Left Main: 13.5 mm  Right Coronary: 18.1 mm  Virtual Basal Annulus Measurements:  Maximum/Minimum Diameter: 30.0 x 22.1 mm  Mean Diameter: 25.7 mm  Perimeter: 82.8 mm  Area: 519 mm2  Optimum Fluoroscopic Angle for Delivery: LAO 7 CAU  10  IMPRESSION: 1. Trileaflet, but functionally bileaflet aortic valve with co-joined left and right leaflet and severe calcifications of the leaflets. No calcifications are extending into the LVOT.  2. Sufficient coronary to annulus distance.  3. Optimum Fluoroscopic Angle for Delivery: LAO 7 CAU 10  4. No thrombus in the left atrial appendage.   Electronically Signed By: Tobias Alexander On: 08/02/2018 16:29       CT ANGIOGRAPHY CHEST, ABDOMEN AND PELVIS  TECHNIQUE: Multidetector CT imaging through the chest, abdomen and pelvis was performed using the standard protocol during bolus administration of intravenous contrast. Multiplanar reconstructed images and MIPs were obtained and reviewed to evaluate the vascular anatomy.  CONTRAST: ISOVUE-370 IOPAMIDOL (ISOVUE-370) INJECTION 76%  COMPARISON: 12/22/2003  FINDINGS: CTA CHEST FINDINGS  Cardiovascular: Heart size normal. No  pericardial effusion. Dilated central pulmonary arteries. Fairly good contrast opacification of the pulmonary arterial tree; the exam was not optimized for detection of pulmonary emboli.  Scattered coronary calcifications. Heavy aortic leaflet calcifications. Adequate contrast opacification of the thoracic aorta with no evidence of dissection, aneurysm, or stenosis. There is classic 3-vessel brachiocephalic arch anatomy without proximal stenosis. Calcified atheromatous plaque in the arch.  Mediastinum/Nodes: No hilar or mediastinal adenopathy.  Lungs/Pleura: No pleural effusion. No pneumothorax. Previous right thoracotomy with surgical staple line in the posterior right lower lobe. Adjacent coarse interstitial and airspace opacities in the posterior right upper lobe. Linear scarring or subsegmental atelectasis in the posterior left lower lobe and inferior lingula. Emphysematous changes most evident in the upper lobes.  Musculoskeletal: Small focus of right lower  lobe herniation posteriorly into the intercostal space below the right eighth rib at the level of prior surgery. Spondylitic changes in the visualized lower cervical spine. Anterior vertebral endplate spurring at multiple levels in the lower thoracic spine. No fracture or worrisome bone  Review of the MIP images confirms the above findings.  CTA ABDOMEN AND PELVIS FINDINGS  VASCULAR  Aorta: Moderate partially calcified atheromatous plaque particularly in the infrarenal segment, ectatic up to 3 cm at the level of the bifurcation, with some scattered eccentric nonocclusive mural thrombus. No stenosis.  Celiac: Short-segment origin stenosis at the level of the median arcuate ligament of the diaphragm, patent distally.  SMA: Calcified nonocclusive ostial plaque. Patent distally. Replaced right hepatic arterial supply, an anatomic variant.  Renals: Single right, with scattered calcified plaque, no high-grade stenosis. Duplicated left, superior dominant, both with scattered calcified nonocclusive plaque.  IMA: Patent without evidence of aneurysm, dissection, vasculitis or significant stenosis.  Inflow: Ectatic proximal left common iliac artery up to 2 cm diameter. Origin stenosis of the left internal iliac artery with somewhat saccular 1.4 cm aneurysmal segment just beyond the origin, containing some mural thrombus. Heavy calcified plaque through the left external iliac artery with focal stenosis of in its midportion of possible hemodynamic significance.  Fusiform dilatation of the distal right common iliac artery up to 2.5 cm diameter. Short-segment origin stenosis of the internal iliac artery. Heavily calcified eccentric plaque through the right external iliac without high-grade stenosis. No significant tortuosity of the iliac arterial system.  Visualized portions of proximal outflow have scattered calcified plaque but no aneurysm or stenosis.  Veins: No obvious  venous abnormality within the limitations of this arterial phase study.  Review of the MIP images confirms the above findings.  NON-VASCULAR  Hepatobiliary: No focal liver abnormality is seen. No gallstones, gallbladder wall thickening, or biliary dilatation.  Pancreas: Unremarkable. No pancreatic ductal dilatation or surrounding inflammatory changes.  Spleen: Normal in size without focal abnormality.  Adrenals/Urinary Tract: Adrenal glands are unremarkable. Kidneys are normal, without renal calculi, focal lesion, or hydronephrosis. Bladder is incompletely distended, with eccentric mural thickening along its right anterior wall, with 1 cm nodular extension into the extraperitoneal fat.  Stomach/Bowel: Stomach and small bowel are nondilated. Appendix not discretely identified. No pericecal inflammatory/edematous change. The colon is nondilated. Probable internal hemorrhoids.  Lymphatic: No abdominal or pelvic adenopathy.  Reproductive: Moderate prostatic enlargement with coarse central calcifications.  Other: No ascites. No free air.  Musculoskeletal: Small umbilical hernia containing mesenteric fat. Spondylitic changes in the lumbar spine. No fracture or worrisome bone lesion.  Review of the MIP images confirms the above findings.  IMPRESSION: 1. Infrarenal abdominal aortic aneurysm up to 3 cm diameter, with 2.5 cm dilatation  of right common iliac artery, 2 cm left internal iliac artery. Recommend followup by [place] in 3 years. This recommendation follows ACR consensus guidelines: White Paper of the ACR Incidental Findings Committee II on Vascular Findings. J Am Coll Radiol 2013; 40:981-191 2. Short-segment stenosis in the mid left external iliac artery of possible hemodynamic significance. Consider correlation with with lower extremity segmental studies. 3. Eccentric wall thickening anteriorly in the urinary bladder with extension into the extraperitoneal  space. Consider urologic evaluation to exclude neoplasm. 4. Postop changes in the right lower lobe. Adjacent parenchymal changes may be secondary to prior radiation, correlate with clinical history.  Electronically Signed: By: Corlis Leak M.D. On: 07/31/2018 14:04    Impression:  Patient has stage D severe symptomatic aortic stenosis with single-vessel coronary artery disease. He presents with a several month history of worsening symptoms of exertional chest tightness and shortness of breath consistent with stable angina pectoris and chronic diastolic congestive heart failure, [place] Heart Association functional class II. I have personally reviewed the patient's recent transthoracic echocardiogram, diagnostic cardiac catheterization, and CT angiograms. Echocardiogram confirmed the presence of severe aortic stenosis. The aortic valve has severe thickening, calcification, and restricted leaflet mobility involving all 3 leaflets. Peak velocity across the aortic valve measured 4.7 m/s corresponding to mean transvalvular gradient greater than 50 mmHg. Left ventricular systolic function remains normal. There is left ventricular hypertrophy with significant diastolic dysfunction. Catheterization confirmed the presence of severe aortic stenosis and revealed significant single-vessel coronary artery disease with stenosis of the mid right coronary artery.   CT angiography confirms the fact that the patient has a bicuspid aortic valve (Sievers type I) and reveals normal size and anatomical characteristics of the aortic root and ascending thoracic aorta.  Risks associated with conventional surgical aortic valve replacement and coronary artery bypass grafting should be reasonably low. Given the patient's relatively young age, conventional surgery would probably be associated with best long-term outcome.    Plan:  The patientand his wife wereagain counseled at length regarding treatment  alternatives for management of severe symptomatic aortic stenosis and coronary artery disease. Alternative approaches such as conventional aortic valve replacementwith coronary artery bypass grafting, transcatheter aortic valve replacementwith or without PCI and stenting of the right coronary artery, and continued medical therapy without intervention were compared and contrasted at length. The risks associated with conventional surgical aortic valve replacement were discussed in detail, as were expectations for post-operative convalescence.Issues specific to transcatheter aortic valve replacement were discussed including questions about long term valve durability, the potential for paravalvular leak, possible increased risk of need for permanent pacemaker placement, and other technical complications related to the procedure itself. Long-term prognosis with medical therapy was discussed. This discussion was placed in the context of the patient's own specific clinical presentation and past medical history. A  The patient and his wife understand and accept all potential risks of surgery including but not limited to risk of death, stroke or other neurologic complication, myocardial infarction, congestive heart failure, respiratory failure, renal failure, bleeding requiring transfusion and/or reexploration, arrhythmia, infection or other wound complications, pneumonia, pleural and/or pericardial effusion, pulmonary embolus, aortic dissection or other major vascular complication, or delayed complications related to valve repair or replacement including but not limited to structural valve deterioration and failure, thrombosis, embolization, endocarditis, paravalvular leak, or late recurrence of symptomatic ischemic heart disease.  All of their questions have been answered.      Salvatore Decent. Cornelius Moras, MD 08/04/2018 4:36 PM

## 2018-08-05 NOTE — Progress Notes (Signed)
Anesthesia Chart Review:  Case:  [id number] Date/Time:  08/06/18 0715   Procedures:      CORONARY ARTERY BYPASS GRAFTING (CABG) (N/A Chest)     AORTIC VALVE REPLACEMENT (AVR) (N/A Chest)     TRANSESOPHAGEAL ECHOCARDIOGRAM (TEE) (N/A )   Anesthesia type:  General   Pre-op diagnosis:      AS     CAD   [facility] / [facility]   Surgeon:  Purcell Nails, MD      DISCUSSION: Patient is a 62 year old male scheduled for the above procedure. He is tentatively scheduled for CABG/AVR on 08/06/18.   History includes smoking, COPD, CAD, severe AS, HTN, dyslipidemia, dyspnea. BMI is consistent with mild obesity.  If no acute changes then I anticipate that he can proceed as planned.   VS: BP 99/65   Pulse 63   Temp 36.9 C   Resp 20   Ht 5\' 11"  (1.803 m)   Wt 100.9 kg   SpO2 96%   BMI 31.02 kg/m    PROVIDERS: Wilmer Floor., MD is PCP Belva Crome, MD is primary cardiologist   LABS: Labs reviewed: Acceptable for surgery. (all labs ordered are listed, but only abnormal results are displayed)  Labs Reviewed  SURGICAL PCR SCREEN - Abnormal; Notable for the following components:      Result Value   Staphylococcus aureus POSITIVE (*)    All other components within normal limits  COMPREHENSIVE METABOLIC PANEL - Abnormal; Notable for the following components:   CO2 20 (*)    Glucose, Bld 105 (*)    All other components within normal limits  HEMOGLOBIN A1C - Abnormal; Notable for the following components:   Hgb A1c MFr Bld 6.0 (*)    All other components within normal limits  URINALYSIS, ROUTINE W REFLEX MICROSCOPIC - Abnormal; Notable for the following components:   Color, Urine STRAW (*)    Hgb urine dipstick SMALL (*)    All other components within normal limits  APTT  BLOOD GAS, ARTERIAL  CBC  PROTIME-INR  TYPE AND SCREEN  ABO/RH    PFTs 08/04/18: PRE BD: FVC 3.75 (77%), FEV1 2.20 (61%). POST BD: FVC 4.40 (84%). FEV1 2.20 (61%). DLCO unc 23.77  (70%).   IMAGES: CXR 08/04/18: IMPRESSION: Stable bilateral chronic interstitial changes consistent chronic interstitial lung disease. No acute cardiopulmonary disease identified.  CTA chest/abd/pelvis 07/31/18 (ordered by Dr. Cornelius Moras): IMPRESSION: 1. Infrarenal abdominal aortic aneurysm up to 3 cm diameter, with 2.5 cm dilatation of right common iliac artery, 2 cm left internal iliac artery. Recommend followup by [place] in 3 years. This recommendation follows ACR consensus guidelines: White Paper of the ACR Incidental Findings Committee II on Vascular Findings. J Am Coll Radiol 2013; 91:478-295 2. Short-segment stenosis in the mid left external iliac artery of possible hemodynamic significance. Consider correlation with with lower extremity segmental studies. 3. Eccentric wall thickening anteriorly in the urinary bladder with extension into the extraperitoneal space. Consider urologic evaluation to exclude neoplasm. 4. Postop changes in the right lower lobe. Adjacent parenchymal changes may be secondary to prior radiation, correlate with clinical history.   EKG: 08/04/18: NSR, T wave abnormality, consider lateral ischemia.   CV: Carotid U/S 08/04/18: Summary: Right Carotid: Velocities in the right ICA are consistent with a 1-39% stenosis. Left Carotid: Velocities in the left ICA are consistent with a 1-39% stenosis. Vertebrals: Bilateral vertebral arteries demonstrate antegrade flow.  CT coronary 07/31/18: IMPRESSION: 1. Trileaflet, but functionally  bileaflet aortic valve with co-joined left and right leaflet and severe calcifications of the leaflets. No calcifications are extending into the LVOT. 2. Sufficient coronary to annulus distance. 3. Optimum Fluoroscopic Angle for Delivery: LAO 7 CAU 10 4. No thrombus in the left atrial appendage.  Cardiac cath 06/30/18:  Critical aortic stenosis, calculated valve area 0.91 cm. Peak to peak gradient 76 mmHg, mean gradient 64  mmHg.  Intermediate stenosis mid RCA in the 60 to 75% range.  Widely patent left coronary system. RECOMMENDATIONS:  Severe aortic stenosis documented by echo and invasive evaluation. Referred to the aortic valve clinic for consideration of low risk TAVR with hybrid RCA stent (question timing) versus SAVR and RCA bypass.  Start high intensity statin therapy. Avoid nonsteroidal therapy. Continue aspirin. Avoids activities that cause chest discomfort. Recommend Aspirin 81mg  daily for moderate CAD.  Echo 06/29/18: Study Conclusions - Left ventricle: The cavity size was normal. Wall thickness was increased in a pattern of moderate LVH. Systolic function was normal. The estimated ejection fraction was in the range of 60% to 65%. Wall motion was normal; there were no regional wall motion abnormalities. - Aortic valve: Doppler: There was critical stenosis. There was no regurgitation. VTI ratio of LVOT to aortic valve: 0.16. Valve area (VTI): 0.54 cm^2. Indexed valve area (VTI): 0.23 cm^2/m^2. Peak velocity ratio of LVOT to aortic valve: 0.16. Valve area (Vmax): 0.56 cm^2. Indexed valve area (Vmax): 0.24 cm^2/m^2. Mean velocity ratio of LVOT to aortic valve: 0.15. Valve area (Vmean): 0.53 cm^2. Indexed valve area (Vmean): 0.23 cm^2/m^2. Mean gradient (S): 54 mm Hg. Peak gradient (S): 88 mm Hg. Impressions: - 1. Critical aortic stenosis. Please see detailed measurements in the body of the report. 2. Concentric left ventricular hypertrophy with preserved left ventricular systolic function.   Past Medical History:  Diagnosis Date  . Anal fissure   . Aortic stenosis   . Bilateral carotid bruits   . COPD (chronic obstructive pulmonary disease) (HCC)   . Coronary artery disease involving native coronary artery of native heart with angina pectoris (HCC)   . Dyslipidemia   . Dyspnea    when I first get started moving  around -  then it goes away  . ED  (erectile dysfunction)   . Heart murmur   . Hemorrhoids   . Hypertension   . Hypogonadism in male   . Leg cramps   . Pain of left shoulder region   . Petechiae   . Pneumonia 2016 ish  . Smoking   . Vitamin D deficiency     Past Surgical History:  Procedure Laterality Date  . APPENDECTOMY    . COLONOSCOPY W/ POLYPECTOMY     x 2  . Lung Biospy Right    lower lung  . RIGHT/LEFT HEART CATH AND CORONARY ANGIOGRAPHY N/A 06/30/2018   Procedure: RIGHT/LEFT HEART CATH AND CORONARY ANGIOGRAPHY;  Surgeon: Lyn Records, MD;  Location: [facility];  Service: Cardiovascular;  Laterality: N/A;  . TOOTH EXTRACTION N/A 07/30/2018   Procedure: Extraction of tooth #5 w/ Alveoloplasty and Gross debridement of remaining dentition.;  Surgeon: Charlynne Pander, DDS;  Location: [facility] OR;  Service: Oral Surgery;  Laterality: N/A;    MEDICATIONS: . albuterol (PROVENTIL HFA;VENTOLIN HFA) 108 (90 Base) MCG/ACT inhaler  . aspirin EC 81 MG tablet  . atorvastatin (LIPITOR) 40 MG tablet  . HYDROcodone-acetaminophen (NORCO) 5-325 MG tablet  . lisinopril-hydrochlorothiazide (PRINZIDE,ZESTORETIC) 20-12.5 MG tablet  . valACYclovir (VALTREX) 500 MG tablet   No  current facility-administered medications for this encounter.    Melene Muller ON 08/06/2018] cefUROXime (ZINACEF) 1.5 g in sodium chloride 0.9 % 100 mL IVPB  . [START ON 08/06/2018] cefUROXime (ZINACEF) 750 mg in sodium chloride 0.9 % 100 mL IVPB  . [START ON 08/06/2018] dexmedetomidine (PRECEDEX) 400 MCG/100ML (4 mcg/mL) infusion  . [START ON 08/06/2018] DOPamine (INTROPIN) 800 mg in dextrose 5 % 250 mL (3.2 mg/mL) infusion  . [START ON 08/06/2018] EPINEPHrine (ADRENALIN) 4 mg in dextrose 5 % 250 mL (0.016 mg/mL) infusion  . [START ON 08/06/2018] heparin 2,500 Units, papaverine 30 mg in electrolyte-148 (PLASMALYTE-148) 500 mL irrigation  . [START ON 08/06/2018] heparin 30,000 units/NS 1000 mL solution for CELLSAVER  . [START ON 08/06/2018] insulin  regular, human (MYXREDLIN) 100 units/ 100 mL infusion  . [START ON 08/06/2018] [place] Blood Cardioplegia (KBC) lidocaine 2% Syringe (13mL)  . [START ON 08/06/2018] [place] Blood Cardioplegia (KBC) lidocaine 2% Syringe (13mL)  . [START ON 08/06/2018] [place] Blood Cardioplegia (KBC) mannitol 20% Syringe (32mL)  . [START ON 08/06/2018] [place] Blood Cardioplegia (KBC) mannitol 20% Syringe (32mL)  . [START ON 08/06/2018] magnesium sulfate (IV Push/IM) injection 40 mEq  . [START ON 08/06/2018] milrinone (PRIMACOR) 20 MG/100 ML (0.2 mg/mL) infusion  . [START ON 08/06/2018] nitroGLYCERIN 50 mg in dextrose 5 % 250 mL (0.2 mg/mL) infusion  . [START ON 08/06/2018] norepinephrine (LEVOPHED) 4mg  in D5W premix infusion  . [START ON 08/06/2018] phenylephrine (NEOSYNEPHRINE) 20-0.9 MG/250ML-% infusion  . [START ON 08/06/2018] potassium chloride injection 80 mEq  . [START ON 08/06/2018] tranexamic acid (CYKLOKAPRON) 2,500 mg in sodium chloride 0.9 % 250 mL (10 mg/mL) infusion  . [START ON 08/06/2018] tranexamic acid (CYKLOKAPRON) bolus via infusion - over 30 minutes 1,510.5 mg  . [START ON 08/06/2018] tranexamic acid (CYKLOKAPRON) pump prime solution 201 mg  . [START ON 08/06/2018] vancomycin (VANCOCIN) 1,000 mg in sodium chloride 0.9 % 1,000 mL irrigation  . [START ON 08/06/2018] vancomycin (VANCOCIN) 1,500 mg in sodium chloride 0.9 % 250 mL IVPB    Velna Ochs [facility] [place] [facility]/Anesthesiology Phone [phone] 08/05/2018 9:19 AM

## 2018-08-05 NOTE — Anesthesia Preprocedure Evaluation (Addendum)
Anesthesia Evaluation  Patient identified by MRN, date of birth, ID band Patient awake    Reviewed: Allergy & Precautions, NPO status , Patient's Chart, lab work & pertinent test results  Airway Mallampati: II  TM Distance: >3 FB Neck ROM: Full    Dental  (+) Teeth Intact, Dental Advisory Given   Pulmonary shortness of breath, COPD,  COPD inhaler, Current Smoker,    Pulmonary exam normal breath sounds clear to auscultation       Cardiovascular hypertension, Pt. on medications + angina + CAD  + Valvular Problems/Murmurs AS  Rhythm:Regular Rate:Normal + Systolic murmurs Echo 06/29/18: Study Conclusions  - Left ventricle: The cavity size was normal. Wall thickness was increased in a pattern of moderate LVH. Systolic function was normal. The estimated ejection fraction was in the range of 60% to 65%. Wall motion was normal; there were no regional wall motion abnormalities. - Aortic valve: There was critical stenosis. Valve area (VTI): 0.54 cm^2. Valve area (Vmax): 0.56 cm^2. Valve area (Vmean): 0.53 cm^2. -Mean gradient (S): 54 mm Hg. Peak gradient (S): 88 mm Hg.   Neuro/Psych negative neurological ROS  negative psych ROS   GI/Hepatic negative GI ROS, Neg liver ROS,   Endo/Other  Obesity   Renal/GU negative Renal ROS     Musculoskeletal negative musculoskeletal ROS (+)   Abdominal   Peds  Hematology negative hematology ROS (+)   Anesthesia Other Findings Day of surgery medications reviewed with the patient.  Reproductive/Obstetrics                            Anesthesia Physical Anesthesia Plan  ASA: IV  Anesthesia Plan: General   Post-op Pain Management:    Induction: Intravenous  PONV Risk Score and Plan: 1 and Propofol infusion, Midazolam and Treatment may vary due to age or medical condition  Airway Management Planned: Oral ETT  Additional Equipment: Arterial line, CVP, TEE,  Ultrasound Guidance Line Placement and PA Cath  Intra-op Plan:   Post-operative Plan: Post-operative intubation/ventilation  Informed Consent: I have reviewed the patients History and Physical, chart, labs and discussed the procedure including the risks, benefits and alternatives for the proposed anesthesia with the patient or authorized representative who has indicated his/her understanding and acceptance.   Dental advisory given  Plan Discussed with: CRNA  Anesthesia Plan Comments:        Anesthesia Quick Evaluation

## 2018-08-06 ENCOUNTER — Inpatient Hospital Stay (HOSPITAL_COMMUNITY): Payer: Commercial Managed Care - PPO | Admitting: Vascular Surgery

## 2018-08-06 ENCOUNTER — Encounter (HOSPITAL_COMMUNITY): Payer: Self-pay | Admitting: *Deleted

## 2018-08-06 ENCOUNTER — Inpatient Hospital Stay (HOSPITAL_COMMUNITY): Payer: Commercial Managed Care - PPO

## 2018-08-06 ENCOUNTER — Inpatient Hospital Stay (HOSPITAL_COMMUNITY)
Admission: RE | Admit: 2018-08-06 | Discharge: 2018-08-11 | DRG: 220 | Disposition: A | Discharge status: Home / Self Care | Payer: Commercial Managed Care - PPO | Source: Ambulatory Visit | Attending: Thoracic Surgery (Cardiothoracic Vascular Surgery) | Admitting: Thoracic Surgery (Cardiothoracic Vascular Surgery)

## 2018-08-06 ENCOUNTER — Encounter (HOSPITAL_COMMUNITY)
Admission: RE | Disposition: A | Discharge status: Home / Self Care | Payer: Self-pay | Source: Ambulatory Visit | Attending: Thoracic Surgery (Cardiothoracic Vascular Surgery)

## 2018-08-06 ENCOUNTER — Inpatient Hospital Stay (HOSPITAL_COMMUNITY): Payer: Commercial Managed Care - PPO | Admitting: Registered Nurse

## 2018-08-06 ENCOUNTER — Other Ambulatory Visit (HOSPITAL_COMMUNITY): Payer: Self-pay

## 2018-08-06 DIAGNOSIS — Z79899 Other long term (current) drug therapy: Secondary | ICD-10-CM

## 2018-08-06 DIAGNOSIS — Z951 Presence of aortocoronary bypass graft: Secondary | ICD-10-CM

## 2018-08-06 DIAGNOSIS — D62 Acute posthemorrhagic anemia: Secondary | ICD-10-CM | POA: Diagnosis not present

## 2018-08-06 DIAGNOSIS — D6959 Other secondary thrombocytopenia: Secondary | ICD-10-CM | POA: Diagnosis not present

## 2018-08-06 DIAGNOSIS — E785 Hyperlipidemia, unspecified: Secondary | ICD-10-CM | POA: Diagnosis present

## 2018-08-06 DIAGNOSIS — Z953 Presence of xenogenic heart valve: Secondary | ICD-10-CM

## 2018-08-06 DIAGNOSIS — I110 Hypertensive heart disease with heart failure: Secondary | ICD-10-CM | POA: Diagnosis present

## 2018-08-06 DIAGNOSIS — I447 Left bundle-branch block, unspecified: Secondary | ICD-10-CM | POA: Diagnosis not present

## 2018-08-06 DIAGNOSIS — I350 Nonrheumatic aortic (valve) stenosis: Secondary | ICD-10-CM | POA: Diagnosis present

## 2018-08-06 DIAGNOSIS — F17210 Nicotine dependence, cigarettes, uncomplicated: Secondary | ICD-10-CM | POA: Diagnosis present

## 2018-08-06 DIAGNOSIS — Z7982 Long term (current) use of aspirin: Secondary | ICD-10-CM

## 2018-08-06 DIAGNOSIS — Z7951 Long term (current) use of inhaled steroids: Secondary | ICD-10-CM | POA: Diagnosis not present

## 2018-08-06 DIAGNOSIS — J449 Chronic obstructive pulmonary disease, unspecified: Secondary | ICD-10-CM | POA: Diagnosis present

## 2018-08-06 DIAGNOSIS — K429 Umbilical hernia without obstruction or gangrene: Secondary | ICD-10-CM | POA: Diagnosis present

## 2018-08-06 DIAGNOSIS — Z6831 Body mass index (BMI) 31.0-31.9, adult: Secondary | ICD-10-CM | POA: Diagnosis not present

## 2018-08-06 DIAGNOSIS — I082 Rheumatic disorders of both aortic and tricuspid valves: Secondary | ICD-10-CM | POA: Diagnosis not present

## 2018-08-06 DIAGNOSIS — I5032 Chronic diastolic (congestive) heart failure: Secondary | ICD-10-CM | POA: Diagnosis present

## 2018-08-06 DIAGNOSIS — J9811 Atelectasis: Secondary | ICD-10-CM | POA: Diagnosis not present

## 2018-08-06 DIAGNOSIS — I25119 Atherosclerotic heart disease of native coronary artery with unspecified angina pectoris: Secondary | ICD-10-CM | POA: Diagnosis present

## 2018-08-06 DIAGNOSIS — Z952 Presence of prosthetic heart valve: Secondary | ICD-10-CM

## 2018-08-06 DIAGNOSIS — I2510 Atherosclerotic heart disease of native coronary artery without angina pectoris: Secondary | ICD-10-CM | POA: Diagnosis present

## 2018-08-06 DIAGNOSIS — I10 Essential (primary) hypertension: Secondary | ICD-10-CM | POA: Diagnosis present

## 2018-08-06 DIAGNOSIS — I480 Paroxysmal atrial fibrillation: Secondary | ICD-10-CM | POA: Diagnosis present

## 2018-08-06 DIAGNOSIS — Z48812 Encounter for surgical aftercare following surgery on the circulatory system: Secondary | ICD-10-CM | POA: Diagnosis not present

## 2018-08-06 DIAGNOSIS — E669 Obesity, unspecified: Secondary | ICD-10-CM | POA: Diagnosis present

## 2018-08-06 DIAGNOSIS — I358 Other nonrheumatic aortic valve disorders: Secondary | ICD-10-CM | POA: Diagnosis not present

## 2018-08-06 HISTORY — DX: Presence of aortocoronary bypass graft: Z95.1

## 2018-08-06 HISTORY — DX: Presence of prosthetic heart valve: Z95.2

## 2018-08-06 HISTORY — PX: CORONARY ARTERY BYPASS GRAFT: SHX141

## 2018-08-06 HISTORY — DX: Presence of xenogenic heart valve: Z95.3

## 2018-08-06 HISTORY — PX: TEE WITHOUT CARDIOVERSION: SHX5443

## 2018-08-06 HISTORY — DX: Left bundle-branch block, unspecified: I44.7

## 2018-08-06 HISTORY — PX: AORTIC VALVE REPLACEMENT: SHX41

## 2018-08-06 LAB — CREATININE, SERUM
Creatinine, Ser: 0.72 mg/dL (ref 0.61–1.24)
GFR calc Af Amer: >60 mL/min (ref >60)
GFR calc non Af Amer: >60 mL/min (ref >60)

## 2018-08-06 LAB — ECHO TEE
AV Area VTI: 0.65 cm2
AV Mean grad: 43 mmHg
AV Peak grad: 83 mmHg

## 2018-08-06 LAB — POCT I-STAT 3, ART BLOOD GAS (G3+)
ACID-BASE DEFICIT: 2 mmol/L (ref 0.0–2.0)
Acid-base deficit: 5 mmol/L — ABNORMAL HIGH (ref 0.0–2.0)
Acid-base deficit: 5 mmol/L — ABNORMAL HIGH (ref 0.0–2.0)
Acid-base deficit: 4 mmol/L — ABNORMAL HIGH (ref 0.0–2.0)
Acid-base deficit: 5 mmol/L — ABNORMAL HIGH (ref 0.0–2.0)
BICARBONATE: 21.1 mmol/L (ref 20.0–28.0)
Bicarbonate: 24.3 mmol/L (ref 20.0–28.0)
Bicarbonate: 23.8 mmol/L (ref 20.0–28.0)
Bicarbonate: 21.9 mmol/L (ref 20.0–28.0)
Bicarbonate: 21.6 mmol/L (ref 20.0–28.0)
O2 SAT: 93 %
O2 SAT: 96 %
O2 SAT: 92 %
O2 Saturation: 100 %
O2 Saturation: 90 %
PCO2 ART: 47.4 mmHg (ref 32.0–48.0)
PCO2 ART: 60.1 mmHg — AB (ref 32.0–48.0)
PCO2 ART: 40.1 mmHg (ref 32.0–48.0)
PH ART: 7.204 — AB (ref 7.350–7.450)
PH ART: 7.336 — AB (ref 7.350–7.450)
PH ART: 7.342 — AB (ref 7.350–7.450)
PH ART: 7.299 — AB (ref 7.350–7.450)
PO2 ART: 336 mmHg — AB (ref 83.0–108.0)
PO2 ART: 86 mmHg (ref 83.0–108.0)
Patient temperature: 36.5
Patient temperature: 36
TCO2: 26 mmol/L (ref 22–32)
TCO2: 26 mmol/L (ref 22–32)
TCO2: 22 mmol/L (ref 22–32)
TCO2: 23 mmol/L (ref 22–32)
TCO2: 23 mmol/L (ref 22–32)
pCO2 arterial: 39.2 mmHg (ref 32.0–48.0)
pCO2 arterial: 43.5 mmHg (ref 32.0–48.0)
pH, Arterial: 7.317 — ABNORMAL LOW (ref 7.350–7.450)
pO2, Arterial: 79 mmHg — ABNORMAL LOW (ref 83.0–108.0)
pO2, Arterial: 60 mmHg — ABNORMAL LOW (ref 83.0–108.0)
pO2, Arterial: 68 mmHg — ABNORMAL LOW (ref 83.0–108.0)

## 2018-08-06 LAB — POCT I-STAT, CHEM 8
BUN: 20 mg/dL (ref 8–23)
BUN: 19 mg/dL (ref 8–23)
BUN: 19 mg/dL (ref 8–23)
BUN: 20 mg/dL (ref 8–23)
BUN: 20 mg/dL (ref 8–23)
BUN: 22 mg/dL (ref 8–23)
CALCIUM ION: 1.22 mmol/L (ref 1.15–1.40)
CALCIUM ION: 1.06 mmol/L — AB (ref 1.15–1.40)
CALCIUM ION: 1.09 mmol/L — AB (ref 1.15–1.40)
CALCIUM ION: 1.13 mmol/L — AB (ref 1.15–1.40)
CHLORIDE: 104 mmol/L (ref 98–111)
CHLORIDE: 103 mmol/L (ref 98–111)
CHLORIDE: 107 mmol/L (ref 98–111)
CREATININE: 0.7 mg/dL (ref 0.61–1.24)
CREATININE: 0.7 mg/dL (ref 0.61–1.24)
CREATININE: 0.7 mg/dL (ref 0.61–1.24)
Calcium, Ion: 1.19 mmol/L (ref 1.15–1.40)
Calcium, Ion: 1.07 mmol/L — ABNORMAL LOW (ref 1.15–1.40)
Chloride: 104 mmol/L (ref 98–111)
Chloride: 106 mmol/L (ref 98–111)
Chloride: 110 mmol/L (ref 98–111)
Creatinine, Ser: 0.6 mg/dL — ABNORMAL LOW (ref 0.61–1.24)
Creatinine, Ser: 0.7 mg/dL (ref 0.61–1.24)
Creatinine, Ser: 0.7 mg/dL (ref 0.61–1.24)
GLUCOSE: 166 mg/dL — AB (ref 70–99)
GLUCOSE: 134 mg/dL — AB (ref 70–99)
Glucose, Bld: 118 mg/dL — ABNORMAL HIGH (ref 70–99)
Glucose, Bld: 114 mg/dL — ABNORMAL HIGH (ref 70–99)
Glucose, Bld: 121 mg/dL — ABNORMAL HIGH (ref 70–99)
Glucose, Bld: 163 mg/dL — ABNORMAL HIGH (ref 70–99)
HCT: 38 % — ABNORMAL LOW (ref 39.0–52.0)
HCT: 32 % — ABNORMAL LOW (ref 39.0–52.0)
HCT: 33 % — ABNORMAL LOW (ref 39.0–52.0)
HEMATOCRIT: 29 % — AB (ref 39.0–52.0)
HEMATOCRIT: 35 % — AB (ref 39.0–52.0)
HEMATOCRIT: 28 % — AB (ref 39.0–52.0)
HEMOGLOBIN: 11.9 g/dL — AB (ref 13.0–17.0)
HEMOGLOBIN: 11.2 g/dL — AB (ref 13.0–17.0)
Hemoglobin: 12.9 g/dL — ABNORMAL LOW (ref 13.0–17.0)
Hemoglobin: 9.9 g/dL — ABNORMAL LOW (ref 13.0–17.0)
Hemoglobin: 9.5 g/dL — ABNORMAL LOW (ref 13.0–17.0)
Hemoglobin: 10.9 g/dL — ABNORMAL LOW (ref 13.0–17.0)
POTASSIUM: 5.4 mmol/L — AB (ref 3.5–5.1)
POTASSIUM: 4.5 mmol/L (ref 3.5–5.1)
Potassium: 3.7 mmol/L (ref 3.5–5.1)
Potassium: 4 mmol/L (ref 3.5–5.1)
Potassium: 4.2 mmol/L (ref 3.5–5.1)
Potassium: 4.5 mmol/L (ref 3.5–5.1)
SODIUM: 141 mmol/L (ref 135–145)
SODIUM: 142 mmol/L (ref 135–145)
SODIUM: 141 mmol/L (ref 135–145)
Sodium: 142 mmol/L (ref 135–145)
Sodium: 139 mmol/L (ref 135–145)
Sodium: 140 mmol/L (ref 135–145)
TCO2: 29 mmol/L (ref 22–32)
TCO2: 28 mmol/L (ref 22–32)
TCO2: 28 mmol/L (ref 22–32)
TCO2: 26 mmol/L (ref 22–32)
TCO2: 26 mmol/L (ref 22–32)
TCO2: 23 mmol/L (ref 22–32)

## 2018-08-06 LAB — GLUCOSE, CAPILLARY
GLUCOSE-CAPILLARY: 116 mg/dL — AB (ref 70–99)
GLUCOSE-CAPILLARY: 129 mg/dL — AB (ref 70–99)
Glucose-Capillary: 98 mg/dL (ref 70–99)
Glucose-Capillary: 125 mg/dL — ABNORMAL HIGH (ref 70–99)
Glucose-Capillary: 132 mg/dL — ABNORMAL HIGH (ref 70–99)
Glucose-Capillary: 125 mg/dL — ABNORMAL HIGH (ref 70–99)

## 2018-08-06 LAB — CBC
HCT: 39.8 % (ref 39.0–52.0)
HCT: 39.1 % (ref 39.0–52.0)
HEMOGLOBIN: 12.2 g/dL — AB (ref 13.0–17.0)
Hemoglobin: 12.1 g/dL — ABNORMAL LOW (ref 13.0–17.0)
MCH: 30.7 pg (ref 26.0–34.0)
MCH: 30.6 pg (ref 26.0–34.0)
MCHC: 30.7 g/dL (ref 30.0–36.0)
MCHC: 30.9 g/dL (ref 30.0–36.0)
MCV: 100.3 fL — ABNORMAL HIGH (ref 80.0–100.0)
MCV: 99 fL (ref 80.0–100.0)
PLATELETS: 126 10*3/uL — AB (ref 150–400)
PLATELETS: 143 10*3/uL — AB (ref 150–400)
RBC: 3.97 MIL/uL — AB (ref 4.22–5.81)
RBC: 3.95 MIL/uL — ABNORMAL LOW (ref 4.22–5.81)
RDW: 13.2 % (ref 11.5–15.5)
RDW: 13.1 % (ref 11.5–15.5)
WBC: 15 10*3/uL — ABNORMAL HIGH (ref 4.0–10.5)
WBC: 16.4 10*3/uL — ABNORMAL HIGH (ref 4.0–10.5)
nRBC: 0 % (ref 0.0–0.2)
nRBC: 0 % (ref 0.0–0.2)

## 2018-08-06 LAB — HEMOGLOBIN AND HEMATOCRIT, BLOOD
HCT: 31.2 % — ABNORMAL LOW (ref 39.0–52.0)
HEMOGLOBIN: 10 g/dL — AB (ref 13.0–17.0)

## 2018-08-06 LAB — MAGNESIUM: MAGNESIUM: 3.5 mg/dL — AB (ref 1.7–2.4)

## 2018-08-06 LAB — PROTIME-INR
INR: 1.39
PROTHROMBIN TIME: 16.9 s — AB (ref 11.4–15.2)

## 2018-08-06 LAB — POCT I-STAT 4, (NA,K, GLUC, HGB,HCT)
Glucose, Bld: 118 mg/dL — ABNORMAL HIGH (ref 70–99)
HEMATOCRIT: 34 % — AB (ref 39.0–52.0)
Hemoglobin: 11.6 g/dL — ABNORMAL LOW (ref 13.0–17.0)
Potassium: 4.1 mmol/L (ref 3.5–5.1)
Sodium: 142 mmol/L (ref 135–145)

## 2018-08-06 LAB — PLATELET COUNT: Platelets: 144 10*3/uL — ABNORMAL LOW (ref 150–400)

## 2018-08-06 LAB — APTT: APTT: 34 s (ref 24–36)

## 2018-08-06 SURGERY — CORONARY ARTERY BYPASS GRAFTING (CABG)
Anesthesia: General | Site: Chest

## 2018-08-06 MED ORDER — SODIUM CHLORIDE 0.9% FLUSH
3.0000 mL | Freq: Two times a day (BID) | INTRAVENOUS | Status: DC
  Administered 2018-08-07 – 2018-08-10 (×5): 3 mL via INTRAVENOUS

## 2018-08-06 MED ORDER — CHLORHEXIDINE GLUCONATE 4 % EX LIQD: 30.0000 mL | CUTANEOUS | Status: DC

## 2018-08-06 MED ORDER — BISACODYL 5 MG PO TBEC
10.0000 mg | DELAYED_RELEASE_TABLET | Freq: Every day | ORAL | Status: DC
  Administered 2018-08-07 – 2018-08-11 (×5): 10 mg via ORAL
  Filled 2018-08-06 (×5): qty 2

## 2018-08-06 MED ORDER — ALBUMIN HUMAN 5 % IV SOLN
INTRAVENOUS | Status: DC | PRN
  Administered 2018-08-06: 12:00:00 via INTRAVENOUS

## 2018-08-06 MED ORDER — CHLORHEXIDINE GLUCONATE 0.12 % MT SOLN
15.0000 mL | OROMUCOSAL | Status: AC
  Administered 2018-08-06: 15 mL via OROMUCOSAL

## 2018-08-06 MED ORDER — SODIUM CHLORIDE 0.9 % IV SOLN: INTRAVENOUS | Status: DC

## 2018-08-06 MED ORDER — SODIUM CHLORIDE 0.9 % IV SOLN
250.0000 mL | INTRAVENOUS | Status: DC
  Administered 2018-08-08: 10 mL via INTRAVENOUS

## 2018-08-06 MED ORDER — METOPROLOL TARTRATE 5 MG/5ML IV SOLN
2.5000 mg | INTRAVENOUS | Status: DC | PRN
  Administered 2018-08-07 (×3): 2.5 mg via INTRAVENOUS
  Filled 2018-08-06 (×2): qty 5

## 2018-08-06 MED ORDER — LACTATED RINGERS IV SOLN
INTRAVENOUS | Status: DC | PRN
  Administered 2018-08-06: 07:00:00 via INTRAVENOUS

## 2018-08-06 MED ORDER — POTASSIUM CHLORIDE 10 MEQ/50ML IV SOLN: 10.0000 meq | INTRAVENOUS | Status: AC

## 2018-08-06 MED ORDER — BISACODYL 10 MG RE SUPP: 10.0000 mg | Freq: Every day | RECTAL | Status: DC

## 2018-08-06 MED ORDER — MORPHINE SULFATE (PF) 2 MG/ML IV SOLN
1.0000 mg | INTRAVENOUS | Status: DC | PRN
  Administered 2018-08-06 – 2018-08-07 (×5): 2 mg via INTRAVENOUS
  Filled 2018-08-06 (×6): qty 1

## 2018-08-06 MED ORDER — MIDAZOLAM HCL 2 MG/2ML IJ SOLN
INTRAMUSCULAR | Status: AC
  Filled 2018-08-06: qty 10

## 2018-08-06 MED ORDER — ETOMIDATE 2 MG/ML IV SOLN
INTRAVENOUS | Status: DC | PRN
  Administered 2018-08-06: 20 mg via INTRAVENOUS

## 2018-08-06 MED ORDER — SODIUM CHLORIDE 0.9 % IV SOLN
INTRAVENOUS | Status: DC | PRN
  Administered 2018-08-06: 40 ug/min via INTRAVENOUS

## 2018-08-06 MED ORDER — MAGNESIUM SULFATE 4 GM/100ML IV SOLN
4.0000 g | Freq: Once | INTRAVENOUS | Status: AC
  Administered 2018-08-06: 4 g via INTRAVENOUS
  Filled 2018-08-06: qty 100

## 2018-08-06 MED ORDER — ACETAMINOPHEN 160 MG/5ML PO SOLN: 650.0000 mg | Freq: Once | ORAL | Status: AC

## 2018-08-06 MED ORDER — DEXMEDETOMIDINE HCL IN NACL 400 MCG/100ML IV SOLN
0.0000 ug/kg/h | INTRAVENOUS | Status: DC
  Administered 2018-08-06: 0.7 ug/kg/h via INTRAVENOUS
  Administered 2018-08-06 (×2): 0.5 ug/kg/h via INTRAVENOUS
  Filled 2018-08-06: qty 100

## 2018-08-06 MED ORDER — ACETAMINOPHEN 500 MG PO TABS
1000.0000 mg | ORAL_TABLET | Freq: Four times a day (QID) | ORAL | Status: DC
  Administered 2018-08-06 – 2018-08-11 (×16): 1000 mg via ORAL
  Filled 2018-08-06 (×18): qty 2

## 2018-08-06 MED ORDER — FENTANYL CITRATE (PF) 250 MCG/5ML IJ SOLN
INTRAMUSCULAR | Status: AC
  Filled 2018-08-06: qty 25

## 2018-08-06 MED ORDER — LACTATED RINGERS IV SOLN
INTRAVENOUS | Status: DC | PRN
  Administered 2018-08-06 (×2): via INTRAVENOUS

## 2018-08-06 MED ORDER — ASPIRIN 81 MG PO CHEW: 324.0000 mg | CHEWABLE_TABLET | Freq: Every day | ORAL | Status: DC

## 2018-08-06 MED ORDER — TRAMADOL HCL 50 MG PO TABS
50.0000 mg | ORAL_TABLET | ORAL | Status: DC | PRN
  Administered 2018-08-08 – 2018-08-09 (×2): 100 mg via ORAL
  Administered 2018-08-09: 50 mg via ORAL
  Filled 2018-08-06 (×2): qty 2
  Filled 2018-08-06: qty 1

## 2018-08-06 MED ORDER — INSULIN REGULAR BOLUS VIA INFUSION
0.0000 [IU] | Freq: Three times a day (TID) | INTRAVENOUS | Status: DC
  Filled 2018-08-06: qty 10

## 2018-08-06 MED ORDER — MIDAZOLAM HCL 2 MG/2ML IJ SOLN: 2.0000 mg | INTRAMUSCULAR | Status: DC | PRN

## 2018-08-06 MED ORDER — LACTATED RINGERS IV SOLN: INTRAVENOUS | Status: DC

## 2018-08-06 MED ORDER — 0.9 % SODIUM CHLORIDE (POUR BTL) OPTIME
TOPICAL | Status: DC | PRN
  Administered 2018-08-06: 6000 mL

## 2018-08-06 MED ORDER — ONDANSETRON HCL 4 MG/2ML IJ SOLN
INTRAMUSCULAR | Status: DC | PRN
  Administered 2018-08-06: 4 mg via INTRAVENOUS

## 2018-08-06 MED ORDER — VANCOMYCIN HCL IN DEXTROSE 1-5 GM/200ML-% IV SOLN
1000.0000 mg | Freq: Once | INTRAVENOUS | Status: AC
  Administered 2018-08-06: 1000 mg via INTRAVENOUS

## 2018-08-06 MED ORDER — LACTATED RINGERS IV SOLN
INTRAVENOUS | Status: DC | PRN
  Administered 2018-08-06: 08:00:00 via INTRAVENOUS

## 2018-08-06 MED ORDER — SODIUM CHLORIDE 0.9 % IV SOLN
INTRAVENOUS | Status: DC | PRN
  Administered 2018-08-06: 750 mg via INTRAVENOUS

## 2018-08-06 MED ORDER — LIDOCAINE 2% (20 MG/ML) 5 ML SYRINGE
INTRAMUSCULAR | Status: AC
  Filled 2018-08-06: qty 5

## 2018-08-06 MED ORDER — MUPIROCIN 2 % EX OINT
1.0000 "application " | TOPICAL_OINTMENT | Freq: Two times a day (BID) | CUTANEOUS | Status: AC
  Administered 2018-08-06 – 2018-08-10 (×10): 1 via NASAL
  Filled 2018-08-06 (×2): qty 22

## 2018-08-06 MED ORDER — ACETAMINOPHEN 650 MG RE SUPP
650.0000 mg | Freq: Once | RECTAL | Status: AC
  Administered 2018-08-06: 650 mg via RECTAL

## 2018-08-06 MED ORDER — ETOMIDATE 2 MG/ML IV SOLN
INTRAVENOUS | Status: AC
  Filled 2018-08-06: qty 10

## 2018-08-06 MED ORDER — PROPOFOL 10 MG/ML IV BOLUS
INTRAVENOUS | Status: AC
  Filled 2018-08-06: qty 20

## 2018-08-06 MED ORDER — LACTATED RINGERS IV SOLN: 500.0000 mL | Freq: Once | INTRAVENOUS | Status: DC | PRN

## 2018-08-06 MED ORDER — DOCUSATE SODIUM 100 MG PO CAPS
200.0000 mg | ORAL_CAPSULE | Freq: Every day | ORAL | Status: DC
  Administered 2018-08-07 – 2018-08-11 (×5): 200 mg via ORAL
  Filled 2018-08-06 (×5): qty 2

## 2018-08-06 MED ORDER — ONDANSETRON HCL 4 MG/2ML IJ SOLN
INTRAMUSCULAR | Status: AC
  Filled 2018-08-06: qty 2

## 2018-08-06 MED ORDER — ASPIRIN EC 325 MG PO TBEC
325.0000 mg | DELAYED_RELEASE_TABLET | Freq: Every day | ORAL | Status: DC
  Administered 2018-08-07 – 2018-08-11 (×5): 325 mg via ORAL
  Filled 2018-08-06 (×5): qty 1

## 2018-08-06 MED ORDER — FAMOTIDINE IN NACL 20-0.9 MG/50ML-% IV SOLN
20.0000 mg | Freq: Two times a day (BID) | INTRAVENOUS | Status: DC
  Administered 2018-08-06: 20 mg via INTRAVENOUS

## 2018-08-06 MED ORDER — PROTAMINE SULFATE 10 MG/ML IV SOLN
INTRAVENOUS | Status: DC | PRN
  Administered 2018-08-06: 290 mg via INTRAVENOUS
  Administered 2018-08-06: 10 mg via INTRAVENOUS

## 2018-08-06 MED ORDER — HEPARIN SODIUM (PORCINE) 1000 UNIT/ML IJ SOLN
INTRAMUSCULAR | Status: DC | PRN
  Administered 2018-08-06: 32000 [IU] via INTRAVENOUS

## 2018-08-06 MED ORDER — MIDAZOLAM HCL 5 MG/5ML IJ SOLN
INTRAMUSCULAR | Status: DC | PRN
  Administered 2018-08-06: 6 mg via INTRAVENOUS
  Administered 2018-08-06 (×2): 2 mg via INTRAVENOUS

## 2018-08-06 MED ORDER — OXYCODONE HCL 5 MG PO TABS
5.0000 mg | ORAL_TABLET | ORAL | Status: DC | PRN
  Administered 2018-08-06: 5 mg via ORAL
  Administered 2018-08-06 – 2018-08-07 (×5): 10 mg via ORAL
  Administered 2018-08-08: 5 mg via ORAL
  Administered 2018-08-08 (×3): 10 mg via ORAL
  Administered 2018-08-09: 5 mg via ORAL
  Administered 2018-08-09 – 2018-08-10 (×3): 10 mg via ORAL
  Administered 2018-08-10: 5 mg via ORAL
  Administered 2018-08-10 – 2018-08-11 (×2): 10 mg via ORAL
  Filled 2018-08-06 (×2): qty 1
  Filled 2018-08-06 (×7): qty 2
  Filled 2018-08-06: qty 1
  Filled 2018-08-06 (×4): qty 2
  Filled 2018-08-06: qty 1
  Filled 2018-08-06 (×2): qty 2

## 2018-08-06 MED ORDER — SODIUM CHLORIDE 0.9 % IV SOLN
1.5000 g | Freq: Two times a day (BID) | INTRAVENOUS | Status: AC
  Administered 2018-08-06 – 2018-08-08 (×4): 1.5 g via INTRAVENOUS
  Filled 2018-08-06 (×4): qty 1.5

## 2018-08-06 MED ORDER — ROCURONIUM BROMIDE 50 MG/5ML IV SOSY
PREFILLED_SYRINGE | INTRAVENOUS | Status: AC
  Filled 2018-08-06: qty 5

## 2018-08-06 MED ORDER — DEXAMETHASONE SODIUM PHOSPHATE 10 MG/ML IJ SOLN
INTRAMUSCULAR | Status: AC
  Filled 2018-08-06: qty 1

## 2018-08-06 MED ORDER — SODIUM CHLORIDE 0.45 % IV SOLN
INTRAVENOUS | Status: DC | PRN
  Administered 2018-08-06: 14:00:00 via INTRAVENOUS

## 2018-08-06 MED ORDER — METOPROLOL TARTRATE 12.5 MG HALF TABLET
12.5000 mg | ORAL_TABLET | Freq: Once | ORAL | Status: AC
  Administered 2018-08-06: 12.5 mg via ORAL
  Filled 2018-08-06: qty 1

## 2018-08-06 MED ORDER — MORPHINE SULFATE (PF) 2 MG/ML IV SOLN
1.0000 mg | INTRAVENOUS | Status: DC | PRN
  Administered 2018-08-06: 4 mg via INTRAVENOUS
  Administered 2018-08-06: 2 mg via INTRAVENOUS
  Administered 2018-08-07: 4 mg via INTRAVENOUS
  Filled 2018-08-06 (×2): qty 2

## 2018-08-06 MED ORDER — LACTATED RINGERS IV SOLN
INTRAVENOUS | Status: DC
  Administered 2018-08-07: 02:00:00 via INTRAVENOUS

## 2018-08-06 MED ORDER — MORPHINE SULFATE (PF) 2 MG/ML IV SOLN: 1.0000 mg | INTRAVENOUS | Status: DC | PRN

## 2018-08-06 MED ORDER — ALBUMIN HUMAN 5 % IV SOLN
250.0000 mL | INTRAVENOUS | Status: AC | PRN
  Administered 2018-08-06: 12.5 g via INTRAVENOUS

## 2018-08-06 MED ORDER — MUPIROCIN 2 % EX OINT
TOPICAL_OINTMENT | Freq: Two times a day (BID) | CUTANEOUS | Status: DC
  Administered 2018-08-06: 1 via NASAL
  Filled 2018-08-06 (×2): qty 22

## 2018-08-06 MED ORDER — SODIUM CHLORIDE 0.9% FLUSH: 3.0000 mL | INTRAVENOUS | Status: DC | PRN

## 2018-08-06 MED ORDER — PHENYLEPHRINE HCL 10 MG/ML IJ SOLN
INTRAMUSCULAR | Status: AC
  Filled 2018-08-06: qty 1

## 2018-08-06 MED ORDER — HEMOSTATIC AGENTS (NO CHARGE) OPTIME
TOPICAL | Status: DC | PRN
  Administered 2018-08-06: 3 via TOPICAL

## 2018-08-06 MED ORDER — ROCURONIUM BROMIDE 10 MG/ML (PF) SYRINGE
PREFILLED_SYRINGE | INTRAVENOUS | Status: DC | PRN
  Administered 2018-08-06: 50 mg via INTRAVENOUS
  Administered 2018-08-06: 100 mg via INTRAVENOUS
  Administered 2018-08-06 (×2): 50 mg via INTRAVENOUS

## 2018-08-06 MED ORDER — SODIUM CHLORIDE 0.9 % IV SOLN
INTRAVENOUS | Status: AC
  Administered 2018-08-06: 14:00:00 via INTRAVENOUS

## 2018-08-06 MED ORDER — ROCURONIUM BROMIDE 50 MG/5ML IV SOSY
PREFILLED_SYRINGE | INTRAVENOUS | Status: AC
  Filled 2018-08-06: qty 10

## 2018-08-06 MED ORDER — CHLORHEXIDINE GLUCONATE 0.12 % MT SOLN
15.0000 mL | Freq: Once | OROMUCOSAL | Status: AC
  Administered 2018-08-06: 15 mL via OROMUCOSAL
  Filled 2018-08-06: qty 15

## 2018-08-06 MED ORDER — HEPARIN SODIUM (PORCINE) 1000 UNIT/ML IJ SOLN
INTRAMUSCULAR | Status: AC
  Filled 2018-08-06: qty 1

## 2018-08-06 MED ORDER — FENTANYL CITRATE (PF) 250 MCG/5ML IJ SOLN
INTRAMUSCULAR | Status: DC | PRN
  Administered 2018-08-06: 250 ug via INTRAVENOUS
  Administered 2018-08-06: 50 ug via INTRAVENOUS
  Administered 2018-08-06: 250 ug via INTRAVENOUS
  Administered 2018-08-06: 100 ug via INTRAVENOUS
  Administered 2018-08-06: 500 ug via INTRAVENOUS
  Administered 2018-08-06: 100 ug via INTRAVENOUS

## 2018-08-06 MED ORDER — PHENYLEPHRINE HCL-NACL 20-0.9 MG/250ML-% IV SOLN
0.0000 ug/min | INTRAVENOUS | Status: DC
  Administered 2018-08-06: 2 ug/min via INTRAVENOUS
  Filled 2018-08-06 (×2): qty 250

## 2018-08-06 MED ORDER — ACETAMINOPHEN 160 MG/5ML PO SOLN: 1000.0000 mg | Freq: Four times a day (QID) | ORAL | Status: DC

## 2018-08-06 MED ORDER — ONDANSETRON HCL 4 MG/2ML IJ SOLN: 4.0000 mg | Freq: Four times a day (QID) | INTRAMUSCULAR | Status: DC | PRN

## 2018-08-06 MED ORDER — PANTOPRAZOLE SODIUM 40 MG PO TBEC
40.0000 mg | DELAYED_RELEASE_TABLET | Freq: Every day | ORAL | Status: DC
  Administered 2018-08-08 – 2018-08-11 (×4): 40 mg via ORAL
  Filled 2018-08-06 (×4): qty 1

## 2018-08-06 MED ORDER — PHENYLEPHRINE 40 MCG/ML (10ML) SYRINGE FOR IV PUSH (FOR BLOOD PRESSURE SUPPORT)
PREFILLED_SYRINGE | INTRAVENOUS | Status: DC | PRN
  Administered 2018-08-06: 120 ug via INTRAVENOUS

## 2018-08-06 MED ORDER — DEXAMETHASONE SODIUM PHOSPHATE 10 MG/ML IJ SOLN
INTRAMUSCULAR | Status: DC | PRN
  Administered 2018-08-06: 10 mg via INTRAVENOUS

## 2018-08-06 MED ORDER — PHENYLEPHRINE 40 MCG/ML (10ML) SYRINGE FOR IV PUSH (FOR BLOOD PRESSURE SUPPORT)
PREFILLED_SYRINGE | INTRAVENOUS | Status: AC
  Filled 2018-08-06: qty 10

## 2018-08-06 MED ORDER — LIDOCAINE 2% (20 MG/ML) 5 ML SYRINGE
INTRAMUSCULAR | Status: DC | PRN
  Administered 2018-08-06: 80 mg via INTRAVENOUS

## 2018-08-06 MED ORDER — SODIUM BICARBONATE 8.4 % IV SOLN
50.0000 meq | Freq: Once | INTRAVENOUS | Status: AC
  Administered 2018-08-06: 50 meq via INTRAVENOUS

## 2018-08-06 MED ORDER — INSULIN REGULAR(HUMAN) IN NACL 100-0.9 UT/100ML-% IV SOLN
INTRAVENOUS | Status: DC
  Administered 2018-08-06: 100 [IU] via INTRAVENOUS

## 2018-08-06 MED ORDER — NITROGLYCERIN IN D5W 200-5 MCG/ML-% IV SOLN
0.0000 ug/min | INTRAVENOUS | Status: DC
  Administered 2018-08-06: 10 ug/min via INTRAVENOUS
  Filled 2018-08-06: qty 250

## 2018-08-06 MED ORDER — ATORVASTATIN CALCIUM 40 MG PO TABS
40.0000 mg | ORAL_TABLET | Freq: Every day | ORAL | Status: DC
  Administered 2018-08-07 – 2018-08-11 (×5): 40 mg via ORAL
  Filled 2018-08-06 (×5): qty 1

## 2018-08-06 SURGICAL SUPPLY — 135 items
ADAPTER CARDIO PERF ANTE/RETRO (ADAPTER) ×4 IMPLANT
ADH SKN CLS APL DERMABOND .7 (GAUZE/BANDAGES/DRESSINGS) ×2
ADPR PRFSN 84XANTGRD RTRGD (ADAPTER) ×2
BAG DECANTER FOR FLEXI CONT (MISCELLANEOUS) ×8 IMPLANT
BANDAGE ACE 4X5 VEL STRL LF (GAUZE/BANDAGES/DRESSINGS) ×4 IMPLANT
BANDAGE ACE 6X5 VEL STRL LF (GAUZE/BANDAGES/DRESSINGS) ×4 IMPLANT
BANDAGE ELASTIC 4 VELCRO ST LF (GAUZE/BANDAGES/DRESSINGS) ×2 IMPLANT
BANDAGE ELASTIC 6 VELCRO ST LF (GAUZE/BANDAGES/DRESSINGS) ×2 IMPLANT
BASKET HEART  (ORDER IN 25'S) (MISCELLANEOUS) ×1
BASKET HEART (ORDER IN 25'S) (MISCELLANEOUS) ×1
BASKET HEART (ORDER IN 25S) (MISCELLANEOUS) ×2 IMPLANT
BLADE CLIPPER SURG (BLADE) ×2 IMPLANT
BLADE STERNUM SYSTEM 6 (BLADE) ×4 IMPLANT
BLADE SURG 11 STRL SS (BLADE) ×6 IMPLANT
BNDG GAUZE ELAST 4 BULKY (GAUZE/BANDAGES/DRESSINGS) ×4 IMPLANT
CANISTER SUCT 3000ML PPV (MISCELLANEOUS) ×4 IMPLANT
CANNULA EZ GLIDE AORTIC 21FR (CANNULA) ×6 IMPLANT
CANNULA GUNDRY RCSP 15FR (MISCELLANEOUS) ×4 IMPLANT
CANNULA SOFTFLOW AORTIC 7M21FR (CANNULA) ×4 IMPLANT
CATH CPB KIT OWEN (MISCELLANEOUS) ×4 IMPLANT
CATH HEART VENT LEFT (CATHETERS) ×2 IMPLANT
CATH THORACIC 36FR (CATHETERS) ×2 IMPLANT
CATH THORACIC 36FR RT ANG (CATHETERS) ×2 IMPLANT
CLIP VESOCCLUDE MED 24/CT (CLIP) IMPLANT
CLIP VESOCCLUDE SM WIDE 24/CT (CLIP) IMPLANT
CONT SPEC 4OZ CLIKSEAL STRL BL (MISCELLANEOUS) ×2 IMPLANT
COVER SURGICAL LIGHT HANDLE (MISCELLANEOUS) ×2 IMPLANT
COVER WAND RF STERILE (DRAPES) ×2 IMPLANT
CRADLE DONUT ADULT HEAD (MISCELLANEOUS) ×4 IMPLANT
DERMABOND ADVANCED (GAUZE/BANDAGES/DRESSINGS) ×2
DERMABOND ADVANCED .7 DNX12 (GAUZE/BANDAGES/DRESSINGS) IMPLANT
DRAIN CHANNEL 32F RND 10.7 FF (WOUND CARE) ×8 IMPLANT
DRAPE BILATERAL SPLIT (DRAPES) IMPLANT
DRAPE CARDIOVASCULAR INCISE (DRAPES) ×4
DRAPE CV SPLIT W-CLR ANES SCRN (DRAPES) IMPLANT
DRAPE INCISE IOBAN 66X45 STRL (DRAPES) ×6 IMPLANT
DRAPE SLUSH/WARMER DISC (DRAPES) ×4 IMPLANT
DRAPE SRG 135X102X78XABS (DRAPES) ×2 IMPLANT
DRSG AQUACEL AG ADV 3.5X14 (GAUZE/BANDAGES/DRESSINGS) ×2 IMPLANT
DRSG COVADERM 4X14 (GAUZE/BANDAGES/DRESSINGS) ×2 IMPLANT
ELECT BLADE 4.0 EZ CLEAN MEGAD (MISCELLANEOUS) ×4
ELECT REM PT RETURN 9FT ADLT (ELECTROSURGICAL) ×8
ELECTRODE BLDE 4.0 EZ CLN MEGD (MISCELLANEOUS) ×2 IMPLANT
ELECTRODE REM PT RTRN 9FT ADLT (ELECTROSURGICAL) ×4 IMPLANT
FELT TEFLON 1X6 (MISCELLANEOUS) ×6 IMPLANT
GAUZE SPONGE 4X4 12PLY STRL (GAUZE/BANDAGES/DRESSINGS) ×8 IMPLANT
GAUZE SPONGE 4X4 12PLY STRL LF (GAUZE/BANDAGES/DRESSINGS) ×4 IMPLANT
GLOVE BIO SURGEON STRL SZ 6 (GLOVE) ×8 IMPLANT
GLOVE BIO SURGEON STRL SZ 6.5 (GLOVE) ×8 IMPLANT
GLOVE BIO SURGEON STRL SZ7 (GLOVE) IMPLANT
GLOVE BIO SURGEON STRL SZ7.5 (GLOVE) IMPLANT
GLOVE BIO SURGEONS STRL SZ 6.5 (GLOVE) ×8
GLOVE ORTHO TXT STRL SZ7.5 (GLOVE) ×12 IMPLANT
GOWN STRL REUS W/ TWL LRG LVL3 (GOWN DISPOSABLE) ×8 IMPLANT
GOWN STRL REUS W/TWL LRG LVL3 (GOWN DISPOSABLE) ×28
HEMOSTAT POWDER SURGIFOAM 1G (HEMOSTASIS) ×6 IMPLANT
INSERT FOGARTY XLG (MISCELLANEOUS) ×4 IMPLANT
KIT BASIN OR (CUSTOM PROCEDURE TRAY) ×4 IMPLANT
KIT COMBO MINI 4X17COR-KNOT (Prosthesis & Implant Heart) ×1 IMPLANT
KIT SUCTION CATH 14FR (SUCTIONS) ×14 IMPLANT
KIT SUT CK MINI COMBO 4X17 (Prosthesis & Implant Heart) ×1 IMPLANT
KIT TURNOVER KIT B (KITS) ×4 IMPLANT
KIT VASOVIEW ACCESSORY VH 2004 (KITS) IMPLANT
KIT VASOVIEW HEMOPRO 2 VH 4000 (KITS) ×2 IMPLANT
KIT VASOVIEW HEMOPRO VH 3000 (KITS) ×2 IMPLANT
LEAD PACING MYOCARDI (MISCELLANEOUS) ×4 IMPLANT
LINE VENT (MISCELLANEOUS) ×2 IMPLANT
MARKER GRAFT CORONARY BYPASS (MISCELLANEOUS) ×8 IMPLANT
NS IRRIG 1000ML POUR BTL (IV SOLUTION) ×22 IMPLANT
PACK E OPEN HEART (SUTURE) ×4 IMPLANT
PACK OPEN HEART (CUSTOM PROCEDURE TRAY) ×4 IMPLANT
PAD ARMBOARD 7.5X6 YLW CONV (MISCELLANEOUS) ×8 IMPLANT
PAD ELECT DEFIB RADIOL ZOLL (MISCELLANEOUS) ×4 IMPLANT
PENCIL BUTTON HOLSTER BLD 10FT (ELECTRODE) ×4 IMPLANT
POWDER SURGICEL 3.0 GRAM (HEMOSTASIS) ×6 IMPLANT
PUNCH AORTIC ROTATE 4.0MM (MISCELLANEOUS) IMPLANT
PUNCH AORTIC ROTATE 4.5MM 8IN (MISCELLANEOUS) ×2 IMPLANT
PUNCH AORTIC ROTATE 5MM 8IN (MISCELLANEOUS) IMPLANT
SET CARDIOPLEGIA MPS 5001102 (MISCELLANEOUS) ×2 IMPLANT
SET IRRIG TUBING LAPAROSCOPIC (IRRIGATION / IRRIGATOR) ×4 IMPLANT
SOLUTION ANTI FOG 6CC (MISCELLANEOUS) ×4 IMPLANT
SPONGE LAP 18X18 X RAY DECT (DISPOSABLE) IMPLANT
SPONGE LAP 4X18 RFD (DISPOSABLE) IMPLANT
SUT BONE WAX W31G (SUTURE) ×4 IMPLANT
SUT ETHIBON 2 0 V 52N 30 (SUTURE) ×8 IMPLANT
SUT ETHIBON EXCEL 2-0 V-5 (SUTURE) IMPLANT
SUT ETHIBOND 2 0 SH (SUTURE)
SUT ETHIBOND 2 0 SH 36X2 (SUTURE) IMPLANT
SUT ETHIBOND 2 0 V4 (SUTURE) IMPLANT
SUT ETHIBOND 2 0V4 GREEN (SUTURE) IMPLANT
SUT ETHIBOND 4 0 RB 1 (SUTURE) IMPLANT
SUT ETHIBOND V-5 VALVE (SUTURE) IMPLANT
SUT ETHIBOND X763 2 0 SH 1 (SUTURE) ×18 IMPLANT
SUT MNCRL AB 3-0 PS2 18 (SUTURE) ×8 IMPLANT
SUT MNCRL AB 4-0 PS2 18 (SUTURE) ×2 IMPLANT
SUT PDS AB 1 CTX 36 (SUTURE) ×8 IMPLANT
SUT PROLENE 2 0 SH DA (SUTURE) IMPLANT
SUT PROLENE 3 0 SH DA (SUTURE) ×8 IMPLANT
SUT PROLENE 3 0 SH1 36 (SUTURE) IMPLANT
SUT PROLENE 4 0 RB 1 (SUTURE) ×16
SUT PROLENE 4 0 SH DA (SUTURE) ×8 IMPLANT
SUT PROLENE 4-0 RB1 .5 CRCL 36 (SUTURE) ×4 IMPLANT
SUT PROLENE 5 0 C 1 36 (SUTURE) IMPLANT
SUT PROLENE 6 0 C 1 30 (SUTURE) IMPLANT
SUT PROLENE 7.0 RB 3 (SUTURE) ×14 IMPLANT
SUT PROLENE 8 0 BV175 6 (SUTURE) IMPLANT
SUT PROLENE BLUE 7 0 (SUTURE) ×4 IMPLANT
SUT PROLENE POLY MONO (SUTURE) IMPLANT
SUT SILK  1 MH (SUTURE) ×2
SUT SILK 1 MH (SUTURE) ×2 IMPLANT
SUT SILK 2 0 SH CR/8 (SUTURE) IMPLANT
SUT SILK 3 0 SH CR/8 (SUTURE) IMPLANT
SUT STEEL 6MS V (SUTURE) IMPLANT
SUT STEEL STERNAL CCS#1 18IN (SUTURE) IMPLANT
SUT STEEL SZ 6 DBL 3X14 BALL (SUTURE) ×6 IMPLANT
SUT VIC AB 1 CTX 36 (SUTURE)
SUT VIC AB 1 CTX36XBRD ANBCTR (SUTURE) IMPLANT
SUT VIC AB 2-0 CT1 27 (SUTURE) ×4
SUT VIC AB 2-0 CT1 TAPERPNT 27 (SUTURE) IMPLANT
SUT VIC AB 2-0 CTX 27 (SUTURE) IMPLANT
SUT VIC AB 3-0 SH 27 (SUTURE)
SUT VIC AB 3-0 SH 27X BRD (SUTURE) IMPLANT
SUT VIC AB 3-0 X1 27 (SUTURE) IMPLANT
SUT VICRYL 4-0 PS2 18IN ABS (SUTURE) IMPLANT
SYSTEM SAHARA CHEST DRAIN ATS (WOUND CARE) ×4 IMPLANT
TAPE CLOTH SURG 4X10 WHT LF (GAUZE/BANDAGES/DRESSINGS) ×2 IMPLANT
TAPE PAPER 2X10 WHT MICROPORE (GAUZE/BANDAGES/DRESSINGS) ×2 IMPLANT
TOWEL GREEN STERILE (TOWEL DISPOSABLE) ×4 IMPLANT
TOWEL GREEN STERILE FF (TOWEL DISPOSABLE) ×4 IMPLANT
TRAY FOLEY SLVR 16FR TEMP STAT (SET/KITS/TRAYS/PACK) ×4 IMPLANT
TUBING INSUFFLATION (TUBING) ×4 IMPLANT
UNDERPAD 30X30 (UNDERPADS AND DIAPERS) ×4 IMPLANT
VALVE SYSTEM EDWS INTUITY 25A (Prosthesis & Implant Heart) ×2 IMPLANT
VENT LEFT HEART 12002 (CATHETERS) ×4
WATER STERILE IRR 1000ML POUR (IV SOLUTION) ×8 IMPLANT

## 2018-08-06 NOTE — Transfer of Care (Signed)
Immediate Anesthesia Transfer of Care Note  Patient: Blake Griffin  Procedure(s) Performed: CORONARY ARTERY BYPASS GRAFTING (CABG) times one using right greater saphenous vein harvested endoscopically and aortic valve replacement. (N/A Chest) AORTIC VALVE REPLACEMENT (AVR) (N/A Chest) TRANSESOPHAGEAL ECHOCARDIOGRAM (TEE) (N/A )  Patient Location: PACU and ICU  Anesthesia Type:General  Level of Consciousness: sedated and Patient remains intubated per anesthesia plan  Airway & Oxygen Therapy: Patient remains intubated per anesthesia plan  Post-op Assessment: Report given to RN and Post -op Vital signs reviewed and stable  Post vital signs: Reviewed and stable  Last Vitals:  Vitals Value Taken Time  BP    Temp    Pulse    Resp 15 08/06/2018 12:58 PM  SpO2    Vitals shown include unvalidated device data.  Last Pain:  Vitals:   08/06/18 0608  TempSrc:   PainSc: 0-No pain      Patients Stated Pain Goal: 4 (08/06/18 1610)  Complications: No apparent anesthesia complications

## 2018-08-06 NOTE — Progress Notes (Signed)
Based on pts last ABG at 1817 MD ordered 1amp of Bicarb.

## 2018-08-06 NOTE — Brief Op Note (Signed)
08/06/2018  12:21 PM  PATIENT:  Blake Griffin  62 y.o. male  PRE-OPERATIVE DIAGNOSIS:  AS CAD  POST-OPERATIVE DIAGNOSIS:  AS, CAD  PROCEDURE:  Procedure(s):  CORONARY ARTERY BYPASS GRAFTING x 1 -SVG to RCA  AORTIC VALVE REPLACEMENT (AVR) (N/A) -25 mm [employer] Elite Aortic Valve  ENDOSCOPIC HARVEST GREATER SAPHENOUS VEIN -Right Leg  TRANSESOPHAGEAL ECHOCARDIOGRAM (TEE) (N/A)  SURGEON:  Surgeon(s) and Role:    Purcell Nails, MD - Primary  PHYSICIAN ASSISTANT: Erin Barrett PA-C  ANESTHESIA:   general  EBL:  800 mL   BLOOD ADMINISTERED:CELLSAVER  DRAINS: Mediastinal Chest Drains   LOCAL MEDICATIONS USED:  NONE  SPECIMEN:  Aortic Valve Leaflets  DISPOSITION OF SPECIMEN:  PATHOLOGY  COUNTS:  YES  TOURNIQUET:  * No tourniquets in log *  DICTATION: .Dragon Dictation  PLAN OF CARE: Admit to inpatient   PATIENT DISPOSITION:  ICU - intubated and hemodynamically stable.   Delay start of Pharmacological VTE agent (>24hrs) due to surgical blood loss or risk of bleeding: yes

## 2018-08-06 NOTE — Plan of Care (Signed)
  Problem: Activity: Goal: Risk for activity intolerance will decrease Outcome: Progressing   Problem: Cardiac: Goal: Will achieve and/or maintain hemodynamic stability Outcome: Progressing   Problem: Clinical Measurements: Goal: Postoperative complications will be avoided or minimized Outcome: Progressing   Problem: Respiratory: Goal: Respiratory status will improve Outcome: Progressing   Problem: Skin Integrity: Goal: Wound healing without signs and symptoms of infection Outcome: Progressing Goal: Risk for impaired skin integrity will decrease Outcome: Progressing   Problem: Urinary Elimination: Goal: Ability to achieve and maintain adequate renal perfusion and functioning will improve Outcome: Progressing   

## 2018-08-06 NOTE — Anesthesia Procedure Notes (Signed)
Central Venous Catheter Insertion Performed by: Val Eagle, MD, anesthesiologist Start/End10/24/2019 6:55 AM, 08/06/2018 7:02 AM Patient location: Pre-op. Preanesthetic checklist: patient identified, IV checked, site marked, risks and benefits discussed, surgical consent, monitors and equipment checked, pre-op evaluation, timeout performed and anesthesia consent Hand hygiene performed  and maximum sterile barriers used  PA cath was placed.Swan type:thermodilution Procedure performed without using ultrasound guided technique. Attempts: 1 Patient tolerated the procedure well with no immediate complications.

## 2018-08-06 NOTE — Anesthesia Postprocedure Evaluation (Signed)
Anesthesia Post Note  Patient: Blake Griffin  Procedure(s) Performed: CORONARY ARTERY BYPASS GRAFTING (CABG) times one using right greater saphenous vein harvested endoscopically and aortic valve replacement. (N/A Chest) AORTIC VALVE REPLACEMENT (AVR) (N/A Chest) TRANSESOPHAGEAL ECHOCARDIOGRAM (TEE) (N/A )     Patient location during evaluation: SICU Anesthesia Type: General Level of consciousness: sedated and patient remains intubated per anesthesia plan Pain management: pain level controlled Vital Signs Assessment: post-procedure vital signs reviewed and stable Respiratory status: patient remains intubated per anesthesia plan and respiratory function stable Cardiovascular status: stable Postop Assessment: no apparent nausea or vomiting Anesthetic complications: no    Last Vitals:  Vitals:   08/06/18 1400 08/06/18 1533  BP: 99/61   Pulse: 72   Resp: 18   Temp: 36.5 C   SpO2: 92% 96%    Last Pain:  Vitals:   08/06/18 1250  TempSrc: Core  PainSc:                  Cecile Hearing

## 2018-08-06 NOTE — Progress Notes (Signed)
Echocardiogram Transesophageal has been performed.  Pieter Partridge 08/06/2018, 8:31 AM

## 2018-08-06 NOTE — Op Note (Signed)
CARDIOTHORACIC SURGERY OPERATIVE NOTE  Date of Procedure:  08/06/2018  Preoperative Diagnosis:   Severe Aortic Stenosis  Severe Single-vessel Coronary Artery Disease  Postoperative Diagnosis: Same  Procedure:    Aortic Valve Replacement  Edwards Intuity Elite Rapid-deployment Stented Bovine Pericardial Tissue Valve (size 31m, ref # [id number] serial # [id number]   Coronary Artery Bypass Grafting x 1  Saphenous Vein Graft to Distal Right Coronary Artery  Endoscopic Vein Harvest from Right Thigh  Surgeon: CValentina Gu ORoxy Manns MD  Assistant: EEllwood Handler PA-C  Anesthesia: SHoy Morn MD  Operative Findings:  Bicuspid aortic valve (Sievers type I) with severe aortic stenosis  Normal left ventricular systolic function  Good quality saphenous vein conduit  Good quality target vessel for grafting            BRIEF CLINICAL NOTE AND INDICATIONS FOR SURGERY  Patient is a 62year old male with history of aortic stenosis, hypertension, hyperlipidemia, and long-standing tobacco abuse who has been referred for surgical consultation to discuss treatment options for management of severe symptomatic aortic stenosis and single-vessel coronary artery disease.  Patient states that his primary care physician noted a heart murmur on physical exam several years ago. Over the past several months the patient has developed symptoms of exertional shortness of breath and chest tightness. The patient lives a relatively sedentary lifestyle and drives a truck for living. Despite this he has developed increasing symptoms of exertional chest tightness associated with exertional shortness of breath. He was seen by his primary care physician and referred for cardiology consultation. He was evaluated by Dr. RDorian Furnacepromptly scheduled for transthoracic echocardiogram and diagnostic cardiac catheterization. Echocardiogram performed June 29, 2018 revealed severe, essentially critical,  aortic stenosis with preserved left ventricular systolic function. Peak velocity across the aortic valve measured 4.7 m/s corresponding to mean transvalvular gradient estimated 54 mmHg. The DVI was quite low at 0.16 with aortic valve area calculated 0.54 cm. Left ventricular systolic function remains preserved with ejection fraction estimated 60 to 65%. There was moderate left ventricular hypertrophy with significant diastolic dysfunction. Left and right heart catheterization was performed the following day by Dr. STamala Julian Catheterization confirmed the presence of critical aortic stenosis with peak to peak and mean transvalvular gradients measured 76 and 64 mmHg respectively, corresponding to aortic valve area calculated 0.91 cm. The patient was also noted to have significant single-vessel coronary artery disease with 60 to 75% hazy stenosis of the mid right coronary artery. Right heart pressures were normal. Cardiothoracic surgical consultation was requested.  The patient has been seen in consultation and counseled at length regarding the indications, risks and potential benefits of surgery.  All questions have been answered, and the patient provides full informed consent for the operation as described.    DETAILS OF THE OPERATIVE PROCEDURE  Preparation:  The patient is brought to the operating room on the above mentioned date and central monitoring was established by the anesthesia team including placement of Swan-Ganz catheter and radial arterial line. The patient is placed in the supine position on the operating table.  Intravenous antibiotics are administered. General endotracheal anesthesia is induced uneventfully. A Foley catheter is placed.  Baseline transesophageal echocardiogram was performed.  Findings were notable for congenitally bicuspid aortic valve with severe aortic stenosis and mild aortic insufficiency.  There was normal left ventricular systolic function.  There was moderate  left ventricular hypertrophy.  The patient's chest, abdomen, both groins, and both lower extremities are prepared and draped in a sterile manner. A time out procedure is  performed.   Surgical Approach and Conduit Harvest:  The greater saphenous vein is obtained from the patient's right thigh using endoscopic vein harvest technique. The saphenous vein is notably good quality conduit. After removal of the saphenous vein, the small surgical incisions in the lower extremity are closed with absorbable suture.  A median sternotomy incision was performed.  The pericardium is open.  The ascending aorta is normal in appearance.  Extracorporeal Cardiopulmonary Bypass and Myocardial Protection:  The ascending aorta and the right atrium are cannulated for cardiopulmonary bypass.  Adequate heparinization is verified.    A retrograde cardioplegia cannula is placed through the right atrium into the coronary sinus.  The operative field was continuously flooded with carbon dioxide gas.  The entire pre-bypass portion of the operation was notable for stable hemodynamics.  Cardiopulmonary bypass was begun and a left ventricular vent placed through the right superior pulmonary vein.  The surface of the heart inspected. Distal target vessels are selected for coronary artery bypass grafting. A cardioplegia cannula is placed in the ascending aorta.  A temperature probe was placed in the interventricular septum.  The patient is allowed to cool passively to [facility] systemic temperature.  The aortic cross clamp is applied and cardioplegia is delivered initially in an antegrade fashion through the aortic root using modified [place] cold blood cardioplegia ([place] blood cardioplegia protocol).   The initial cardioplegic arrest is rapid with early diastolic arrest.  Repeat dose of cardioplegia are administered through the subsequently placed vein graft in order to maintain completely flat electrocardiogram and septal  myocardial temperature below 15C.  Myocardial protection was felt to be excellent.   Coronary Artery Bypass Grafting:  The distal right coronary artery was grafted using a reversed saphenous vein graft in an end-to-side fashion.  At the site of distal anastomosis the target vessel was good quality and measured approximately 2.2 mm in diameter.   Aortic Valve Replacement:  An oblique transverse aortotomy incision was performed.  The aortic valve was inspected and notable for a Sievers type I bicuspid aortic valve with single raphae between the left and right leaflets.  There was severe aortic stenosis.  The aortic valve leaflets were excised sharply and the aortic annulus decalcified.  Decalcification was notably straightforward.  The aortic annulus was sized to accept a 25 mm prosthesis.  The aortic root and left ventricle were irrigated with copious cold saline solution.  Aortic valve replacement was performed using an [employer] rapid deployment pericardial tissue valve (size 25 mm, model #8300AB, serial # [id number]).  The valve was rinsed in saline for manufacture recommendations. A total of 4 individual guiding sutures are placed through the aortic annulus, each at the corresponding nadir of each sinus of Valsalva.  The valve was attached to the delivery device and the guiding sutures placed through the valve sewing cuff. The valve is lowered into place. Care is taken to insure that the valve is completely seated in the annulus. Each of the guide sutures are secured using Cor knot clips.  The valve stent is deployed by inflating the deployment balloon to 5.0 atm pressure and holding for 10 seconds. The balloon is deflated the valve holder sutures cut In the deployment system removed.   The valve is carefully inspected to make sure that it is seated appropriately. Endoscopic visualization through the valve is utilized to inspect the left ventricular outflow tract. Aortic root was filled with  saline to make sure the valve is competent. Rewarming is begun.  The aortotomy was closed using a 2-layer closure of running 4-0 Prolene suture.   Procedure Completion:  The proximal vein graft anastomosis was placed directly to the ascending aorta prior to removal of the aortic cross clamp.  One final dose of warm retrograde "reanimation dose" cardioplegia was administered through the coronary sinus catheter while all air was evacuated through the aortic root.  The aortic cross clamp was removed after a total cross clamp time of 64 minutes.  All proximal and distal coronary anastomoses were inspected for hemostasis and appropriate graft orientation. Epicardial pacing wires are fixed to the inferior right ventricular freewall and to the right atrial appendage. The patient is rewarmed to 37C temperature. The aortic and left ventricular vents were removed.  The patient is weaned and disconnected from cardiopulmonary bypass.  The patient's rhythm at separation from bypass was AV paced.  The patient was weaned from cardiopulmonary bypass without any inotropic support. Total cardiopulmonary bypass time for the operation was 93 minutes.  Followup transesophageal echocardiogram performed after separation from bypass revealed a well-seated bioprosthetic tissue valve in the aortic position that was functioning normally.  There was no paravalvular leak.  Deep transgastric views are obtained from multiple angles to make certain the valve is well-seated.  Mean transvalvular gradient across the valve is estimated 6 mmHg.  There were otherwise no changes from the preoperative exam.  The aortic and venous cannula were removed uneventfully. Protamine was administered to reverse the anticoagulation. The mediastinum and pleural space were inspected for hemostasis and irrigated with saline solution. The mediastinum was drained using 2 chest tubes placed through separate stab incisions inferiorly.  The soft tissues  anterior to the aorta were reapproximated loosely. The sternum is closed with double strength sternal wire. The soft tissues anterior to the sternum were closed in multiple layers and the skin is closed with a running subcuticular skin closure.  The post-bypass portion of the operation was notable for stable rhythm and hemodynamics.  No blood products were administered during the operation.   Disposition:  The patient tolerated the procedure well and is transported to the surgical intensive care in stable condition. There are no intraoperative complications. All sponge instrument and needle counts are verified correct at completion of the operation.   Valentina Gu. Roxy Manns MD 08/06/2018 12:26 PM

## 2018-08-06 NOTE — Anesthesia Procedure Notes (Signed)
Arterial Line Insertion Start/End10/24/2019 7:02 AM Performed by: Laruth Bouchard., CRNA, CRNA  Patient location: Pre-op. Preanesthetic checklist: patient identified, IV checked, site marked, risks and benefits discussed, surgical consent, monitors and equipment checked, pre-op evaluation, timeout performed and anesthesia consent Lidocaine 1% used for infiltration and patient sedated Left, radial was placed Catheter size: 20 G Hand hygiene performed  and maximum sterile barriers used   Attempts: 1 Procedure performed without using ultrasound guided technique. Following insertion, dressing applied and Biopatch. Post procedure assessment: normal and unchanged  Patient tolerated the procedure well with no immediate complications.

## 2018-08-06 NOTE — Anesthesia Procedure Notes (Signed)
Central Venous Catheter Insertion Performed by: Val Eagle, MD, anesthesiologist Start/End10/24/2019 6:55 AM, 08/06/2018 7:02 AM Patient location: Pre-op. Preanesthetic checklist: patient identified, IV checked, site marked, risks and benefits discussed, surgical consent, monitors and equipment checked, pre-op evaluation, timeout performed and anesthesia consent Position: supine Lidocaine 1% used for infiltration and patient sedated Hand hygiene performed  and maximum sterile barriers used  Catheter size: 8.5 Fr Total catheter length 10. Sheath introducer Procedure performed using ultrasound guided technique. Ultrasound Notes:anatomy identified, needle tip was noted to be adjacent to the nerve/plexus identified, no ultrasound evidence of intravascular and/or intraneural injection and image(s) printed for medical record Attempts: 1 Following insertion, line sutured, dressing applied and Biopatch. Post procedure assessment: blood return through all ports, free fluid flow and no air  Patient tolerated the procedure well with no immediate complications.

## 2018-08-06 NOTE — Progress Notes (Signed)
Patient ID: Blake Griffin, male   DOB: [birth year], 62 y.o.   MRN: [id number]  TCTS Evening Rounds:   Hemodynamically stable  CI = 2.8 Low dose neo.  Extubated  Urine output good  CT output low  CBC    Component Value Date/Time   WBC 15.0 (H) 08/06/2018 1301   RBC 3.97 (L) 08/06/2018 1301   HGB 11.2 (L) 08/06/2018 1823   HGB 14.2 06/29/2018 0938   HCT 33.0 (L) 08/06/2018 1823   HCT 41.8 06/29/2018 0938   PLT 126 (L) 08/06/2018 1301   PLT 226 06/29/2018 0938   MCV 100.3 (H) 08/06/2018 1301   MCV 94 06/29/2018 0938   MCH 30.7 08/06/2018 1301   MCHC 30.7 08/06/2018 1301   RDW 13.2 08/06/2018 1301   RDW 13.5 06/29/2018 0938   LYMPHSABS 2.1 06/29/2018 0938   EOSABS 0.1 06/29/2018 0938   BASOSABS 0.0 06/29/2018 0938     BMET    Component Value Date/Time   NA 141 08/06/2018 1823   NA 140 06/29/2018 0938   K 4.5 08/06/2018 1823   CL 110 08/06/2018 1823   CO2 20 (L) 08/04/2018 1343   GLUCOSE 134 (H) 08/06/2018 1823   BUN 22 08/06/2018 1823   BUN 18 06/29/2018 0938   CREATININE 0.70 08/06/2018 1823   CALCIUM 9.1 08/04/2018 1343   GFRNONAA >60 08/04/2018 1343   GFRAA >60 08/04/2018 1343     A/P:  Stable postop course. Continue current plans

## 2018-08-06 NOTE — Anesthesia Procedure Notes (Signed)
Procedure Name: Intubation Date/Time: 08/06/2018 8:03 AM Performed by: Trinna Post., CRNA Pre-anesthesia Checklist: Patient identified, Emergency Drugs available, Suction available, Patient being monitored and Timeout performed Patient Re-evaluated:Patient Re-evaluated prior to induction Oxygen Delivery Method: Circle system utilized Preoxygenation: Pre-oxygenation with 100% oxygen Induction Type: IV induction Ventilation: Mask ventilation without difficulty and Oral airway inserted - appropriate to patient size Laryngoscope Size: Mac and 4 Grade View: Grade I Tube type: Oral Tube size: 8.0 mm Number of attempts: 1 Airway Equipment and Method: Stylet Placement Confirmation: ETT inserted through vocal cords under direct vision,  positive ETCO2 and breath sounds checked- equal and bilateral Secured at: 22 cm Tube secured with: Tape Dental Injury: Teeth and Oropharynx as per pre-operative assessment

## 2018-08-06 NOTE — Interval H&P Note (Signed)
History and Physical Interval Note:  08/06/2018 6:59 AM  Blake Griffin  has presented today for surgery, with the diagnosis of AS CAD  The various methods of treatment have been discussed with the patient and family. After consideration of risks, benefits and other options for treatment, the patient has consented to  Procedure(s): CORONARY ARTERY BYPASS GRAFTING (CABG) (N/A) AORTIC VALVE REPLACEMENT (AVR) (N/A) TRANSESOPHAGEAL ECHOCARDIOGRAM (TEE) (N/A) as a surgical intervention .  The patient's history has been reviewed, patient examined, no change in status, stable for surgery.  I have reviewed the patient's chart and labs.  Questions were answered to the patient's satisfaction.     Purcell Nails

## 2018-08-06 NOTE — Procedures (Signed)
Extubation Procedure Note  Patient Details:   Name: Blake Griffin DOB: 02-09-1956 MRN: [id number]   Airway Documentation:    Vent end date: 08/06/18 Vent end time: 1700   Evaluation  O2 sats: stable throughout Complications: No apparent complications Patient did tolerate procedure well. Bilateral Breath Sounds: Clear, Diminished   Yes   Patient was extubated to a 4L  per the rapid heart wean protocol. Cuff leak was heard no stridor was noted. Patient had a NIF of -25 and a VC of 1.05L with good effort on first attempt. RN was at the bedside with RT during parameters and extubation.  Darolyn Rua 08/06/2018, 5:12 PM

## 2018-08-07 ENCOUNTER — Inpatient Hospital Stay (HOSPITAL_COMMUNITY): Payer: Commercial Managed Care - PPO

## 2018-08-07 ENCOUNTER — Encounter (HOSPITAL_COMMUNITY): Payer: Self-pay | Admitting: Thoracic Surgery (Cardiothoracic Vascular Surgery)

## 2018-08-07 LAB — BASIC METABOLIC PANEL
Anion gap: 6 (ref 5–15)
BUN: 19 mg/dL (ref 8–23)
CALCIUM: 7.4 mg/dL — AB (ref 8.9–10.3)
CO2: 24 mmol/L (ref 22–32)
Chloride: 110 mmol/L (ref 98–111)
Creatinine, Ser: 0.65 mg/dL (ref 0.61–1.24)
GFR calc Af Amer: >60 mL/min (ref >60)
GFR calc non Af Amer: >60 mL/min (ref >60)
GLUCOSE: 123 mg/dL — AB (ref 70–99)
Potassium: 4.2 mmol/L (ref 3.5–5.1)
SODIUM: 140 mmol/L (ref 135–145)

## 2018-08-07 LAB — GLUCOSE, CAPILLARY
GLUCOSE-CAPILLARY: 127 mg/dL — AB (ref 70–99)
GLUCOSE-CAPILLARY: 111 mg/dL — AB (ref 70–99)
GLUCOSE-CAPILLARY: 119 mg/dL — AB (ref 70–99)
GLUCOSE-CAPILLARY: 116 mg/dL — AB (ref 70–99)
GLUCOSE-CAPILLARY: 139 mg/dL — AB (ref 70–99)
GLUCOSE-CAPILLARY: 144 mg/dL — AB (ref 70–99)
GLUCOSE-CAPILLARY: 137 mg/dL — AB (ref 70–99)
Glucose-Capillary: 113 mg/dL — ABNORMAL HIGH (ref 70–99)
Glucose-Capillary: 115 mg/dL — ABNORMAL HIGH (ref 70–99)
Glucose-Capillary: 118 mg/dL — ABNORMAL HIGH (ref 70–99)
Glucose-Capillary: 119 mg/dL — ABNORMAL HIGH (ref 70–99)
Glucose-Capillary: 119 mg/dL — ABNORMAL HIGH (ref 70–99)
Glucose-Capillary: 120 mg/dL — ABNORMAL HIGH (ref 70–99)
Glucose-Capillary: 124 mg/dL — ABNORMAL HIGH (ref 70–99)
Glucose-Capillary: 137 mg/dL — ABNORMAL HIGH (ref 70–99)
Glucose-Capillary: 143 mg/dL — ABNORMAL HIGH (ref 70–99)
Glucose-Capillary: 133 mg/dL — ABNORMAL HIGH (ref 70–99)
Glucose-Capillary: 106 mg/dL — ABNORMAL HIGH (ref 70–99)

## 2018-08-07 LAB — CBC
HCT: 33.7 % — ABNORMAL LOW (ref 39.0–52.0)
HCT: 35.3 % — ABNORMAL LOW (ref 39.0–52.0)
Hemoglobin: 10.8 g/dL — ABNORMAL LOW (ref 13.0–17.0)
Hemoglobin: 11.4 g/dL — ABNORMAL LOW (ref 13.0–17.0)
MCH: 31.5 pg (ref 26.0–34.0)
MCH: 31.9 pg (ref 26.0–34.0)
MCHC: 32 g/dL (ref 30.0–36.0)
MCHC: 32.3 g/dL (ref 30.0–36.0)
MCV: 98.3 fL (ref 80.0–100.0)
MCV: 98.9 fL (ref 80.0–100.0)
NRBC: 0 % (ref 0.0–0.2)
PLATELETS: 124 10*3/uL — AB (ref 150–400)
PLATELETS: 129 10*3/uL — AB (ref 150–400)
RBC: 3.43 MIL/uL — ABNORMAL LOW (ref 4.22–5.81)
RBC: 3.57 MIL/uL — AB (ref 4.22–5.81)
RDW: 13.2 % (ref 11.5–15.5)
RDW: 13.3 % (ref 11.5–15.5)
WBC: 16 10*3/uL — ABNORMAL HIGH (ref 4.0–10.5)
WBC: 15.9 10*3/uL — ABNORMAL HIGH (ref 4.0–10.5)
nRBC: 0 % (ref 0.0–0.2)

## 2018-08-07 LAB — MAGNESIUM
MAGNESIUM: 2.9 mg/dL — AB (ref 1.7–2.4)
MAGNESIUM: 2.7 mg/dL — AB (ref 1.7–2.4)

## 2018-08-07 LAB — POCT I-STAT, CHEM 8
BUN: 19 mg/dL (ref 8–23)
CHLORIDE: 100 mmol/L (ref 98–111)
Calcium, Ion: 1.08 mmol/L — ABNORMAL LOW (ref 1.15–1.40)
Creatinine, Ser: 0.8 mg/dL (ref 0.61–1.24)
Glucose, Bld: 125 mg/dL — ABNORMAL HIGH (ref 70–99)
HEMATOCRIT: 33 % — AB (ref 39.0–52.0)
HEMOGLOBIN: 11.2 g/dL — AB (ref 13.0–17.0)
POTASSIUM: 4 mmol/L (ref 3.5–5.1)
Sodium: 138 mmol/L (ref 135–145)
TCO2: 27 mmol/L (ref 22–32)

## 2018-08-07 LAB — CREATININE, SERUM
CREATININE: 0.76 mg/dL (ref 0.61–1.24)
GFR calc Af Amer: >60 mL/min (ref >60)
GFR calc non Af Amer: >60 mL/min (ref >60)

## 2018-08-07 MED ORDER — ENOXAPARIN SODIUM 40 MG/0.4ML ~~LOC~~ SOLN
40.0000 mg | Freq: Every day | SUBCUTANEOUS | Status: DC
  Administered 2018-08-08 – 2018-08-10 (×3): 40 mg via SUBCUTANEOUS
  Filled 2018-08-07 (×3): qty 0.4

## 2018-08-07 MED ORDER — AMIODARONE LOAD VIA INFUSION
150.0000 mg | Freq: Once | INTRAVENOUS | Status: AC
  Administered 2018-08-07: 150 mg via INTRAVENOUS
  Filled 2018-08-07: qty 83.34

## 2018-08-07 MED ORDER — INSULIN DETEMIR 100 UNIT/ML ~~LOC~~ SOLN
20.0000 [IU] | Freq: Every day | SUBCUTANEOUS | Status: DC
  Administered 2018-08-07: 20 [IU] via SUBCUTANEOUS
  Filled 2018-08-07 (×2): qty 0.2

## 2018-08-07 MED ORDER — AMIODARONE HCL IN DEXTROSE 360-4.14 MG/200ML-% IV SOLN
60.0000 mg/h | INTRAVENOUS | Status: AC
  Administered 2018-08-07 – 2018-08-08 (×2): 60 mg/h via INTRAVENOUS
  Filled 2018-08-07: qty 200

## 2018-08-07 MED ORDER — LISINOPRIL 10 MG PO TABS
10.0000 mg | ORAL_TABLET | Freq: Every day | ORAL | Status: DC
  Administered 2018-08-07: 10 mg via ORAL
  Filled 2018-08-07 (×2): qty 1

## 2018-08-07 MED ORDER — AMIODARONE HCL IN DEXTROSE 360-4.14 MG/200ML-% IV SOLN
30.0000 mg/h | INTRAVENOUS | Status: DC
  Administered 2018-08-08 – 2018-08-09 (×2): 30 mg/h via INTRAVENOUS
  Filled 2018-08-07 (×4): qty 200

## 2018-08-07 MED ORDER — INSULIN ASPART 100 UNIT/ML ~~LOC~~ SOLN
0.0000 [IU] | SUBCUTANEOUS | Status: DC
  Administered 2018-08-07 – 2018-08-08 (×4): 2 [IU] via SUBCUTANEOUS

## 2018-08-07 MED ORDER — FUROSEMIDE 10 MG/ML IJ SOLN
20.0000 mg | Freq: Four times a day (QID) | INTRAMUSCULAR | Status: AC
  Administered 2018-08-07 (×3): 20 mg via INTRAVENOUS
  Filled 2018-08-07 (×3): qty 2

## 2018-08-07 MED FILL — Potassium Chloride Inj 2 mEq/ML: INTRAVENOUS | Qty: 40 | Status: AC

## 2018-08-07 MED FILL — Magnesium Sulfate Inj 50%: INTRAMUSCULAR | Qty: 10 | Status: AC

## 2018-08-07 MED FILL — Heparin Sodium (Porcine) Inj 1000 Unit/ML: INTRAMUSCULAR | Qty: 30 | Status: AC

## 2018-08-07 NOTE — Progress Notes (Addendum)
[address]       [place] [address]             [phone]        CARDIOTHORACIC SURGERY PROGRESS NOTE   R1 Day Post-Op Procedure(s) (LRB): CORONARY ARTERY BYPASS GRAFTING (CABG) times one using right greater saphenous vein harvested endoscopically and aortic valve replacement. (N/A) AORTIC VALVE REPLACEMENT (AVR) (N/A) TRANSESOPHAGEAL ECHOCARDIOGRAM (TEE) (N/A)  Subjective: Looks good.  Mild soreness in chest.  No nausea.  No SOB  Objective: Vital signs: BP Readings from Last 1 Encounters:  08/07/18 126/67   Pulse Readings from Last 1 Encounters:  08/07/18 72   Resp Readings from Last 1 Encounters:  08/07/18 20   Temp Readings from Last 1 Encounters:  08/07/18 98.2 F (36.8 C)    Hemodynamics: PAP: (20-45)/(8-21) 22/11 CO:  [4.3 L/min-8.4 L/min] 7.6 L/min CI:  [2 L/min/m2-3.8 L/min/m2] 3.5 L/min/m2  Physical Exam:  Rhythm:   sinus  Breath sounds: clear  Heart sounds:  RRR  Incisions:  Dressings dry, intact  Abdomen:  Soft, non-distended, non-tender  Extremities:  Warm, well-perfused  Chest tubes:  low volume thin serosanguinous output, no air leak    Intake/Output from previous day: 10/24 0701 - 10/25 0700 In: 4227.9 [I.V.:3158.3; Blood:665; IV Piggyback:404.6] Out: 2525 [Urine:1180; Blood:800; Chest Tube:545] Intake/Output this shift: Total I/O In: 34.7 [I.V.:34.7] Out: -   Lab Results:  CBC: Recent Labs    08/06/18 1850 08/07/18 0347  WBC 16.4* 16.0*  HGB 12.1* 10.8*  HCT 39.1 33.7*  PLT 143* 124*    BMET:  Recent Labs    08/04/18 1343  08/06/18 1823 08/06/18 1850 08/07/18 0347  NA 138   < > 141  --  140  K 3.7   < > 4.5  --  4.2  CL 109   < > 110  --  110  CO2 20*  --   --   --  24  GLUCOSE 105*   < > 134*  --  123*  BUN 15   < > 22  --  19  CREATININE 0.72   < > 0.70 0.72 0.65  CALCIUM 9.1  --   --   --  7.4*   < > = values in this interval not displayed.     PT/INR:   Recent Labs     08/06/18 1301  LABPROT 16.9*  INR 1.39    CBG (last 3)  Recent Labs    08/07/18 0505 08/07/18 0600 08/07/18 0659  GLUCAP 118* 115* 124*    ABG    Component Value Date/Time   PHART 7.299 (L) 08/06/2018 1817   PCO2ART 43.5 08/06/2018 1817   PO2ART 68.0 (L) 08/06/2018 1817   HCO3 21.6 08/06/2018 1817   TCO2 23 08/06/2018 1823   ACIDBASEDEF 5.0 (H) 08/06/2018 1817   O2SAT 92.0 08/06/2018 1817    CXR: Mild bibasilar atelectasis and pulm vasc congestion  EKG: NSR w/out acute ischemic changes, LBBB (new)   Assessment/Plan: S/P Procedure(s) (LRB): CORONARY ARTERY BYPASS GRAFTING (CABG) times one using right greater saphenous vein harvested endoscopically and aortic valve replacement. (N/A) AORTIC VALVE REPLACEMENT (AVR) (N/A) TRANSESOPHAGEAL ECHOCARDIOGRAM (TEE) (N/A)  Doing well POD1 Maintaining NSR w/ stable hemodynamics on NTG drip for hypertension Breathing comfortably w/ O2 sats 92-93% on 3 L/min via nasal cannula, CXR looks good Expected post op acute blood loss anemia, mild Expected post op atelectasis, mild Chronic diastolic CHF with expected post-op volume  excess, weight 4 kg > preop Post op thrombocytopenia, mild LBBB (new)   Mobilize  Diuresis  Watch Hgb, platelet count  Restart lisinopril at reduced dose  Hold beta blocker for now due to new LBBB D/C tubes later today or tomorrow, depending on output   Purcell Nails, MD 08/07/2018 7:45 AM

## 2018-08-07 NOTE — Progress Notes (Signed)
CT surgery p.m. Rounds  Patient had good postop day 1 after AVR CABG Sitting up in chair Chest tube output starting to tail off-removed tubes tomorrow P.m. labs satisfactory

## 2018-08-07 NOTE — Discharge Summary (Signed)
Physician Discharge Summary  Patient ID: Blake Griffin MRN: [id number] DOB/AGE: [birth year] 62 y.o.  Admit date: 08/06/2018 Discharge date: 08/11/2018  Admission Diagnoses:  Patient Active Problem List   Diagnosis Date Noted  . Left bundle branch block (LBBB) 08/06/2018  . Coronary artery disease involving native coronary artery of native heart with angina pectoris (HCC)   . Cigarette smoker 06/29/2018  . Erectile dysfunction 06/29/2018  . Angina pectoris (HCC) 06/29/2018  . Aortic stenosis 06/22/2018  . Hypertension 06/22/2018  . Benign essential HTN 08/28/2017  . COPD (chronic obstructive pulmonary disease) (HCC) 08/28/2017  . Vitamin D deficiency 08/28/2017  . Light headed 08/28/2017   Discharge Diagnoses:   Patient Active Problem List   Diagnosis Date Noted  . S/P CABG x 1 08/06/2018  . S/P aortic valve replacement with bioprosthetic valve + CABG 08/06/2018  . Left bundle branch block (LBBB) 08/06/2018  . Coronary artery disease involving native coronary artery of native heart with angina pectoris (HCC)   . Cigarette smoker 06/29/2018  . Erectile dysfunction 06/29/2018  . Angina pectoris (HCC) 06/29/2018  . Aortic stenosis 06/22/2018  . Hypertension 06/22/2018  . Benign essential HTN 08/28/2017  . COPD (chronic obstructive pulmonary disease) (HCC) 08/28/2017  . Vitamin D deficiency 08/28/2017  . Light headed 08/28/2017   Discharged Condition: good  History of Present Illness:  Blake Griffin is a 62 yo male with history of aortic stenosis, hypertension, hyperlipidemia, and long-standing tobacco abuse.  Patient states that his primary care physician noted a heart murmur on physical exam several years ago. Over the past several months the patient has developed symptoms of exertional shortness of breath and chest tightness. The patient lives a relatively sedentary lifestyle and drives a truck for living. Despite this he has developed increasing symptoms of exertional chest  tightness associated with exertional shortness of breath. He was seen by his primary care physician and referred for cardiology consultation. He was evaluated by Dr. Marva Panda promptly scheduled for transthoracic echocardiogram and diagnostic cardiac catheterization. Echocardiogram performed June 29, 2018 revealed severe, essentially critical, aortic stenosis with preserved left ventricular systolic function. Peak velocity across the aortic valve measured 4.7 m/s corresponding to mean transvalvular gradient estimated 54 mmHg. The DVI was quite low at 0.16 with aortic valve area calculated 0.54 cm. Left ventricular systolic function remains preserved with ejection fraction estimated 60 to 65%. There was moderate left ventricular hypertrophy with significant diastolic dysfunction. Left and right heart catheterization was performed the following day by Dr. Katrinka Blazing. Catheterization confirmed the presence of critical aortic stenosis with peak to peak and mean transvalvular gradients measured 76 and 64 mmHg respectively, corresponding to aortic valve area calculated 0.91 cm. The patient was also noted to have significant single-vessel coronary artery disease with 60 to 75% hazy stenosis of the mid right coronary artery.  The patient was referred to [facility] for surgical evaluation.  He was evaluated by Dr. Cornelius Moras who was in agreement the patient would benefit from coronary bypass grafting and replacement of his aortic valve.  The risks and benefits of the procedure were explained to the patient and he was agreeable to proceed.  Hospital Course:   Blake Griffin presented to [facility] on 08/06/2018.  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  He also underwent endoscopic harvest of greater saphenous vein from his right thigh.  He  tolerated the procedure without difficulty  and was taken to the SICU in stable condition.  He was extubated the evening of surgery.  During his stay in the SICU the patient was weaned off Neo-synephrine and Nitroglycerin as tolerated.  He was restarted on his home Lisinopril which was titrated as tolerated.  His chest tubes and arterial lines were removed without difficulty.  He developed a new onset LBBB and was not started on Lopressor.  The patient developed Atrial Fibrillation.  He was treated with IV Amiodarone with successful conversion to NSR.  He was ambulating independently and transferred to the telemetry unit on 08/09/2018.  The patient continued to make progress.  His pacing wires were removed without difficulty.  He developed bradycardia with rates down into the 30s.  This did occur after administration of oxycodone with associated diaphoresis.  His Amiodarone dose was decreased to 200 mg BID.  He will not be given Oxycodone due to side effects.  He will be given Tramadol instead.  He continues to ambulate without difficulty.  His incisions are healing without evidence of infection.  He is medically stable for discharge home today.   Significant Diagnostic Studies: angiography:    Critical aortic stenosis, calculated valve area 0.91 cm.  Peak to peak gradient 76 mmHg, mean gradient 64 mmHg.  Intermediate stenosis mid RCA in the 60 to 75% range.  Widely patent left coronary system.  Treatments: surgery:    Aortic Valve Replacement             Edwards Intuity Elite Rapid-deployment Stented Bovine Pericardial Tissue Valve (size 25mm, ref # [id number], serial # [id number])   Coronary Artery Bypass Grafting x 1             Saphenous Vein Graft to Distal Right Coronary Artery             Endoscopic Vein Harvest from Right Thigh  Discharge Exam: Blood pressure 121/69, pulse 69, temperature 98.8 F (37.1 C), temperature source Oral, resp. rate 17, height 5\' 11"  (1.803 m), weight 101.9 kg, SpO2 93 %.  General appearance: alert,  cooperative and no distress Heart: regular rate and rhythm Lungs: clear to auscultation bilaterally Abdomen: soft, non-tender; bowel sounds normal; no masses,  no organomegaly Extremities: edema trace Wound: clean and dry  Disposition: Home  Discharge Medications:  The patient has been discharged on:   1.Beta Blocker:  Yes [   ]                              No   [ x  ]                              If No, reason:bradycardia  2.Ace Inhibitor/ARB: Yes [   ]                                     No  [ x   ]                                     If No, reason:  3.Statin:   Yes [ x  ]  No  [   ]                  If No, reason:  4.Ecasa:  Yes  [ x  ]                  No   [   ]                  If No, reason:  Allergies as of 08/11/2018   No Known Allergies     Medication List    STOP taking these medications   HYDROcodone-acetaminophen 5-325 MG tablet Commonly known as:  NORCO/VICODIN   lisinopril-hydrochlorothiazide 20-12.5 MG tablet Commonly known as:  PRINZIDE,ZESTORETIC     TAKE these medications   acetaminophen 500 MG tablet Commonly known as:  TYLENOL Take 2 tablets (1,000 mg total) by mouth every 6 (six) hours as needed.   albuterol 108 (90 Base) MCG/ACT inhaler Commonly known as:  PROVENTIL HFA;VENTOLIN HFA Inhale 2 puffs into the lungs every 6 (six) hours as needed for wheezing or shortness of breath.   amiodarone 200 MG tablet Commonly known as:  PACERONE Take 1 tablet (200 mg total) by mouth 2 (two) times daily after a meal.   aspirin EC 81 MG tablet Take 1 tablet (81 mg total) by mouth daily.   atorvastatin 40 MG tablet Commonly known as:  LIPITOR Take 1 tablet (40 mg total) by mouth daily.   furosemide 40 MG tablet Commonly known as:  LASIX Take 1 tablet (40 mg total) by mouth daily.   potassium chloride SA 20 MEQ tablet Commonly known as:  K-DUR,KLOR-CON Take 1 tablet (20 mEq total) by mouth daily.   traMADol 50 MG  tablet Commonly known as:  ULTRAM Take 1-2 tablets (50-100 mg total) by mouth every 4 (four) hours as needed for moderate pain.   valACYclovir 500 MG tablet Commonly known as:  VALTREX Take 500 mg by mouth daily as needed (cold sores).      Follow-up Information    Purcell Nails, MD Follow up on 09/07/2018.   Specialty:  Cardiothoracic Surgery Why:  Appointment is at 12:30, please get CXR at 12:00 at [facility] located on first floor of our office building Contact information: [address] [place] [address] [phone]        Revankar, Aundra Dubin, MD Follow up on 08/26/2018.   Specialty:  Cardiology Why:  Appointment is at 8:00 Contact information: [address] [place] [address] [phone]           Signed: Lowella Dandy 08/11/2018, 9:22 AM

## 2018-08-07 NOTE — Discharge Instructions (Signed)

## 2018-08-07 NOTE — Progress Notes (Signed)
  Amiodarone Drug - Drug Interaction Consult Note  Recommendations: None, monitor for now  Amiodarone is metabolized by the cytochrome P450 system and therefore has the potential to cause many drug interactions. Amiodarone has an average plasma half-life of 50 days (range 20 to 100 days).   There is potential for drug interactions to occur several weeks or months after stopping treatment and the onset of drug interactions may be slow after initiating amiodarone.   [x]  Statins: Increased risk of myopathy. Simvastatin- restrict dose to 20mg  daily. Other statins: counsel patients to report any muscle pain or weakness immediately.  []  Anticoagulants: Amiodarone can increase anticoagulant effect. Consider warfarin dose reduction. Patients should be monitored closely and the dose of anticoagulant altered accordingly, remembering that amiodarone levels take several weeks to stabilize.  []  Antiepileptics: Amiodarone can increase plasma concentration of phenytoin, the dose should be reduced. Note that small changes in phenytoin dose can result in large changes in levels. Monitor patient and counsel on signs of toxicity.  [x]  Beta blockers: increased risk of bradycardia, AV block and myocardial depression. Sotalol - avoid concomitant use.  []   Calcium channel blockers (diltiazem and verapamil): increased risk of bradycardia, AV block and myocardial depression.  []   Cyclosporine: Amiodarone increases levels of cyclosporine. Reduced dose of cyclosporine is recommended.  []  Digoxin dose should be halved when amiodarone is started.  []  Diuretics: increased risk of cardiotoxicity if hypokalemia occurs.  []  Oral hypoglycemic agents (glyburide, glipizide, glimepiride): increased risk of hypoglycemia. Patient's glucose levels should be monitored closely when initiating amiodarone therapy.   []  Drugs that prolong the QT interval:  Torsades de pointes risk may be increased with concurrent use - avoid if  possible.  Monitor QTc, also keep magnesium/potassium WNL if concurrent therapy can't be avoided. Marland Kitchen Antibiotics: e.g. fluoroquinolones, erythromycin. . Antiarrhythmics: e.g. quinidine, procainamide, disopyramide, sotalol. . Antipsychotics: e.g. phenothiazines, haloperidol.  . Lithium, tricyclic antidepressants, and methadone. Thank You,  Gardner Candle  08/07/2018 8:47 PM

## 2018-08-08 ENCOUNTER — Inpatient Hospital Stay (HOSPITAL_COMMUNITY): Payer: Commercial Managed Care - PPO

## 2018-08-08 LAB — POCT I-STAT, CHEM 8
BUN: 19 mg/dL (ref 8–23)
CHLORIDE: 99 mmol/L (ref 98–111)
Calcium, Ion: 1.1 mmol/L — ABNORMAL LOW (ref 1.15–1.40)
Creatinine, Ser: 0.6 mg/dL — ABNORMAL LOW (ref 0.61–1.24)
Glucose, Bld: 124 mg/dL — ABNORMAL HIGH (ref 70–99)
HEMATOCRIT: 32 % — AB (ref 39.0–52.0)
HEMOGLOBIN: 10.9 g/dL — AB (ref 13.0–17.0)
POTASSIUM: 4.2 mmol/L (ref 3.5–5.1)
Sodium: 135 mmol/L (ref 135–145)
TCO2: 27 mmol/L (ref 22–32)

## 2018-08-08 LAB — CBC
HEMATOCRIT: 34.2 % — AB (ref 39.0–52.0)
Hemoglobin: 10.5 g/dL — ABNORMAL LOW (ref 13.0–17.0)
MCH: 30.5 pg (ref 26.0–34.0)
MCHC: 30.7 g/dL (ref 30.0–36.0)
MCV: 99.4 fL (ref 80.0–100.0)
Platelets: 118 10*3/uL — ABNORMAL LOW (ref 150–400)
RBC: 3.44 MIL/uL — ABNORMAL LOW (ref 4.22–5.81)
RDW: 13.3 % (ref 11.5–15.5)
WBC: 15.4 10*3/uL — ABNORMAL HIGH (ref 4.0–10.5)
nRBC: 0 % (ref 0.0–0.2)

## 2018-08-08 LAB — BASIC METABOLIC PANEL
Anion gap: 6 (ref 5–15)
BUN: 18 mg/dL (ref 8–23)
CALCIUM: 7.7 mg/dL — AB (ref 8.9–10.3)
CHLORIDE: 101 mmol/L (ref 98–111)
CO2: 27 mmol/L (ref 22–32)
CREATININE: 0.8 mg/dL (ref 0.61–1.24)
GFR calc non Af Amer: >60 mL/min (ref >60)
Glucose, Bld: 134 mg/dL — ABNORMAL HIGH (ref 70–99)
Potassium: 3.6 mmol/L (ref 3.5–5.1)
SODIUM: 134 mmol/L — AB (ref 135–145)

## 2018-08-08 LAB — GLUCOSE, CAPILLARY
GLUCOSE-CAPILLARY: 116 mg/dL — AB (ref 70–99)
GLUCOSE-CAPILLARY: 115 mg/dL — AB (ref 70–99)
Glucose-Capillary: 128 mg/dL — ABNORMAL HIGH (ref 70–99)
Glucose-Capillary: 113 mg/dL — ABNORMAL HIGH (ref 70–99)

## 2018-08-08 MED ORDER — INSULIN ASPART 100 UNIT/ML ~~LOC~~ SOLN
0.0000 [IU] | Freq: Three times a day (TID) | SUBCUTANEOUS | Status: DC
  Administered 2018-08-08: 2 [IU] via SUBCUTANEOUS

## 2018-08-08 MED ORDER — POTASSIUM CHLORIDE CRYS ER 20 MEQ PO TBCR
20.0000 meq | EXTENDED_RELEASE_TABLET | ORAL | Status: AC | PRN
  Administered 2018-08-08 – 2018-08-09 (×3): 20 meq via ORAL
  Filled 2018-08-08 (×3): qty 1

## 2018-08-08 MED ORDER — FUROSEMIDE 10 MG/ML IJ SOLN
20.0000 mg | Freq: Every day | INTRAMUSCULAR | Status: DC
  Administered 2018-08-08 – 2018-08-09 (×2): 20 mg via INTRAVENOUS
  Filled 2018-08-08 (×2): qty 2

## 2018-08-08 MED ORDER — DILTIAZEM HCL-DEXTROSE 100-5 MG/100ML-% IV SOLN (PREMIX)
5.0000 mg/h | INTRAVENOUS | Status: DC
  Administered 2018-08-08: 5 mg/h via INTRAVENOUS
  Filled 2018-08-08: qty 100

## 2018-08-08 MED ORDER — POTASSIUM CHLORIDE 10 MEQ/50ML IV SOLN
10.0000 meq | INTRAVENOUS | Status: AC
  Administered 2018-08-08 (×2): 10 meq via INTRAVENOUS
  Filled 2018-08-08: qty 50

## 2018-08-08 MED ORDER — MAGNESIUM SULFATE 2 GM/50ML IV SOLN: 2.0000 g | Freq: Once | INTRAVENOUS | Status: DC

## 2018-08-08 MED ORDER — INSULIN DETEMIR 100 UNIT/ML ~~LOC~~ SOLN
12.0000 [IU] | Freq: Every day | SUBCUTANEOUS | Status: DC
  Administered 2018-08-08 – 2018-08-09 (×2): 12 [IU] via SUBCUTANEOUS
  Filled 2018-08-08 (×2): qty 0.12

## 2018-08-08 MED ORDER — ALBUMIN HUMAN 5 % IV SOLN
12.5000 g | Freq: Once | INTRAVENOUS | Status: AC
  Administered 2018-08-08: 12.5 g via INTRAVENOUS
  Filled 2018-08-08: qty 250

## 2018-08-08 NOTE — Progress Notes (Signed)
2 Days Post-Op Procedure(s) (LRB): CORONARY ARTERY BYPASS GRAFTING (CABG) times one using right greater saphenous vein harvested endoscopically and aortic valve replacement. (N/A) AORTIC VALVE REPLACEMENT (AVR) (N/A) TRANSESOPHAGEAL ECHOCARDIOGRAM (TEE) (N/A) Subjective: afib after AVR CABG chest tubes with min output Objective: Vital signs in last 24 hours: Temp:  [98.4 F (36.9 C)-98.7 F (37.1 C)] 98.4 F (36.9 C) (10/26 0400) Pulse Rate:  [66-132] 132 (10/26 0900) Cardiac Rhythm: Atrial fibrillation (10/26 0800) Resp:  [16-34] 30 (10/26 0900) BP: (59-142)/(45-83) 86/57 (10/26 0900) SpO2:  [92 %-97 %] 92 % (10/26 0900) Arterial Line BP: (158-162)/(51-52) 158/51 (10/25 1000) Weight:  [103 kg] 103 kg (10/26 0452)  Hemodynamic parameters for last 24 hours:  afib  Intake/Output from previous day: 10/25 0701 - 10/26 0700 In: 614.1 [I.V.:414.1; IV Piggyback:200.1] Out: 2195 [Urine:1905; Chest Tube:290] Intake/Output this shift: Total I/O In: 102.2 [I.V.:102.2] Out: -        Exam    General- alert and comfortable    Neck- no JVD, no cervical adenopathy palpable, no carotid bruit   Lungs- clear without rales, wheezes   Cor- regular rate and rhythm, no murmur , gallop   Abdomen- soft, non-tender   Extremities - warm, non-tender, minimal edema   Neuro- oriented, appropriate, no focal weakness   Lab Results: Recent Labs    08/07/18 1628 08/07/18 1629 08/08/18 0505  WBC 15.9*  --  15.4*  HGB 11.4* 11.2* 10.5*  HCT 35.3* 33.0* 34.2*  PLT 129*  --  118*   BMET:  Recent Labs    08/07/18 0347  08/07/18 1629 08/08/18 0505  NA 140  --  138 134*  K 4.2  --  4.0 3.6  CL 110  --  100 101  CO2 24  --   --  27  GLUCOSE 123*  --  125* 134*  BUN 19  --  19 18  CREATININE 0.65   < > 0.80 0.80  CALCIUM 7.4*  --   --  7.7*   < > = values in this interval not displayed.    PT/INR:  Recent Labs    08/06/18 1301  LABPROT 16.9*  INR 1.39   ABG    Component Value  Date/Time   PHART 7.299 (L) 08/06/2018 1817   HCO3 21.6 08/06/2018 1817   TCO2 27 08/07/2018 1629   ACIDBASEDEF 5.0 (H) 08/06/2018 1817   O2SAT 92.0 08/06/2018 1817   CBG (last 3)  Recent Labs    08/07/18 2322 08/08/18 0424 08/08/18 0809  GLUCAP 137* 128* 113*    Assessment/Plan: S/P Procedure(s) (LRB): CORONARY ARTERY BYPASS GRAFTING (CABG) times one using right greater saphenous vein harvested endoscopically and aortic valve replacement. (N/A) AORTIC VALVE REPLACEMENT (AVR) (N/A) TRANSESOPHAGEAL ECHOCARDIOGRAM (TEE) (N/A) Mobilize Diuresis Diabetes control d/c tubes/lines cont iv amiodarone   LOS: 2 days    Kathlee Nations Trigt III 08/08/2018

## 2018-08-08 NOTE — Progress Notes (Signed)
CT surgery p.m. Rounds  Patient converted to sinus rhythm, then became bradycardic IV Cardizem stopped We will continue amiodarone 30 mg an hour IV until tomorrow P.m. labs satisfactory Patient feels much better in sinus rhythm and ready to walk in hallway

## 2018-08-09 ENCOUNTER — Other Ambulatory Visit: Payer: Self-pay

## 2018-08-09 ENCOUNTER — Inpatient Hospital Stay (HOSPITAL_COMMUNITY): Payer: Commercial Managed Care - PPO

## 2018-08-09 LAB — COMPREHENSIVE METABOLIC PANEL
ALT: 17 U/L (ref 0–44)
AST: 17 U/L (ref 15–41)
Albumin: 2.8 g/dL — ABNORMAL LOW (ref 3.5–5.0)
Alkaline Phosphatase: 35 U/L — ABNORMAL LOW (ref 38–126)
Anion gap: 5 (ref 5–15)
BUN: 15 mg/dL (ref 8–23)
CO2: 27 mmol/L (ref 22–32)
Calcium: 7.5 mg/dL — ABNORMAL LOW (ref 8.9–10.3)
Chloride: 103 mmol/L (ref 98–111)
Creatinine, Ser: 0.63 mg/dL (ref 0.61–1.24)
GFR calc Af Amer: >60 mL/min (ref >60)
GFR calc non Af Amer: >60 mL/min (ref >60)
Glucose, Bld: 108 mg/dL — ABNORMAL HIGH (ref 70–99)
Potassium: 3.4 mmol/L — ABNORMAL LOW (ref 3.5–5.1)
Sodium: 135 mmol/L (ref 135–145)
Total Bilirubin: 0.5 mg/dL (ref 0.3–1.2)
Total Protein: 4.7 g/dL — ABNORMAL LOW (ref 6.5–8.1)

## 2018-08-09 LAB — GLUCOSE, CAPILLARY
GLUCOSE-CAPILLARY: 159 mg/dL — AB (ref 70–99)
GLUCOSE-CAPILLARY: 83 mg/dL (ref 70–99)
GLUCOSE-CAPILLARY: 114 mg/dL — AB (ref 70–99)
GLUCOSE-CAPILLARY: 172 mg/dL — AB (ref 70–99)
GLUCOSE-CAPILLARY: 99 mg/dL (ref 70–99)
Glucose-Capillary: 114 mg/dL — ABNORMAL HIGH (ref 70–99)

## 2018-08-09 LAB — CBC
HCT: 28.5 % — ABNORMAL LOW (ref 39.0–52.0)
Hemoglobin: 9.2 g/dL — ABNORMAL LOW (ref 13.0–17.0)
MCH: 31.7 pg (ref 26.0–34.0)
MCHC: 32.3 g/dL (ref 30.0–36.0)
MCV: 98.3 fL (ref 80.0–100.0)
Platelets: 103 10*3/uL — ABNORMAL LOW (ref 150–400)
RBC: 2.9 MIL/uL — ABNORMAL LOW (ref 4.22–5.81)
RDW: 13.2 % (ref 11.5–15.5)
WBC: 9.7 10*3/uL (ref 4.0–10.5)
nRBC: 0 % (ref 0.0–0.2)

## 2018-08-09 MED ORDER — FUROSEMIDE 40 MG PO TABS
40.0000 mg | ORAL_TABLET | Freq: Every day | ORAL | Status: DC
  Administered 2018-08-10 – 2018-08-11 (×2): 40 mg via ORAL
  Filled 2018-08-09 (×2): qty 1

## 2018-08-09 MED ORDER — POTASSIUM CHLORIDE CRYS ER 20 MEQ PO TBCR
20.0000 meq | EXTENDED_RELEASE_TABLET | Freq: Two times a day (BID) | ORAL | Status: DC
  Administered 2018-08-09 – 2018-08-11 (×5): 20 meq via ORAL
  Filled 2018-08-09 (×5): qty 1

## 2018-08-09 MED ORDER — AMIODARONE HCL 200 MG PO TABS
400.0000 mg | ORAL_TABLET | Freq: Every day | ORAL | Status: DC
  Administered 2018-08-09: 400 mg via ORAL
  Filled 2018-08-09: qty 2

## 2018-08-09 MED ORDER — SODIUM CHLORIDE 0.9% FLUSH
3.0000 mL | Freq: Two times a day (BID) | INTRAVENOUS | Status: DC
  Administered 2018-08-09 – 2018-08-11 (×4): 3 mL via INTRAVENOUS

## 2018-08-09 MED ORDER — INSULIN ASPART 100 UNIT/ML ~~LOC~~ SOLN
0.0000 [IU] | Freq: Three times a day (TID) | SUBCUTANEOUS | Status: DC
  Administered 2018-08-09: 4 [IU] via SUBCUTANEOUS
  Administered 2018-08-10: 2 [IU] via SUBCUTANEOUS

## 2018-08-09 MED ORDER — MOVING RIGHT ALONG BOOK
Freq: Once | Status: AC
  Administered 2018-08-09: 17:00:00
  Filled 2018-08-09: qty 1

## 2018-08-09 MED ORDER — SODIUM CHLORIDE 0.9 % IV SOLN: 250.0000 mL | INTRAVENOUS | Status: DC | PRN

## 2018-08-09 MED ORDER — FUROSEMIDE 10 MG/ML IJ SOLN: 40.0000 mg | Freq: Every day | INTRAMUSCULAR | Status: AC

## 2018-08-09 MED ORDER — FUROSEMIDE 10 MG/ML IJ SOLN: 40.0000 mg | Freq: Every day | INTRAMUSCULAR | Status: DC

## 2018-08-09 MED ORDER — SODIUM CHLORIDE 0.9% FLUSH: 3.0000 mL | INTRAVENOUS | Status: DC | PRN

## 2018-08-09 NOTE — Progress Notes (Signed)
Report called to RN [phone]. Patient with no complaints at the current time. Will transfer via [facility] w/ RN.

## 2018-08-09 NOTE — Progress Notes (Signed)
3 Days Post-Op Procedure(s) (LRB): CORONARY ARTERY BYPASS GRAFTING (CABG) times one using right greater saphenous vein harvested endoscopically and aortic valve replacement. (N/A) AORTIC VALVE REPLACEMENT (AVR) (N/A) TRANSESOPHAGEAL ECHOCARDIOGRAM (TEE) (N/A) Subjective: Patient continues to feel stronger after conversion to sinus rhythm Plan on transition to oral amiodarone Plan transfer to stepdown today  Objective: Vital signs in last 24 hours: Temp:  [98 F (36.7 C)-99 F (37.2 C)] 98.8 F (37.1 C) (10/27 0745) Pulse Rate:  [41-75] 70 (10/27 0800) Cardiac Rhythm: Atrial paced (10/27 0800) Resp:  [13-29] 21 (10/27 0800) BP: (108-127)/(40-65) 120/53 (10/27 0800) SpO2:  [93 %-97 %] 96 % (10/27 0800) Weight:  [100.9 kg] 100.9 kg (10/27 0500)  Hemodynamic parameters for last 24 hours:    Intake/Output from previous day: 10/26 0701 - 10/27 0700 In: 1197.8 [P.O.:360; I.V.:837.8] Out: 371 [Urine:351; Chest Tube:20] Intake/Output this shift: Total I/O In: 266.6 [P.O.:240; I.V.:26.6] Out: -        Exam    General- alert and comfortable    Neck- no JVD, no cervical adenopathy palpable, no carotid bruit   Lungs- clear without rales, wheezes   Cor- regular rate and rhythm, no murmur , gallop   Abdomen- soft, non-tender   Extremities - warm, non-tender, minimal edema   Neuro- oriented, appropriate, no focal weakness   Lab Results: Recent Labs    08/08/18 0505 08/08/18 1704 08/09/18 0414  WBC 15.4*  --  9.7  HGB 10.5* 10.9* 9.2*  HCT 34.2* 32.0* 28.5*  PLT 118*  --  103*   BMET:  Recent Labs    08/08/18 0505 08/08/18 1704 08/09/18 0414  NA 134* 135 135  K 3.6 4.2 3.4*  CL 101 99 103  CO2 27  --  27  GLUCOSE 134* 124* 108*  BUN 18 19 15   CREATININE 0.80 0.60* 0.63  CALCIUM 7.7*  --  7.5*    PT/INR:  Recent Labs    08/06/18 1301  LABPROT 16.9*  INR 1.39   ABG    Component Value Date/Time   PHART 7.299 (L) 08/06/2018 1817   HCO3 21.6 08/06/2018  1817   TCO2 27 08/08/2018 1704   ACIDBASEDEF 5.0 (H) 08/06/2018 1817   O2SAT 92.0 08/06/2018 1817   CBG (last 3)  Recent Labs    08/08/18 2059 08/09/18 0306 08/09/18 0740  GLUCAP 159* 83 114*    Assessment/Plan: S/P Procedure(s) (LRB): CORONARY ARTERY BYPASS GRAFTING (CABG) times one using right greater saphenous vein harvested endoscopically and aortic valve replacement. (N/A) AORTIC VALVE REPLACEMENT (AVR) (N/A) TRANSESOPHAGEAL ECHOCARDIOGRAM (TEE) (N/A) Mobilize Diuresis Plan for transfer to step-down: see transfer orders Roll and tape pacing wires   LOS: 3 days    Blake Griffin 08/09/2018

## 2018-08-10 ENCOUNTER — Inpatient Hospital Stay (HOSPITAL_COMMUNITY): Payer: Commercial Managed Care - PPO

## 2018-08-10 LAB — CBC
HCT: 30.7 % — ABNORMAL LOW (ref 39.0–52.0)
Hemoglobin: 10 g/dL — ABNORMAL LOW (ref 13.0–17.0)
MCH: 31.6 pg (ref 26.0–34.0)
MCHC: 32.6 g/dL (ref 30.0–36.0)
MCV: 97.2 fL (ref 80.0–100.0)
Platelets: 144 10*3/uL — ABNORMAL LOW (ref 150–400)
RBC: 3.16 MIL/uL — ABNORMAL LOW (ref 4.22–5.81)
RDW: 13.2 % (ref 11.5–15.5)
WBC: 10.9 10*3/uL — ABNORMAL HIGH (ref 4.0–10.5)
nRBC: 0 % (ref 0.0–0.2)

## 2018-08-10 LAB — BASIC METABOLIC PANEL
Anion gap: 4 — ABNORMAL LOW (ref 5–15)
BUN: 17 mg/dL (ref 8–23)
CO2: 28 mmol/L (ref 22–32)
Calcium: 8.1 mg/dL — ABNORMAL LOW (ref 8.9–10.3)
Chloride: 103 mmol/L (ref 98–111)
Creatinine, Ser: 0.71 mg/dL (ref 0.61–1.24)
GFR calc Af Amer: >60 mL/min (ref >60)
GFR calc non Af Amer: >60 mL/min (ref >60)
Glucose, Bld: 115 mg/dL — ABNORMAL HIGH (ref 70–99)
Potassium: 3.6 mmol/L (ref 3.5–5.1)
Sodium: 135 mmol/L (ref 135–145)

## 2018-08-10 LAB — GLUCOSE, CAPILLARY
Glucose-Capillary: 122 mg/dL — ABNORMAL HIGH (ref 70–99)
Glucose-Capillary: 103 mg/dL — ABNORMAL HIGH (ref 70–99)
Glucose-Capillary: 113 mg/dL — ABNORMAL HIGH (ref 70–99)

## 2018-08-10 MED ORDER — AMIODARONE HCL 200 MG PO TABS
200.0000 mg | ORAL_TABLET | Freq: Two times a day (BID) | ORAL | Status: DC
  Administered 2018-08-11: 200 mg via ORAL
  Filled 2018-08-10: qty 1

## 2018-08-10 MED ORDER — POTASSIUM CHLORIDE CRYS ER 20 MEQ PO TBCR
40.0000 meq | EXTENDED_RELEASE_TABLET | Freq: Once | ORAL | Status: AC
  Administered 2018-08-10: 40 meq via ORAL
  Filled 2018-08-10: qty 2

## 2018-08-10 MED ORDER — AMIODARONE HCL 200 MG PO TABS
400.0000 mg | ORAL_TABLET | Freq: Two times a day (BID) | ORAL | Status: AC
  Administered 2018-08-10 (×2): 400 mg via ORAL
  Filled 2018-08-10 (×2): qty 2

## 2018-08-10 NOTE — Progress Notes (Signed)
Patient education completed for removal of pacer wires, patient verbalized understanding, procedure well tolerated, patient now on bedrest for one hour, call bell within reach will monitor.

## 2018-08-10 NOTE — Plan of Care (Signed)
  Problem: Education: Goal: Knowledge of disease or condition will improve Outcome: Progressing   Problem: Activity: Goal: Risk for activity intolerance will decrease Outcome: Progressing   Problem: Respiratory: Goal: Respiratory status will improve Outcome: Progressing   Problem: Skin Integrity: Goal: Wound healing without signs and symptoms of infection Outcome: Progressing   Problem: Nutrition: Goal: Adequate nutrition will be maintained Outcome: Progressing   Problem: Coping: Goal: Level of anxiety will decrease Outcome: Progressing   Problem: Elimination: Goal: Will not experience complications related to bowel motility Outcome: Progressing Goal: Will not experience complications related to urinary retention Outcome: Progressing   Problem: Skin Integrity: Goal: Risk for impaired skin integrity will decrease Outcome: Progressing

## 2018-08-10 NOTE — Progress Notes (Addendum)
      [address]       [place] [address]             [phone]      4 Days Post-Op Procedure(s) (LRB): CORONARY ARTERY BYPASS GRAFTING (CABG) times one using right greater saphenous vein harvested endoscopically and aortic valve replacement. (N/A) AORTIC VALVE REPLACEMENT (AVR) (N/A) TRANSESOPHAGEAL ECHOCARDIOGRAM (TEE) (N/A)   Subjective:   No new complaints.  + ambulation  + flatus  Objective: Vital signs in last 24 hours: Temp:  [98.5 F (36.9 C)-100.2 F (37.9 C)] 98.6 F (37 C) (10/28 0713) Pulse Rate:  [59-71] 59 (10/28 0713) Cardiac Rhythm: Normal sinus rhythm;Heart block;Bundle branch block (10/28 0701) Resp:  [15-26] 20 (10/28 0713) BP: (90-133)/(53-80) 117/55 (10/28 0713) SpO2:  [90 %-97 %] 90 % (10/28 0713) Weight:  [102.1 kg] 102.1 kg (10/28 0427)  Intake/Output from previous day: 10/27 0701 - 10/28 0700 In: 705.5 [P.O.:600; I.V.:105.5] Out: 2 [Urine:2]  General appearance: alert, cooperative and no distress Heart: regular rate and rhythm Lungs: clear to auscultation bilaterally Abdomen: soft, non-tender; bowel sounds normal; no masses,  no organomegaly Extremities: edema trace Wound: clean and dry  Lab Results: Recent Labs    08/09/18 0414 08/10/18 0439  WBC 9.7 10.9*  HGB 9.2* 10.0*  HCT 28.5* 30.7*  PLT 103* 144*   BMET:  Recent Labs    08/09/18 0414 08/10/18 0439  NA 135 135  K 3.4* 3.6  CL 103 103  CO2 27 28  GLUCOSE 108* 115*  BUN 15 17  CREATININE 0.63 0.71  CALCIUM 7.5* 8.1*    PT/INR: No results for input(s): LABPROT, INR in the last 72 hours. ABG    Component Value Date/Time   PHART 7.299 (L) 08/06/2018 1817   HCO3 21.6 08/06/2018 1817   TCO2 27 08/08/2018 1704   ACIDBASEDEF 5.0 (H) 08/06/2018 1817   O2SAT 92.0 08/06/2018 1817   CBG (last 3)  Recent Labs    08/09/18 1539 08/09/18 2107 08/10/18 0613  GLUCAP 172* 99 122*    Assessment/Plan: S/P Procedure(s) (LRB): CORONARY ARTERY BYPASS  GRAFTING (CABG) times one using right greater saphenous vein harvested endoscopically and aortic valve replacement. (N/A) AORTIC VALVE REPLACEMENT (AVR) (N/A) TRANSESOPHAGEAL ECHOCARDIOGRAM (TEE) (N/A)  1. CV- PAF, currently Sinus Bradycardia with LBBB- on Amiodarone 2. Pulm- no acute issues, off oxygen continue IS 3. Renal- creatinine WNL, weight up about 3 lbs, on oral Lasix 4. CBGs controlled, not a diabetic, will d/c SSIP 5. Dispo- patient stable, some A. Fib on oral Amiodarone, currently Sinus Bradycardia with LBBB, will d/c EPW, if remains stable, will plan to d/c in AM   LOS: 4 days    Erin Barrett 08/10/2018   I have seen and examined the patient and agree with the assessment and plan as outlined.  Tentatively plan d/c home tomorrow if no further PAF  Purcell Nails, MD 08/10/2018 8:19 AM

## 2018-08-10 NOTE — Progress Notes (Signed)
CARDIAC REHAB PHASE I   PRE:  Rate/Rhythm: 63 SR with PACs  BP:  Sitting: 99/82        SaO2: 92 RA  MODE:  Ambulation: 470 ft   POST:  Rate/Rhythm: 84 SR with PACs and PVCs  BP:  Sitting: 158/62        SaO2: 94 RA  0955 - 1105  Pt ambulated 470 ft with a walker and assistance x1. Tolerated well. Gait was slow and steady. Ed completed on sternal precautions, activity restrictions, diet (HH), exercise guidelines, and smoking cessation. Discussed watching carbs with pt. Pt reports he is trying to quit smoking upon returning home. Pt given a fake cigarette. Pt interested in [facility] and will send a letter of interest to [place].   Alisia Ferrari, MS 08/10/2018 11:01 AM

## 2018-08-10 NOTE — Progress Notes (Signed)
Patient ambulated twice in the hall on this shift, ambulation well tolerated, patient educated on the need for him to walk one more time in the hall tonight he verbalized understanding, will monitor.

## 2018-08-11 MED ORDER — AMIODARONE HCL 200 MG PO TABS: 200.0000 mg | ORAL_TABLET | Freq: Two times a day (BID) | ORAL | 1 refills | Status: DC

## 2018-08-11 MED ORDER — ACETAMINOPHEN 500 MG PO TABS: 1000.0000 mg | ORAL_TABLET | Freq: Four times a day (QID) | ORAL | 0 refills | Status: DC | PRN

## 2018-08-11 MED ORDER — TRAMADOL HCL 50 MG PO TABS: 50.0000 mg | ORAL_TABLET | ORAL | 0 refills | Status: DC | PRN

## 2018-08-11 MED ORDER — FUROSEMIDE 40 MG PO TABS: 40.0000 mg | ORAL_TABLET | Freq: Every day | ORAL | 0 refills | Status: DC

## 2018-08-11 MED ORDER — POTASSIUM CHLORIDE CRYS ER 20 MEQ PO TBCR: 20.0000 meq | EXTENDED_RELEASE_TABLET | Freq: Every day | ORAL | 0 refills | Status: DC

## 2018-08-11 MED FILL — Mannitol IV Soln 20%: INTRAVENOUS | Qty: 1000 | Status: AC

## 2018-08-11 MED FILL — Lidocaine HCl(Cardiac) IV PF Soln Pref Syr 100 MG/5ML (2%): INTRAVENOUS | Qty: 25 | Status: AC

## 2018-08-11 MED FILL — Sodium Chloride IV Soln 0.9%: INTRAVENOUS | Qty: 2000 | Status: AC

## 2018-08-11 MED FILL — Sodium Bicarbonate IV Soln 8.4%: INTRAVENOUS | Qty: 50 | Status: AC

## 2018-08-11 MED FILL — Heparin Sodium (Porcine) Inj 1000 Unit/ML: INTRAMUSCULAR | Qty: 10 | Status: AC

## 2018-08-11 MED FILL — Electrolyte-R (PH 7.4) Solution: INTRAVENOUS | Qty: 4000 | Status: AC

## 2018-08-11 NOTE — Progress Notes (Addendum)
      [address]       [place] [address]             [phone]      5 Days Post-Op Procedure(s) (LRB): CORONARY ARTERY BYPASS GRAFTING (CABG) times one using right greater saphenous vein harvested endoscopically and aortic valve replacement. (N/A) AORTIC VALVE REPLACEMENT (AVR) (N/A) TRANSESOPHAGEAL ECHOCARDIOGRAM (TEE) (N/A)   Subjective:  No new complaints.  Some episodes of Bradycardia into the 30-40s overnight.  He had just taken oxycodone as well with development of diaphoresis.  Patient asymptomatic during event.  + ambulation  + BM  Objective: Vital signs in last 24 hours: Temp:  [98.8 F (37.1 C)-99.6 F (37.6 C)] 98.8 F (37.1 C) (10/29 0540) Pulse Rate:  [53-77] 69 (10/29 0540) Cardiac Rhythm: Normal sinus rhythm;Bundle branch block (10/29 0700) Resp:  [10-26] 26 (10/29 0540) BP: (109-157)/(57-72) 121/69 (10/29 0540) SpO2:  [91 %-98 %] 93 % (10/29 0540) Weight:  [101.9 kg] 101.9 kg (10/29 0540)  Intake/Output from previous day: 10/28 0701 - 10/29 0700 In: 240 [P.O.:240] Out: -   General appearance: alert, cooperative and no distress Heart: regular rate and rhythm Lungs: clear to auscultation bilaterally Abdomen: soft, non-tender; bowel sounds normal; no masses,  no organomegaly Extremities: edema trace Wound: clean and dry  Lab Results: Recent Labs    08/09/18 0414 08/10/18 0439  WBC 9.7 10.9*  HGB 9.2* 10.0*  HCT 28.5* 30.7*  PLT 103* 144*   BMET:  Recent Labs    08/09/18 0414 08/10/18 0439  NA 135 135  K 3.4* 3.6  CL 103 103  CO2 27 28  GLUCOSE 108* 115*  BUN 15 17  CREATININE 0.63 0.71  CALCIUM 7.5* 8.1*    PT/INR: No results for input(s): LABPROT, INR in the last 72 hours. ABG    Component Value Date/Time   PHART 7.299 (L) 08/06/2018 1817   HCO3 21.6 08/06/2018 1817   TCO2 27 08/08/2018 1704   ACIDBASEDEF 5.0 (H) 08/06/2018 1817   O2SAT 92.0 08/06/2018 1817   CBG (last 3)  Recent Labs    08/10/18 0613  08/10/18 1114 08/10/18 1712  GLUCAP 122* 103* 113*    Assessment/Plan: S/P Procedure(s) (LRB): CORONARY ARTERY BYPASS GRAFTING (CABG) times one using right greater saphenous vein harvested endoscopically and aortic valve replacement. (N/A) AORTIC VALVE REPLACEMENT (AVR) (N/A) TRANSESOPHAGEAL ECHOCARDIOGRAM (TEE) (N/A)  1.  CV- Sinus Bradycardia, dropped into 30s overnight, patient is asymptomatic- Amiodarone has been decreased to 200 mg BID, no lopressor ordered 2. Pulm- no acute issues, continue IS 3. Renal- creatinine WNL, weight is trending down, continue Lasix 4. Dispo- patient stable, some bradycardia overnight, patient asymptomatic, Amiodarone has been decreased to 200 mg BID starting today, will d/c home today if okay with Dr. Cornelius Moras   LOS: 5 days    Lowella Dandy 08/11/2018   I have seen and examined the patient and agree with the assessment and plan as outlined.  D/C home  Purcell Nails, MD 08/11/2018 8:30 AM

## 2018-08-11 NOTE — Progress Notes (Signed)
CARDIAC REHAB PHASE I   Pt for d/c today, pt and family deny questions or concerns. Letter of interest send to [facility] [place].  [phone] Reynold Bowen, RN BSN 08/11/2018 10:11 AM

## 2018-08-11 NOTE — Progress Notes (Signed)
Patient's HR dropped to 39 but did not sustain. Intermittently patient's HR will drop to the 40's but does not sustain. Patient is asymptomatic. Denies SOB, CP, lightheadedness/dizziness. VSS. Patient c/o sweating/chilled. Temp 99. Encouraged patient to use incentive spirometer.   Paged on call Cornelius Moras) x 2. Awaiting call back.

## 2018-08-11 NOTE — Progress Notes (Signed)
08/11/2018 10:41 AM Discharge AVS meds taken today and those due this evening reviewed.  Follow-up appointments and when to call md reviewed.  D/C IV and TELE.  Questions and concerns addressed.   D/C home per orders.  Kathryne Hitch

## 2018-08-17 ENCOUNTER — Other Ambulatory Visit: Payer: Self-pay | Admitting: Physician Assistant

## 2018-08-18 ENCOUNTER — Other Ambulatory Visit: Payer: Self-pay | Admitting: Physician Assistant

## 2018-08-18 ENCOUNTER — Telehealth: Payer: Self-pay

## 2018-08-18 MED ORDER — TRAMADOL HCL 50 MG PO TABS: 50.0000 mg | ORAL_TABLET | Freq: Four times a day (QID) | ORAL | 0 refills | Status: DC | PRN

## 2018-08-18 NOTE — Telephone Encounter (Signed)
Mr. Luhman called the office with a request about a refill on Tramadol.  Patient is s/p CABG 08/06/18 and stated he is only using about 1-2 tablets daily.  He states that the Tramadol is helping with his pain.  I advised him that this would probably be the last refill given, which he acknowledged receipt.  Tessa, PA to give refill.  He also stated that when getting out of the truck he felt like he pulled a muscle in his back.  I advised him to use heat and ice compresses to his back to see if that would help.  He stated that he has been using heat and it has helped some, which is probably musculoskeletal.  Advised patient to give it time.  He acknowledged receipt.

## 2018-08-26 ENCOUNTER — Encounter: Payer: Self-pay | Admitting: Cardiology

## 2018-08-26 ENCOUNTER — Ambulatory Visit (INDEPENDENT_AMBULATORY_CARE_PROVIDER_SITE_OTHER): Payer: Commercial Managed Care - PPO | Admitting: Cardiology

## 2018-08-26 VITALS — BP 118/66 | HR 73 | Ht 71.0 in | Wt 221.0 lb

## 2018-08-26 DIAGNOSIS — I447 Left bundle-branch block, unspecified: Secondary | ICD-10-CM | POA: Diagnosis not present

## 2018-08-26 DIAGNOSIS — Z951 Presence of aortocoronary bypass graft: Secondary | ICD-10-CM

## 2018-08-26 DIAGNOSIS — J431 Panlobular emphysema: Secondary | ICD-10-CM

## 2018-08-26 DIAGNOSIS — I10 Essential (primary) hypertension: Secondary | ICD-10-CM | POA: Diagnosis not present

## 2018-08-26 DIAGNOSIS — F17210 Nicotine dependence, cigarettes, uncomplicated: Secondary | ICD-10-CM

## 2018-08-26 DIAGNOSIS — Z953 Presence of xenogenic heart valve: Secondary | ICD-10-CM | POA: Diagnosis not present

## 2018-08-26 DIAGNOSIS — I25119 Atherosclerotic heart disease of native coronary artery with unspecified angina pectoris: Secondary | ICD-10-CM | POA: Diagnosis not present

## 2018-08-26 LAB — BASIC METABOLIC PANEL
BUN / CREAT RATIO: 18 (ref 10–24)
BUN: 15 mg/dL (ref 8–27)
CO2: 23 mmol/L (ref 20–29)
CREATININE: 0.84 mg/dL (ref 0.76–1.27)
Calcium: 9 mg/dL (ref 8.6–10.2)
Chloride: 102 mmol/L (ref 96–106)
GFR calc Af Amer: 107 mL/min/{1.73_m2} (ref >59)
GFR, EST NON AFRICAN AMERICAN: 93 mL/min/{1.73_m2} (ref >59)
GLUCOSE: 97 mg/dL (ref 65–99)
Potassium: 4.6 mmol/L (ref 3.5–5.2)
SODIUM: 140 mmol/L (ref 134–144)

## 2018-08-26 NOTE — Progress Notes (Signed)
Cardiology Office Note:    Date:  08/26/2018   ID:  Blake Griffin, DOB Jun 12, 1956, MRN [id number]  PCP:  Wilmer Floorampbell, Stephen D., MD  Cardiologist:  Garwin Brothersajan R Revankar, MD   Referring MD: Wilmer Floorampbell, Stephen D., MD    ASSESSMENT:    1. S/P aortic valve replacement with bioprosthetic valve + CABG   2. Left bundle branch block (LBBB)   3. Essential hypertension   4. Coronary artery disease involving native coronary artery of native heart with angina pectoris (HCC)   5. Benign essential HTN   6. S/P CABG x 1   7. Panlobular emphysema (HCC)   8. Cigarette smoker    PLAN:    In order of problems listed above:  1. Secondary prevention stressed with the patient.  Importance of compliance with diet and medication stressed and he vocalized understanding.  His blood pressure is stable. 2. Diet was discussed for dyslipidemia and patient is on statin therapy.  He is also on amiodarone therapy and he is going to see his surgeon on the 25th and discuss discontinuation of that medication.  Patient has stopped furosemide and I have asked him to stop his potassium supplementation also.  He will have a Chem-7 today he will be back in a month for liver lipid check. 3. Patient will be seen in follow-up appointment in 3 months or earlier if the patient has any concerns 4. I congratulated him on his smoking cessation and told him never to go back to smoking and he agrees and promises me not to smoke.   Medication Adjustments/Labs and Tests Ordered: Current medicines are reviewed at length with the patient today.  Concerns regarding medicines are outlined above.  No orders of the defined types were placed in this encounter.  No orders of the defined types were placed in this encounter.    No chief complaint on file.    History of Present Illness:    Blake Griffin is a 62 y.o. male patient has aortic stenosis and was evaluated by me.  He had significant right coronary artery stenosis also.  Because of  the above he underwent bovine bioprosthetic aortic valve replacement and bypass of the right coronary artery with a vein graft.  Subsequently is done fine.  No chest pain orthopnea or PND.  He is walking on a regular basis and tells me that he feels amazingly well.  Also since my last evaluation he is quit smoking.  At the time of my evaluation, the patient is alert awake oriented and in no distress.  Past Medical History:  Diagnosis Date  . Anal fissure   . Aortic stenosis   . Bilateral carotid bruits   . COPD (chronic obstructive pulmonary disease) (HCC)   . Coronary artery disease involving native coronary artery of native heart with angina pectoris (HCC)   . Dyslipidemia   . ED (erectile dysfunction)   . Hemorrhoids   . Hypertension   . Hypogonadism in male   . Left bundle branch block (LBBB) 08/06/2018  . Leg cramps   . Pain of left shoulder region   . Petechiae   . Pneumonia 2016  . S/P AVR (aortic valve replacement)  08/06/2018   25 mm Edwards Intuity Elite stented bovine pericardial bioprosthetic tissue valve  [id number] Model: 8300AB  . 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 Smoking   . Vitamin D deficiency     Past Surgical History:  Procedure Laterality Date  . AORTIC VALVE REPLACEMENT N/A 08/06/2018   Procedure: AORTIC VALVE REPLACEMENT (AVR);  Surgeon: Purcell Nails, MD;  Location: [facility] OR;  Service: Open Heart Surgery;  Laterality: N/A;  . APPENDECTOMY    . COLONOSCOPY W/ POLYPECTOMY     x 2  . CORONARY ARTERY BYPASS GRAFT N/A 08/06/2018   Procedure: CORONARY ARTERY BYPASS GRAFTING (CABG) times one using right greater saphenous vein harvested endoscopically and aortic valve replacement.;  Surgeon: Purcell Nails, MD;  Location: [facility] OR;  Service: Open Heart Surgery;  Laterality: N/A;  . Lung Biospy Right    lower lung  . RIGHT/LEFT HEART CATH AND CORONARY ANGIOGRAPHY N/A 06/30/2018   Procedure: RIGHT/LEFT HEART CATH AND CORONARY ANGIOGRAPHY;   Surgeon: Lyn Records, MD;  Location: [facility] INVASIVE CV LAB;  Service: Cardiovascular;  Laterality: N/A;  . TEE WITHOUT CARDIOVERSION N/A 08/06/2018   Procedure: TRANSESOPHAGEAL ECHOCARDIOGRAM (TEE);  Surgeon: Purcell Nails, MD;  Location: [facility] OR;  Service: Open Heart Surgery;  Laterality: N/A;  . TOOTH EXTRACTION N/A 07/30/2018   Procedure: Extraction of tooth #5 w/ Alveoloplasty and Gross debridement of remaining dentition.;  Surgeon: Charlynne Pander, DDS;  Location: [facility] OR;  Service: Oral Surgery;  Laterality: N/A;    Current Medications: Current Meds  Medication Sig  . acetaminophen (TYLENOL) 500 MG tablet Take 2 tablets (1,000 mg total) by mouth every 6 (six) hours as needed.  Marland Kitchen albuterol (PROVENTIL HFA;VENTOLIN HFA) 108 (90 Base) MCG/ACT inhaler Inhale 2 puffs into the lungs every 6 (six) hours as needed for wheezing or shortness of breath.   Marland Kitchen amiodarone (PACERONE) 200 MG tablet Take 1 tablet (200 mg total) by mouth 2 (two) times daily after a meal.  . aspirin EC 81 MG tablet Take 1 tablet (81 mg total) by mouth daily.  Marland Kitchen atorvastatin (LIPITOR) 40 MG tablet Take 1 tablet (40 mg total) by mouth daily.  . furosemide (LASIX) 40 MG tablet Take 1 tablet (40 mg total) by mouth daily.  . potassium chloride SA (K-DUR,KLOR-CON) 20 MEQ tablet Take 1 tablet (20 mEq total) by mouth daily.  . traMADol (ULTRAM) 50 MG tablet Take 1 tablet (50 mg total) by mouth every 6 (six) hours as needed for moderate pain.  . valACYclovir (VALTREX) 500 MG tablet Take 500 mg by mouth daily as needed (cold sores).      Allergies:   Patient has no known allergies.   Social History   Socioeconomic History  . Marital status: Married    Spouse name: Not on file  . Number of children: 5  . Years of education: Not on file  . Highest education level: Not on file  Occupational History  . Not on file  Social Needs  . Financial resource strain: Not on file  . Food insecurity:    Worry: Not on file     Inability: Not on file  . Transportation needs:    Medical: Not on file    Non-medical: Not on file  Tobacco Use  . Smoking status: Current Every Day Smoker    Packs/day: 1.00    Years: 50.00    Pack years: 50.00  . Smokeless tobacco: Never Used  Substance and Sexual Activity  . Alcohol use: Yes    Alcohol/week: 14.0 standard drinks    Types: 14 Shots of liquor per week    Comment: Bacardi rum daily  . Drug use: Never  . Sexual activity: Not on file  Lifestyle  .  Physical activity:    Days per week: Not on file    Minutes per session: Not on file  . Stress: Not on file  Relationships  . Social connections:    Talks on phone: Not on file    Gets together: Not on file    Attends religious service: Not on file    Active member of club or organization: Not on file    Attends meetings of clubs or organizations: Not on file    Relationship status: Not on file  Other Topics Concern  . Not on file  Social History Narrative  . Not on file     Family History: The patient's family history includes Coronary artery disease in his mother; Hypertension in his father and mother.  ROS:   Please see the history of present illness.    All other systems reviewed and are negative.  EKGs/Labs/Other Studies Reviewed:    The following studies were reviewed today: [facility]  CARDIAC CATHETERIZATION  Order# [id number]  Reading physician: Lyn Records, MD Ordering physician: Lyn Records, MD Study date: 06/30/18  Physicians   Panel Physicians Referring Physician Case Authorizing Physician  Lyn Records, MD (Primary)    Procedures   RIGHT/LEFT HEART CATH AND CORONARY ANGIOGRAPHY  Conclusion    Critical aortic stenosis, calculated valve area 0.91 cm.  Peak to peak gradient 76 mmHg, mean gradient 64 mmHg.  Intermediate stenosis mid RCA in the 60 to 75% range.  Widely patent left coronary system.  RECOMMENDATIONS:   Severe aortic stenosis documented by echo and  invasive evaluation.     Recent Labs: 06/29/2018: TSH 1.090 08/07/2018: Magnesium 2.7 08/09/2018: ALT 17 08/10/2018: BUN 17; Creatinine, Ser 0.71; Hemoglobin 10.0; Platelets 144; Potassium 3.6; Sodium 135  Recent Lipid Panel    Component Value Date/Time   CHOL 139 06/29/2018 0938   TRIG 173 (H) 06/29/2018 0938   HDL 38 (L) 06/29/2018 0938   CHOLHDL 3.7 06/29/2018 0938   LDLCALC 66 06/29/2018 0938    Physical Exam:    VS:  BP 118/66 (BP Location: Right Arm, Patient Position: Sitting, Cuff Size: Normal)   Pulse 73   Ht 5\' 11"  (1.803 m)   Wt 221 lb (100.2 kg)   SpO2 95%   BMI 30.82 kg/m     Wt Readings from Last 3 Encounters:  08/26/18 221 lb (100.2 kg)  08/11/18 224 lb 9.6 oz (101.9 kg)  08/04/18 222 lb (100.7 kg)     GEN: Patient is in no acute distress HEENT: Normal NECK: No JVD; No carotid bruits LYMPHATICS: No lymphadenopathy CARDIAC: Hear sounds regular, 2/6 systolic murmur at the apex. RESPIRATORY:  Clear to auscultation without rales, wheezing or rhonchi  ABDOMEN: Soft, non-tender, non-distended MUSCULOSKELETAL:  No edema; No deformity  SKIN: Warm and dry NEUROLOGIC:  Alert and oriented x 3 PSYCHIATRIC:  Normal affect   Signed, Garwin Brothers, MD  08/26/2018 8:26 AM    [facility]

## 2018-08-26 NOTE — Patient Instructions (Signed)
Medication Instructions:  Your physician recommends that you continue on your current medications as directed. Please refer to the Current Medication list given to you today.  If you need a refill on your cardiac medications before your next appointment, please call your pharmacy.   Lab work: Your physician recommends that you have the following labs drawn: BMP to be done today.  BMP, liver and lipid panel to be done in 1 month, please come fasting. No appointment is needed for labs.  If you have labs (blood work) drawn today and your tests are completely normal, you will receive your results only by: Marland Kitchen. MyChart Message (if you have MyChart) OR . A paper copy in the mail If you have any lab test that is abnormal or we need to change your treatment, we will call you to review the results.  Testing/Procedures: None  Follow-Up: At [facility], you and your health needs are our priority.  As part of our continuing mission to provide you with exceptional heart care, we have created designated Provider Care Teams.  These Care Teams include your primary Cardiologist (physician) and Advanced Practice Providers (APPs -  Physician Assistants and Nurse Practitioners) who all work together to provide you with the care you need, when you need it.  You will need a follow up appointment in 3 months.  Please call our office 2 months in advance to schedule this appointment.  You may see another member of our [facility] Provider Team in [place]: Gypsy Balsamobert Krasowski, MD . Norman HerrlichBrian Munley, MD  Any Other Special Instructions Will Be Listed Below (If Applicable).

## 2018-09-04 ENCOUNTER — Other Ambulatory Visit: Payer: Self-pay | Admitting: Thoracic Surgery (Cardiothoracic Vascular Surgery)

## 2018-09-04 DIAGNOSIS — Z953 Presence of xenogenic heart valve: Secondary | ICD-10-CM

## 2018-09-07 ENCOUNTER — Ambulatory Visit: Payer: Self-pay | Admitting: Thoracic Surgery (Cardiothoracic Vascular Surgery)

## 2018-09-08 ENCOUNTER — Ambulatory Visit (INDEPENDENT_AMBULATORY_CARE_PROVIDER_SITE_OTHER): Payer: Self-pay | Admitting: Physician Assistant

## 2018-09-08 ENCOUNTER — Ambulatory Visit
Admission: RE | Admit: 2018-09-08 | Discharge: 2018-09-08 | Disposition: A | Discharge status: Home / Self Care | Payer: Commercial Managed Care - PPO | Source: Ambulatory Visit | Attending: Thoracic Surgery (Cardiothoracic Vascular Surgery) | Admitting: Thoracic Surgery (Cardiothoracic Vascular Surgery)

## 2018-09-08 VITALS — BP 118/66 | HR 72 | Resp 16 | Ht 71.0 in | Wt 224.4 lb

## 2018-09-08 DIAGNOSIS — J9811 Atelectasis: Secondary | ICD-10-CM | POA: Diagnosis not present

## 2018-09-08 DIAGNOSIS — Z953 Presence of xenogenic heart valve: Secondary | ICD-10-CM

## 2018-09-08 MED ORDER — AMIODARONE HCL 200 MG PO TABS: 200.0000 mg | ORAL_TABLET | Freq: Every day | ORAL | 0 refills | Status: DC

## 2018-09-08 MED ORDER — POTASSIUM CHLORIDE ER 20 MEQ PO TBCR: 20.0000 meq | EXTENDED_RELEASE_TABLET | Freq: Every day | ORAL | 0 refills | Status: DC

## 2018-09-08 MED ORDER — FUROSEMIDE 40 MG PO TABS: 40.0000 mg | ORAL_TABLET | Freq: Every day | ORAL | 0 refills | Status: DC

## 2018-09-08 NOTE — Progress Notes (Signed)
[address]       [place] [id number]             [phone]      Blake Griffin is a 62 y.o. male patient  Who underwent CABG x 1 and replacement of his Aortic valve on 08/06/2018. The patient went into atrial fibrillation postoperatively. He was treated with IV Amio and converted back to normal sinus rhythm. Today he presents for his routine 4 week follow-up appointment.    1. S/P aortic valve replacement with bioprosthetic valve + CABG    Past Medical History:  Diagnosis Date  . Anal fissure   . Aortic stenosis   . Bilateral carotid bruits   . COPD (chronic obstructive pulmonary disease) (HCC)   . Coronary artery disease involving native coronary artery of native heart with angina pectoris (HCC)   . Dyslipidemia   . ED (erectile dysfunction)   . Hemorrhoids   . Hypertension   . Hypogonadism in male   . Left bundle branch block (LBBB) 08/06/2018  . Leg cramps   . Pain of left shoulder region   . Petechiae   . Pneumonia 2016  . S/P AVR (aortic valve replacement)  08/06/2018   25 mm [employer] Intuity Elite stented bovine pericardial bioprosthetic tissue valve  [id number] Model: 8300AB  . 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 Smoking   . Vitamin D deficiency    No past surgical history pertinent negatives on file. Scheduled Meds: Current Outpatient Medications on File Prior to Visit  Medication Sig Dispense Refill  . acetaminophen (TYLENOL) 500 MG tablet Take 2 tablets (1,000 mg total) by mouth every 6 (six) hours as needed. 30 tablet 0  . albuterol (PROVENTIL HFA;VENTOLIN HFA) 108 (90 Base) MCG/ACT inhaler Inhale 2 puffs into the lungs every 6 (six) hours as needed for wheezing or shortness of breath.     Marland Kitchen. amiodarone (PACERONE) 200 MG tablet Take 1 tablet (200 mg total) by mouth 2 (two) times daily after a meal. 60 tablet 1  . aspirin EC 81 MG tablet Take 1 tablet (81 mg total) by mouth daily. 90 tablet 3  . atorvastatin  (LIPITOR) 40 MG tablet Take 1 tablet (40 mg total) by mouth daily. 30 tablet 11  . valACYclovir (VALTREX) 500 MG tablet Take 500 mg by mouth daily as needed (cold sores).      No current facility-administered medications on file prior to visit.     Blood pressure 118/66, pulse 72, resp. rate 16, height 5\' 11"  (1.803 m), weight 224 lb 6.4 oz (101.8 kg), SpO2 98 %.  Subjective     Objective   Cor: RRR, no murmur Pulm: CTA bilaterally and in all fields Abd: no tenderness Wound: C/D/I without drainage, EVH site healing well Ext: 1-2+ pitting edema in right leg (vein harvest leg)   CLINICAL DATA:  Post aortic valve replacement and CABG on 08/06/2017, now with incisional pain and some chest soreness  EXAM: CHEST - 2 VIEW  COMPARISON:  Chest x-ray 08/10/2017  FINDINGS: The pleural effusions noted previously have resolved and mild bibasilar linear atelectasis remains. No pneumonia is seen. Mediastinal and hilar contours are unremarkable. Cardiomegaly is stable and aortic valve replacement is present. Median sternotomy sutures appear normally aligned and intact.  IMPRESSION: No active lung disease.  Stable cardiomegaly.   Electronically Signed   By: Dwyane DeePaul  Barry M.D.   On: 09/08/2018  13:08   Assessment & Plan   Blake Griffin is a 62 year old male status post aortic valve replacement and coronary bypass grafting x1 by Dr. Cornelius Moras.  He visits our office today for his routine 4-week follow-up visit.  He has been ambulating with limited shortness of breath.  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  He no longer is requiring his tramadol, therefore I have cleared him for driving.  His sternum is stable on exam today and healing well.  He is in normal sinus rhythm.  I decrease his amiodarone from 200 mg twice a day to 200 mg daily.  I suggested following up with Dr. Tomie China for a rhythm check in one month. He is going to talk to his  office tomorrow since he will be there anyways for some blood work.  If he is in normal sinus rhythm at this time he may discontinue amiodarone altogether. He has gained three pounds since leaving the hospital and he does have some pitting edema in his right ankle which is the vein harvest site leg. His harvest site is healing well. I prescribed him 3 days of lasix for his edema with potassium supplementation. He is to weight himself every morning and keep track of his weight. If it stays the same or increases he is to call our office so we can adjust his lasix. He does not want to do cardiac rehab because he feels he can do these things on his own. Chest xray was discussed with the patient and all questions were answered to the patient's satisfaction.   Follow-up: Dr. Cornelius Moras in 2 months New prescriptions: Lasix 40mg  daily, potassium daily, and Amiodarone 200mg  daily.   Sharlene Dory 09/08/2018

## 2018-09-08 NOTE — Patient Instructions (Signed)
Make every effort to stay physically active, get some type of exercise on a regular basis, and stick to a "heart healthy diet".  The long term benefits for regular exercise and a healthy diet are critically important to your overall health and wellbeing.  You may return to driving an automobile as long as you are no longer requiring oral narcotic pain relievers during the daytime.  It would be wise to start driving only short distances during the daylight and gradually increase from there as you feel comfortable.  You are encouraged to enroll and participate in the outpatient cardiac rehab program beginning as soon as practical.   

## 2018-11-02 ENCOUNTER — Ambulatory Visit: Payer: Self-pay | Admitting: Thoracic Surgery (Cardiothoracic Vascular Surgery)

## 2018-11-16 ENCOUNTER — Other Ambulatory Visit: Payer: Self-pay | Admitting: *Deleted

## 2018-11-16 ENCOUNTER — Encounter: Payer: Self-pay | Admitting: Thoracic Surgery (Cardiothoracic Vascular Surgery)

## 2018-11-16 ENCOUNTER — Ambulatory Visit (INDEPENDENT_AMBULATORY_CARE_PROVIDER_SITE_OTHER): Payer: Medicare Other | Admitting: Thoracic Surgery (Cardiothoracic Vascular Surgery)

## 2018-11-16 VITALS — BP 185/80 | HR 67 | Resp 20 | Ht 71.0 in | Wt 234.0 lb

## 2018-11-16 DIAGNOSIS — Z953 Presence of xenogenic heart valve: Secondary | ICD-10-CM

## 2018-11-16 DIAGNOSIS — Z951 Presence of aortocoronary bypass graft: Secondary | ICD-10-CM | POA: Diagnosis not present

## 2018-11-16 MED ORDER — LISINOPRIL-HYDROCHLOROTHIAZIDE 20-12.5 MG PO TABS: 1.0000 | ORAL_TABLET | Freq: Every day | ORAL | 1 refills | Status: DC

## 2018-11-16 NOTE — Patient Instructions (Addendum)
Stop taking amiodarone  Resume taking your previous blood pressure pill (Zestoretic 20-12.5)  Check your blood pressure regularly and report your findings to your cardiologist and your primary care physician  Continue all other previous medications without any changes at this time  You may resume unrestricted physical activity without any particular limitations at this time.  Endocarditis is a potentially serious infection of heart valves or inside lining of the heart.  It occurs more commonly in patients with diseased heart valves (such as patient's with aortic or mitral valve disease) and in patients who have undergone heart valve repair or replacement.  Certain surgical and dental procedures may put you at risk, such as dental cleaning, other dental procedures, or any surgery involving the respiratory, urinary, gastrointestinal tract, gallbladder or prostate gland.   To minimize your chances for develooping endocarditis, maintain good oral health and seek prompt medical attention for any infections involving the mouth, teeth, gums, skin or urinary tract.    Always notify your doctor or dentist about your underlying heart valve condition before having any invasive procedures. You will need to take antibiotics before certain procedures, including all routine dental cleanings or other dental procedures.  Your cardiologist or dentist should prescribe these antibiotics for you to be taken ahead of time.

## 2018-11-16 NOTE — Progress Notes (Signed)
[address]       [place] [address]             [phone]     CARDIOTHORACIC SURGERY OFFICE NOTE  Referring Provider is Lyn Records, MD  Primary Cardiologist is Revankar, Aundra Dubin, MD PCP is Wilmer Floor., MD   HPI:  Patient is a 63 year old male with history of aortic stenosis, hypertension, hyperlipidemia, and long-standing tobacco abuse who returns to the office today for routine follow-up status post aortic valve replacement using a rapid deployment stented bovine pericardial tissue valve and coronary artery bypass grafting x1 on August 06, 2018 for severe symptomatic aortic stenosis and single-vessel coronary artery disease.  His early postoperative recovery was notable for the development of new-onset LBBB and postoperative atrial fibrillation which converted back to sinus rhythm on amiodarone.  He was discharged from the hospital in sinus rhythm.  He was not sent home on a beta blocker because of LBBB.  His previous medication for hypertension was not resumed because of borderline low blood pressure.  He was last seen here in our office on September 08, 2018 at which time he was doing well.  Amiodarone was decreased to 200 mg daily at that time.  He returns to our office today for routine follow-up.  He reports that he is doing exceptionally well.  He no longer has any pain in his chest.  He reports no significant exertional shortness of breath or chest discomfort, and he notes that his breathing is already notably better than it was prior to surgery.  He has not had any palpitations or other symptoms to suggest a recurrence of atrial fibrillation.  He does still have some right lower extremity edema.  He has not been checking his blood pressure at home.  He has not been seen by Dr. Tomie China nor his primary care physician.   Current Outpatient Medications  Medication Sig Dispense Refill  . acetaminophen (TYLENOL) 500 MG tablet Take 2 tablets (1,000 mg  total) by mouth every 6 (six) hours as needed. 30 tablet 0  . albuterol (PROVENTIL HFA;VENTOLIN HFA) 108 (90 Base) MCG/ACT inhaler Inhale 2 puffs into the lungs every 6 (six) hours as needed for wheezing or shortness of breath.     Marland Kitchen amiodarone (PACERONE) 200 MG tablet Take 1 tablet (200 mg total) by mouth daily. 30 tablet 0  . aspirin EC 81 MG tablet Take 1 tablet (81 mg total) by mouth daily. 90 tablet 3  . atorvastatin (LIPITOR) 40 MG tablet Take 1 tablet (40 mg total) by mouth daily. 30 tablet 11  . valACYclovir (VALTREX) 500 MG tablet Take 500 mg by mouth daily as needed (cold sores).      No current facility-administered medications for this visit.       Physical Exam:   BP (!) 185/80   Pulse 67   Resp 20   Ht 5\' 11"  (1.803 m)   Wt 234 lb (106.1 kg)   SpO2 95% Comment: RA  BMI 32.64 kg/m   General:  Well-appearing  Chest:   Clear to auscultation  CV:   Regular rate and rhythm without murmur  Incisions:  Completely healed, sternum stable  Abdomen:  Soft nontender  Extremities:  Warm and well-perfused  Diagnostic Tests:  n/a   Impression:  Patient is doing very well approximately 3 months status post aortic valve replacement and coronary artery bypass grafting.  His blood pressure is somewhat elevated in the  office today.  Plan:  I have instructed the patient to stop taking amiodarone and to resume taking Zestoretic which he had been taking for hypertension prior to his surgery.  I have instructed him to start taking his blood pressure on a regular basis and to make sure that he is scheduled for routine follow-up with Dr. Tomie China in the near future.  We will go ahead and order routine follow-up echocardiogram to assess left ventricular function and function of his bioprosthetic tissue valve.  We will also order a right lower extremity duplex scan to make sure he does not have deep venous thrombosis.  I have cleared the patient to return to unrestricted physical activity.   All of his questions have been answered.  The patient has been reminded regarding the importance of dental hygiene and the lifelong need for antibiotic prophylaxis for all dental cleanings and other related invasive procedures.  The patient will return to our office for routine follow-up next fall, approximately 1 year following his surgery.  He will call and return sooner should specific problems or questions arise.  I spent in excess of 15 minutes during the conduct of this office consultation and >50% of this time involved direct face-to-face encounter with the patient for counseling and/or coordination of their care.    Salvatore Decent. Cornelius Moras, MD 11/16/2018 1:54 PM

## 2018-12-02 DIAGNOSIS — K429 Umbilical hernia without obstruction or gangrene: Secondary | ICD-10-CM | POA: Diagnosis not present

## 2018-12-02 DIAGNOSIS — Z9181 History of falling: Secondary | ICD-10-CM | POA: Diagnosis not present

## 2018-12-02 DIAGNOSIS — Z1331 Encounter for screening for depression: Secondary | ICD-10-CM | POA: Diagnosis not present

## 2018-12-02 DIAGNOSIS — J449 Chronic obstructive pulmonary disease, unspecified: Secondary | ICD-10-CM | POA: Diagnosis not present

## 2018-12-02 DIAGNOSIS — E559 Vitamin D deficiency, unspecified: Secondary | ICD-10-CM | POA: Diagnosis not present

## 2018-12-02 DIAGNOSIS — E785 Hyperlipidemia, unspecified: Secondary | ICD-10-CM | POA: Diagnosis not present

## 2018-12-02 DIAGNOSIS — I10 Essential (primary) hypertension: Secondary | ICD-10-CM | POA: Diagnosis not present

## 2018-12-02 DIAGNOSIS — I350 Nonrheumatic aortic (valve) stenosis: Secondary | ICD-10-CM | POA: Diagnosis not present

## 2018-12-07 ENCOUNTER — Ambulatory Visit (INDEPENDENT_AMBULATORY_CARE_PROVIDER_SITE_OTHER): Payer: Medicare Other

## 2018-12-07 DIAGNOSIS — R600 Localized edema: Secondary | ICD-10-CM

## 2018-12-07 DIAGNOSIS — Z953 Presence of xenogenic heart valve: Secondary | ICD-10-CM | POA: Diagnosis not present

## 2018-12-07 NOTE — Progress Notes (Signed)
Lower extremity venous duplex exam has been performed, No evidence for DVT.  Jimmy Rudolfo Brandow RDCS, RVT

## 2018-12-07 NOTE — Progress Notes (Signed)
Complete echocardiogram has been performed.  Jimmy Merwin Breden RDCS, RVT 

## 2019-01-27 ENCOUNTER — Telehealth: Payer: Self-pay

## 2019-01-27 NOTE — Telephone Encounter (Signed)
Spoke with [name] and scheduled patient for 3 mo f/u visit. Informed to have BP cuff, weigh himself and all medications on hand for reconciliation/refills prior to virtual visit. Patient consents to virtual visit on 01/29/19 and had no further questions.

## 2019-01-29 ENCOUNTER — Telehealth (INDEPENDENT_AMBULATORY_CARE_PROVIDER_SITE_OTHER): Payer: Medicare Other | Admitting: Cardiology

## 2019-01-29 ENCOUNTER — Telehealth: Payer: Self-pay

## 2019-01-29 ENCOUNTER — Encounter: Payer: Self-pay | Admitting: Cardiology

## 2019-01-29 ENCOUNTER — Other Ambulatory Visit: Payer: Self-pay

## 2019-01-29 VITALS — BP 142/86 | HR 88 | Ht 71.0 in | Wt 224.0 lb

## 2019-01-29 DIAGNOSIS — I10 Essential (primary) hypertension: Secondary | ICD-10-CM | POA: Diagnosis not present

## 2019-01-29 DIAGNOSIS — F17210 Nicotine dependence, cigarettes, uncomplicated: Secondary | ICD-10-CM

## 2019-01-29 DIAGNOSIS — Z953 Presence of xenogenic heart valve: Secondary | ICD-10-CM

## 2019-01-29 DIAGNOSIS — I25119 Atherosclerotic heart disease of native coronary artery with unspecified angina pectoris: Secondary | ICD-10-CM

## 2019-01-29 DIAGNOSIS — J431 Panlobular emphysema: Secondary | ICD-10-CM

## 2019-01-29 DIAGNOSIS — I447 Left bundle-branch block, unspecified: Secondary | ICD-10-CM

## 2019-01-29 DIAGNOSIS — Z951 Presence of aortocoronary bypass graft: Secondary | ICD-10-CM

## 2019-01-29 NOTE — Patient Instructions (Signed)
Medication Instructions:  Your physician recommends that you continue on your current medications as directed. Please refer to the Current Medication list given to you today.  If you need a refill on your cardiac medications before your next appointment, please call your pharmacy.   Lab work: None If you have labs (blood work) drawn today and your tests are completely normal, you will receive your results only by: . MyChart Message (if you have MyChart) OR . A paper copy in the mail If you have any lab test that is abnormal or we need to change your treatment, we will call you to review the results.  Testing/Procedures: None  Follow-Up: At [facility], you and your health needs are our priority.  As part of our continuing mission to provide you with exceptional heart care, we have created designated Provider Care Teams.  These Care Teams include your primary Cardiologist (physician) and Advanced Practice Providers (APPs -  Physician Assistants and Nurse Practitioners) who all work together to provide you with the care you need, when you need it. You will need a follow up appointment in 4 months.  Any Other Special Instructions Will Be Listed Below (If Applicable).   

## 2019-01-29 NOTE — Progress Notes (Signed)
Virtual Visit via Video Note   This visit type was conducted due to national recommendations for restrictions regarding the COVID-19 Pandemic (e.g. social distancing) in an effort to limit this patient's exposure and mitigate transmission in our community.  Due to his co-morbid illnesses, this patient is at least at moderate risk for complications without adequate follow up.  This format is felt to be most appropriate for this patient at this time.  All issues noted in this document were discussed and addressed.  A limited physical exam was performed with this format.  Please refer to the patient's chart for his consent to telehealth for [facility].   Evaluation Performed:  Follow-up visit  Date:  01/29/2019   ID:  Blake Griffin, DOB 1956/07/09, MRN [id number]  Patient Location: Home Provider Location: Home  PCP:  Wilmer Floorampbell, Stephen D., MD  Cardiologist:  No primary care provider on file.  Electrophysiologist:  None   Chief Complaint:  CAD follow up  History of Present Illness:    Blake Griffin is a 63 y.o. male with past medical history of coronary artery disease and post aortic valve replacement, essential hypertension and mixed dyslipidemia.  Patient denies any problems at this time and takes care of activities of daily living.  No chest pain orthopnea or PND.  He is active around the house but does not exercise on a regular basis.  At the time of my evaluation, the patient is alert awake oriented and in no distress.  The patient does not have symptoms concerning for COVID-19 infection (fever, chills, cough, or new shortness of breath).    Past Medical History:  Diagnosis Date  . Anal fissure   . Aortic stenosis   . Bilateral carotid bruits   . COPD (chronic obstructive pulmonary disease) (HCC)   . Coronary artery disease involving native coronary artery of native heart with angina pectoris (HCC)   . Dyslipidemia   . ED (erectile dysfunction)   . Hemorrhoids   .  Hypertension   . Hypogonadism in male   . Left bundle branch block (LBBB) 08/06/2018  . Leg cramps   . Pain of left shoulder region   . Petechiae   . Pneumonia 2016  . S/P AVR (aortic valve replacement)  08/06/2018   25 mm [employer] Intuity Elite stented bovine pericardial bioprosthetic tissue valve  [id number] Model: 8300AB  . 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 Smoking   . Vitamin D deficiency    Past Surgical History:  Procedure Laterality Date  . AORTIC VALVE REPLACEMENT N/A 08/06/2018   Procedure: AORTIC VALVE REPLACEMENT (AVR);  Surgeon: Purcell Nailswen, Clarence H, MD;  [facility];  Service: Open Heart Surgery;  Laterality: N/A;  . APPENDECTOMY    . COLONOSCOPY W/ POLYPECTOMY     x 2  . CORONARY ARTERY BYPASS GRAFT N/A 08/06/2018   Procedure: CORONARY ARTERY BYPASS GRAFTING (CABG) times one using right greater saphenous vein harvested endoscopically and aortic valve replacement.;  Surgeon: Purcell Nailswen, Clarence H, MD;  [facility];  Service: Open Heart Surgery;  Laterality: N/A;  . Lung Biospy Right    lower lung  . RIGHT/LEFT HEART CATH AND CORONARY ANGIOGRAPHY N/A 06/30/2018   Procedure: RIGHT/LEFT HEART CATH AND CORONARY ANGIOGRAPHY;  Surgeon: Lyn RecordsSmith, Henry W, MD;  Location: [facility] INVASIVE CV LAB;  Service: Cardiovascular;  Laterality: N/A;  . TEE WITHOUT CARDIOVERSION N/A 08/06/2018   Procedure: TRANSESOPHAGEAL ECHOCARDIOGRAM (TEE);  Surgeon: Cornelius Moraswen,  Salvatore Decent, MD;  Location: [facility] OR;  Service: Open Heart Surgery;  Laterality: N/A;  . TOOTH EXTRACTION N/A 07/30/2018   Procedure: Extraction of tooth #5 w/ Alveoloplasty and Gross debridement of remaining dentition.;  Surgeon: Charlynne Pander, DDS;  Location: [facility] OR;  Service: Oral Surgery;  Laterality: N/A;     Current Meds  Medication Sig  . albuterol (PROVENTIL HFA;VENTOLIN HFA) 108 (90 Base) MCG/ACT inhaler Inhale 2 puffs into the lungs every 6 (six) hours as needed for wheezing or shortness of breath.   Marland Kitchen  aspirin EC 81 MG tablet Take 1 tablet (81 mg total) by mouth daily.  Marland Kitchen atorvastatin (LIPITOR) 40 MG tablet Take 1 tablet (40 mg total) by mouth daily.  Marland Kitchen lisinopril-hydrochlorothiazide (ZESTORETIC) 20-12.5 MG tablet Take 1 tablet by mouth daily.  . metoprolol tartrate (LOPRESSOR) 25 MG tablet Take 25 mg by mouth 2 (two) times daily.  . valACYclovir (VALTREX) 500 MG tablet Take 500 mg by mouth daily as needed (cold sores).      Allergies:   Patient has no known allergies.   Social History   Tobacco Use  . Smoking status: Former Smoker    Packs/day: 1.00    Years: 50.00    Pack years: 50.00    Types: Cigarettes    Last attempt to quit: 08/06/2018    Years since quitting: 0.4  . Smokeless tobacco: Never Used  Substance Use Topics  . Alcohol use: Yes    Alcohol/week: 14.0 standard drinks    Types: 14 Shots of liquor per week    Comment: Bacardi rum daily  . Drug use: Never     Family Hx: The patient's family history includes Coronary artery disease in his mother; Hypertension in his father and mother.  ROS:   Please see the history of present illness.    Patient denies any chest pain orthopnea or PND All other systems reviewed and are negative.   Prior CV studies:   The following studies were reviewed today:  None  Labs/Other Tests and Data Reviewed:    EKG:  No ECG reviewed.  Recent Labs: 06/29/2018: TSH 1.090 08/07/2018: Magnesium 2.7 08/09/2018: ALT 17 08/10/2018: Hemoglobin 10.0; Platelets 144 08/26/2018: BUN 15; Creatinine, Ser 0.84; Potassium 4.6; Sodium 140   Recent Lipid Panel Lab Results  Component Value Date/Time   CHOL 139 06/29/2018 09:38 AM   TRIG 173 (H) 06/29/2018 09:38 AM   HDL 38 (L) 06/29/2018 09:38 AM   CHOLHDL 3.7 06/29/2018 09:38 AM   LDLCALC 66 06/29/2018 09:38 AM    Wt Readings from Last 3 Encounters:  01/29/19 224 lb (101.6 kg)  11/16/18 234 lb (106.1 kg)  09/08/18 224 lb 6.4 oz (101.8 kg)     Objective:    Vital Signs:  BP (!)  142/86 (BP Location: Left Arm, Patient Position: Sitting, Cuff Size: Normal)   Pulse 88   Ht 5\' 11"  (1.803 m)   Wt 224 lb (101.6 kg)   BMI 31.24 kg/m    VITAL SIGNS:  reviewed  ASSESSMENT & PLAN:    1. Coronary artery disease: Secondary prevention stressed with the patient.  Importance of compliance with diet and medication stressed and he vocalized understanding.  Importance of regular exercise stressed.  He plans to walk half an hour a day 5 days a week at least.  Weight reduction was stressed and diet was discussed and risks of obesity explained 2. Essential hypertension: Stable now 3. Mixed dyslipidemia: Patient mentions to me that primary care  physician has done blood work 2 months ago and it was satisfactory. 4. Patient will be seen in follow-up appointment in 6 months or earlier if the patient has any concerns   COVID-19 Education: The signs and symptoms of COVID-19 were discussed with the patient and how to seek care for testing (follow up with PCP or arrange E-visit).  The importance of social distancing was discussed today.  Time:   Today, I have spent 16 minutes with the patient with telehealth technology discussing the above problems.     Medication Adjustments/Labs and Tests Ordered: Current medicines are reviewed at length with the patient today.  Concerns regarding medicines are outlined above.   Tests Ordered: No orders of the defined types were placed in this encounter.   Medication Changes: No orders of the defined types were placed in this encounter.   Disposition:  Follow up in 4 month(s)  Signed, Garwin Brothers, MD  01/29/2019 8:48 AM    [facility]

## 2019-01-29 NOTE — Telephone Encounter (Signed)
Virtual Visit Pre-Appointment Phone Call  Steps For Call:  1. Confirm consent - "In the setting of the current Covid19 crisis, you are scheduled for a (phone or video) visit with your provider on (date) at (time).  Just as we do with many in-office visits, in order for you to participate in this visit, we must obtain consent.  If you'd like, I can send this to your mychart (if signed up) or email for you to review.  Otherwise, I can obtain your verbal consent now.  All virtual visits are billed to your insurance company just like a normal visit would be.  By agreeing to a virtual visit, we'd like you to understand that the technology does not allow for your provider to perform an examination, and thus may limit your provider's ability to fully assess your condition.  Finally, though the technology is pretty good, we cannot assure that it will always work on either your or our end, and in the setting of a video visit, we may have to convert it to a phone-only visit.  In either situation, we cannot ensure that we have a secure connection.  Are you willing to proceed?" STAFF: Did the patient verbally acknowledge consent to telehealth visit? Document YES/NO here: yes  2. Confirm the BEST phone number to call the day of the visit by including in appointment notes  3. Give patient instructions for WebEx/MyChart download to smartphone as below or Doximity/Doxy.me if video visit (depending on what platform provider is using)  4. Advise patient to be prepared with their blood pressure, heart rate, weight, any heart rhythm information, their current medicines, and a piece of paper and pen handy for any instructions they may receive the day of their visit  5. Inform patient they will receive a phone call 15 minutes prior to their appointment time (may be from unknown caller ID) so they should be prepared to answer  6. Confirm that appointment type is correct in Epic appointment notes (VIDEO vs PHONE)      TELEPHONE CALL NOTE  Blake Griffin has been deemed a candidate for a follow-up tele-health visit to limit community exposure during the Covid-19 pandemic. I spoke with the patient via phone to ensure availability of phone/video source, confirm preferred email & phone number, and discuss instructions and expectations.  I reminded Blake Griffin to be prepared with any vital sign and/or heart rhythm information that could potentially be obtained via home monitoring, at the time of his visit. I reminded Blake Griffin to expect a phone call at the time of his visit if his visit.  Blake Griffin, [facility] 01/29/2019 8:34 AM   INSTRUCTIONS FOR DOWNLOADING THE WEBEX APP TO SMARTPHONE  - If Apple, ask patient to go to App Store and type in WebEx in the search bar. Download [employer], the blue/green circle. If Android, go to [employer] and type in [employer] in the search bar. The app is free but as with any other app downloads, their phone may require them to verify saved payment information or Apple/Android password.  - The patient does NOT have to create an account. - On the day of the visit, the assist will walk the patient through joining the meeting with the meeting number/password.  INSTRUCTIONS FOR DOWNLOADING THE MYCHART APP TO SMARTPHONE  - The patient must first make sure to have activated MyChart and know their login information - If Apple, go to [employer] and type in  MyChart in the search bar and download the app. If Android, ask patient to go to [employer] and type in [place] in the search bar and download the app. The app is free but as with any other app downloads, their phone may require them to verify saved payment information or Apple/Android password.  - The patient will need to then log into the app with their MyChart username and password, and select [facility] as their healthcare provider to link the account. When it is time for your visit, go to the MyChart app,  find appointments, and click Begin Video Visit. Be sure to Select Allow for your device to access the Microphone and Camera for your visit. You will then be connected, and your provider will be with you shortly.  **If they have any issues connecting, or need assistance please contact MyChart service desk [phone])**  **If using a computer, in order to ensure the best quality for their visit they will need to use either of the following Internet Browsers: [employer], or Google Chrome**  IF USING DOXIMITY or DOXY.ME - The patient will receive a link just prior to their visit, either by text or email (to be determined day of appointment depending on if it's doxy.me or Doximity).     FULL LENGTH CONSENT FOR TELE-HEALTH VISIT   I hereby voluntarily request, consent and authorize [facility] and its employed or contracted physicians, physician assistants, nurse practitioners or other licensed health care professionals (the Practitioner), to provide me with telemedicine health care services (the "Services") as deemed necessary by the treating Practitioner. I acknowledge and consent to receive the Services by the Practitioner via telemedicine. I understand that the telemedicine visit will involve communicating with the Practitioner through live audiovisual communication technology and the disclosure of certain medical information by electronic transmission. I acknowledge that I have been given the opportunity to request an in-person assessment or other available alternative prior to the telemedicine visit and am voluntarily participating in the telemedicine visit.  I understand that I have the right to withhold or withdraw my consent to the use of telemedicine in the course of my care at any time, without affecting my right to future care or treatment, and that the Practitioner or I may terminate the telemedicine visit at any time. I understand that I have the right to inspect all  information obtained and/or recorded in the course of the telemedicine visit and may receive copies of available information for a reasonable fee.  I understand that some of the potential risks of receiving the Services via telemedicine include:  Marland Kitchen Delay or interruption in medical evaluation due to technological equipment failure or disruption; . Information transmitted may not be sufficient (e.g. poor resolution of images) to allow for appropriate medical decision making by the Practitioner; and/or  . In rare instances, security protocols could fail, causing a breach of personal health information.  Furthermore, I acknowledge that it is my responsibility to provide information about my medical history, conditions and care that is complete and accurate to the best of my ability. I acknowledge that Practitioner's advice, recommendations, and/or decision may be based on factors not within their control, such as incomplete or inaccurate data provided by me or distortions of diagnostic images or specimens that may result from electronic transmissions. I understand that the practice of medicine is not an exact science and that Practitioner makes no warranties or guarantees regarding treatment outcomes. I acknowledge that I will receive a copy  of this consent concurrently upon execution via email to the email address I last provided but may also request a printed copy by calling the office of [facility].    I understand that my insurance will be billed for this visit.   I have read or had this consent read to me. . I understand the contents of this consent, which adequately explains the benefits and risks of the Services being provided via telemedicine.  . I have been provided ample opportunity to ask questions regarding this consent and the Services and have had my questions answered to my satisfaction. . I give my informed consent for the services to be provided through the use of telemedicine in my  medical care  By participating in this telemedicine visit I agree to the above.

## 2019-01-31 ENCOUNTER — Other Ambulatory Visit: Payer: Self-pay | Admitting: Thoracic Surgery (Cardiothoracic Vascular Surgery)

## 2019-04-06 DIAGNOSIS — I10 Essential (primary) hypertension: Secondary | ICD-10-CM | POA: Diagnosis not present

## 2019-04-06 DIAGNOSIS — Z9181 History of falling: Secondary | ICD-10-CM | POA: Diagnosis not present

## 2019-04-06 DIAGNOSIS — J449 Chronic obstructive pulmonary disease, unspecified: Secondary | ICD-10-CM | POA: Diagnosis not present

## 2019-04-06 DIAGNOSIS — E559 Vitamin D deficiency, unspecified: Secondary | ICD-10-CM | POA: Diagnosis not present

## 2019-04-06 DIAGNOSIS — K429 Umbilical hernia without obstruction or gangrene: Secondary | ICD-10-CM | POA: Diagnosis not present

## 2019-04-06 DIAGNOSIS — E785 Hyperlipidemia, unspecified: Secondary | ICD-10-CM | POA: Diagnosis not present

## 2019-04-06 DIAGNOSIS — I350 Nonrheumatic aortic (valve) stenosis: Secondary | ICD-10-CM | POA: Diagnosis not present

## 2019-04-06 DIAGNOSIS — Z1331 Encounter for screening for depression: Secondary | ICD-10-CM | POA: Diagnosis not present

## 2019-04-10 ENCOUNTER — Other Ambulatory Visit: Payer: Self-pay | Admitting: Cardiothoracic Surgery

## 2019-04-23 DIAGNOSIS — Z0000 Encounter for general adult medical examination without abnormal findings: Secondary | ICD-10-CM | POA: Diagnosis not present

## 2019-04-23 DIAGNOSIS — Z125 Encounter for screening for malignant neoplasm of prostate: Secondary | ICD-10-CM | POA: Diagnosis not present

## 2019-04-23 DIAGNOSIS — Z9181 History of falling: Secondary | ICD-10-CM | POA: Diagnosis not present

## 2019-04-23 DIAGNOSIS — Z1331 Encounter for screening for depression: Secondary | ICD-10-CM | POA: Diagnosis not present

## 2019-04-23 DIAGNOSIS — Z6841 Body Mass Index (BMI) 40.0 and over, adult: Secondary | ICD-10-CM | POA: Diagnosis not present

## 2019-04-23 DIAGNOSIS — E785 Hyperlipidemia, unspecified: Secondary | ICD-10-CM | POA: Diagnosis not present

## 2019-06-04 ENCOUNTER — Ambulatory Visit (INDEPENDENT_AMBULATORY_CARE_PROVIDER_SITE_OTHER): Payer: Medicare Other | Admitting: Cardiology

## 2019-06-04 ENCOUNTER — Other Ambulatory Visit: Payer: Self-pay

## 2019-06-04 ENCOUNTER — Encounter: Payer: Self-pay | Admitting: Cardiology

## 2019-06-04 VITALS — BP 140/80 | HR 54 | Ht 71.0 in | Wt 234.0 lb

## 2019-06-04 DIAGNOSIS — Z953 Presence of xenogenic heart valve: Secondary | ICD-10-CM | POA: Diagnosis not present

## 2019-06-04 DIAGNOSIS — I25119 Atherosclerotic heart disease of native coronary artery with unspecified angina pectoris: Secondary | ICD-10-CM

## 2019-06-04 DIAGNOSIS — I10 Essential (primary) hypertension: Secondary | ICD-10-CM

## 2019-06-04 DIAGNOSIS — Z87891 Personal history of nicotine dependence: Secondary | ICD-10-CM | POA: Diagnosis not present

## 2019-06-04 DIAGNOSIS — I447 Left bundle-branch block, unspecified: Secondary | ICD-10-CM

## 2019-06-04 DIAGNOSIS — Z951 Presence of aortocoronary bypass graft: Secondary | ICD-10-CM | POA: Diagnosis not present

## 2019-06-04 NOTE — Progress Notes (Signed)
Cardiology Office Note:    Date:  06/04/2019   ID:  Blake Griffin, DOB October 30, 1955, MRN [id number]  PCP:  Helen Hashimoto., MD  Cardiologist:  Jenean Lindau, MD   Referring MD: Helen Hashimoto., MD    ASSESSMENT:    1. Left bundle branch block (LBBB)   2. Essential hypertension   3. Coronary artery disease involving native coronary artery of native heart with angina pectoris ([place])   4. Benign essential HTN   5. S/P CABG x 1   6. S/P aortic valve replacement with bioprosthetic valve   7. Ex-smoker    PLAN:    In order of problems listed above:  1. Coronary artery disease: Secondary prevention stressed with the patient.  Importance of compliance with diet and medication stressed and he vocalized understanding.  His blood pressure stable. 2. Essential hypertension: Stable blood pressure 3. Mixed dyslipidemia: Diet was discussed.  I will get lab work done by primary care physician. 4. Status post aortic valve replacement with bioprosthetic valve: Stable from clinical standpoint. 5. Patient will be seen in follow-up appointment in 6 months or earlier if the patient has any concerns    Medication Adjustments/Labs and Tests Ordered: Current medicines are reviewed at length with the patient today.  Concerns regarding medicines are outlined above.  No orders of the defined types were placed in this encounter.  No orders of the defined types were placed in this encounter.    Chief Complaint  Patient presents with  . Follow-up  . Shortness of Breath     History of Present Illness:    Blake Griffin is a 63 y.o. male.  Patient has history of coronary artery disease and is post aortic valve replacement, essential hypertension and dyslipidemia.  He works outdoors significantly but leads overall sedentary lifestyle and does not exercise on a regular basis.  He mentions to me that he has gained weight.  He denies any chest pain orthopnea or PND.  At the time of my  evaluation, the patient is alert awake oriented and in no distress.  Past Medical History:  Diagnosis Date  . Anal fissure   . Aortic stenosis   . Bilateral carotid bruits   . COPD (chronic obstructive pulmonary disease) ([place])   . Coronary artery disease involving native coronary artery of native heart with angina pectoris ([place])   . Dyslipidemia   . ED (erectile dysfunction)   . Hemorrhoids   . Hypertension   . Hypogonadism in male   . Left bundle branch block (LBBB) 08/06/2018  . Leg cramps   . Pain of left shoulder region   . Petechiae   . Pneumonia 2016  . S/P AVR (aortic valve replacement)  08/06/2018   25 mm Edwards Intuity Elite stented bovine pericardial bioprosthetic tissue valve  [id number] Model: 8300AB  . 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 Smoking   . Vitamin D deficiency     Past Surgical History:  Procedure Laterality Date  . AORTIC VALVE REPLACEMENT N/A 08/06/2018   Procedure: AORTIC VALVE REPLACEMENT (AVR);  Surgeon: Rexene Alberts, MD;  Location: [facility];  Service: Open Heart Surgery;  Laterality: N/A;  . APPENDECTOMY    . COLONOSCOPY W/ POLYPECTOMY     x 2  . CORONARY ARTERY BYPASS GRAFT N/A 08/06/2018   Procedure: CORONARY ARTERY BYPASS GRAFTING (CABG) times one using right greater saphenous vein harvested endoscopically and aortic valve replacement.;  Surgeon: Purcell Nailswen, Clarence H, MD;  [facility];  Service: Open Heart Surgery;  Laterality: N/A;  . Lung Biospy Right    lower lung  . RIGHT/LEFT HEART CATH AND CORONARY ANGIOGRAPHY N/A 06/30/2018   Procedure: RIGHT/LEFT HEART CATH AND CORONARY ANGIOGRAPHY;  Surgeon: Lyn RecordsSmith, Henry W, MD;  Location: [facility] INVASIVE CV LAB;  Service: Cardiovascular;  Laterality: N/A;  . TEE WITHOUT CARDIOVERSION N/A 08/06/2018   Procedure: TRANSESOPHAGEAL ECHOCARDIOGRAM (TEE);  Surgeon: Purcell Nailswen, Clarence H, MD;  [facility];  Service: Open Heart Surgery;  Laterality: N/A;  . TOOTH EXTRACTION N/A 07/30/2018    Procedure: Extraction of tooth #5 w/ Alveoloplasty and Gross debridement of remaining dentition.;  Surgeon: Charlynne PanderKulinski, Ronald F, DDS;  Location: [facility] OR;  Service: Oral Surgery;  Laterality: N/A;    Current Medications: Current Meds  Medication Sig  . albuterol (PROVENTIL HFA;VENTOLIN HFA) 108 (90 Base) MCG/ACT inhaler Inhale 2 puffs into the lungs every 6 (six) hours as needed for wheezing or shortness of breath.   Marland Kitchen. aspirin EC 81 MG tablet Take 1 tablet (81 mg total) by mouth daily.  Marland Kitchen. atorvastatin (LIPITOR) 40 MG tablet Take 1 tablet (40 mg total) by mouth daily.  . D3-50 1.25 MG (50000 UT) capsule TAKE 1 CAPSULE BY MOUTH ONE TIME PER WEEK  . lisinopril-hydrochlorothiazide (ZESTORETIC) 20-12.5 MG tablet TAKE 1 TABLET BY MOUTH EVERY DAY  . metoprolol tartrate (LOPRESSOR) 25 MG tablet Take 25 mg by mouth 2 (two) times daily.  . naproxen sodium (ALEVE) 220 MG tablet Take 220 mg by mouth daily as needed (Taking 2 once a day for ache in neck).  . valACYclovir (VALTREX) 500 MG tablet Take 500 mg by mouth daily as needed (cold sores).      Allergies:   Patient has no known allergies.   Social History   Socioeconomic History  . Marital status: Married    Spouse name: Not on file  . Number of children: 5  . Years of education: Not on file  . Highest education level: Not on file  Occupational History  . Not on file  Social Needs  . Financial resource strain: Not on file  . Food insecurity    Worry: Not on file    Inability: Not on file  . Transportation needs    Medical: Not on file    Non-medical: Not on file  Tobacco Use  . Smoking status: Former Smoker    Packs/day: 1.00    Years: 50.00    Pack years: 50.00    Types: Cigarettes    Quit date: 08/06/2018    Years since quitting: 0.8  . Smokeless tobacco: Never Used  Substance and Sexual Activity  . Alcohol use: Yes    Alcohol/week: 14.0 standard drinks    Types: 14 Shots of liquor per week    Comment: Bacardi rum daily  .  Drug use: Never  . Sexual activity: Not on file  Lifestyle  . Physical activity    Days per week: Not on file    Minutes per session: Not on file  . Stress: Not on file  Relationships  . Social Musicianconnections    Talks on phone: Not on file    Gets together: Not on file    Attends religious service: Not on file    Active member of club or organization: Not on file    Attends meetings of clubs or organizations: Not on file    Relationship status: Not on file  Other Topics  Concern  . Not on file  Social History Narrative  . Not on file     Family History: The patient's family history includes Coronary artery disease in his mother; Hypertension in his father and mother.  ROS:   Please see the history of present illness.    All other systems reviewed and are negative.  EKGs/Labs/Other Studies Reviewed:    The following studies were reviewed today: Previous blood work reports were reviewed   Recent Labs: 06/29/2018: TSH 1.090 08/07/2018: Magnesium 2.7 08/09/2018: ALT 17 08/10/2018: Hemoglobin 10.0; Platelets 144 08/26/2018: BUN 15; Creatinine, Ser 0.84; Potassium 4.6; Sodium 140  Recent Lipid Panel    Component Value Date/Time   CHOL 139 06/29/2018 0938   TRIG 173 (H) 06/29/2018 0938   HDL 38 (L) 06/29/2018 0938   CHOLHDL 3.7 06/29/2018 0938   LDLCALC 66 06/29/2018 0938    Physical Exam:    VS:  BP 140/80 (BP Location: Left Arm, Patient Position: Sitting, Cuff Size: Normal)   Pulse (!) 54   Ht 5\' 11"  (1.803 m)   Wt 234 lb (106.1 kg)   SpO2 94%   BMI 32.64 kg/m     Wt Readings from Last 3 Encounters:  06/04/19 234 lb (106.1 kg)  01/29/19 224 lb (101.6 kg)  11/16/18 234 lb (106.1 kg)     GEN: Patient is in no acute distress HEENT: Normal NECK: No JVD; No carotid bruits LYMPHATICS: No lymphadenopathy CARDIAC: Hear sounds regular, 2/6 systolic murmur at the apex. RESPIRATORY:  Clear to auscultation without rales, wheezing or rhonchi  ABDOMEN: Soft,  non-tender, non-distended MUSCULOSKELETAL:  No edema; No deformity  SKIN: Warm and dry NEUROLOGIC:  Alert and oriented x 3 PSYCHIATRIC:  Normal affect   Signed, Garwin Brothersajan R Mariyam Remington, MD  06/04/2019 1:46 PM     [facility]

## 2019-06-04 NOTE — Patient Instructions (Signed)

## 2019-06-22 DIAGNOSIS — Z23 Encounter for immunization: Secondary | ICD-10-CM | POA: Diagnosis not present

## 2019-06-23 ENCOUNTER — Telehealth: Payer: Self-pay

## 2019-06-23 DIAGNOSIS — I25119 Atherosclerotic heart disease of native coronary artery with unspecified angina pectoris: Secondary | ICD-10-CM

## 2019-06-23 DIAGNOSIS — Z13220 Encounter for screening for lipoid disorders: Secondary | ICD-10-CM

## 2019-06-23 MED ORDER — ATORVASTATIN CALCIUM 40 MG PO TABS: 20.0000 mg | ORAL_TABLET | Freq: Every day | ORAL | 5 refills | Status: DC

## 2019-06-23 NOTE — Telephone Encounter (Signed)
Patient informed to decrease statin dose to 20 mg daily on atorvastatin and he will come back in 6 weeks for repeat labs.

## 2019-06-23 NOTE — Addendum Note (Signed)
Addended by: Beckey Rutter on: 06/23/2019 11:53 AM   Modules accepted: Orders

## 2019-06-23 NOTE — Telephone Encounter (Signed)
Attempted to call patient about most recent lab results from [facility]. Dr. Docia Furl would like patient to decrease atorvastatin dose to 20 mg once daily. No answer on home or mobile phone.

## 2019-08-09 ENCOUNTER — Other Ambulatory Visit: Payer: Self-pay | Admitting: Interventional Cardiology

## 2019-08-13 ENCOUNTER — Other Ambulatory Visit: Payer: Self-pay

## 2019-08-13 NOTE — Telephone Encounter (Signed)
Called patient to verify how he was currently taking atorvastatin 40MG .  Patient stated he is currently taking Atorvastatin 40MG  1/2 tablet daily.  I will send medication in how patient is taking it.

## 2019-08-16 MED ORDER — ATORVASTATIN CALCIUM 40 MG PO TABS: ORAL_TABLET | ORAL | 0 refills | Status: DC

## 2019-08-30 ENCOUNTER — Ambulatory Visit: Payer: Self-pay | Admitting: Thoracic Surgery (Cardiothoracic Vascular Surgery)

## 2019-09-06 ENCOUNTER — Other Ambulatory Visit: Payer: Self-pay

## 2019-09-06 ENCOUNTER — Telehealth (INDEPENDENT_AMBULATORY_CARE_PROVIDER_SITE_OTHER): Payer: Medicare Other | Admitting: Thoracic Surgery (Cardiothoracic Vascular Surgery)

## 2019-09-06 DIAGNOSIS — Z953 Presence of xenogenic heart valve: Secondary | ICD-10-CM

## 2019-09-06 DIAGNOSIS — Z951 Presence of aortocoronary bypass graft: Secondary | ICD-10-CM

## 2019-09-06 NOTE — Progress Notes (Signed)
[address]       [place] [address]             [phone]     CARDIOTHORACIC SURGERY TELEPHONE VIRTUAL OFFICE NOTE  Referring Provider is Belva Crome, MD Primary Cardiologist is Revankar, Reita Cliche, MD PCP is Helen Hashimoto., MD   HPI:  I spoke with Blake Griffin (DOB 01/26/1956 ) via telephone on 09/06/2019 at 10:56 AM and verified that I was speaking with the correct person using more than one form of identification.  We discussed the reason(s) for conducting our visit virtually instead of in-person.  The patient expressed understanding the circumstances and agreed to proceed as described.   Patient is a 63 year old male with history of aortic stenosis, coronary artery disease, hypertension, hyperlipidemia, and tobacco abuse who underwent aortic valve replacement using a rapid deployment stented bovine pericardial tissue valve and coronary artery bypass grafting x1 on August 06, 2018 for severe symptomatic aortic stenosis and single-vessel coronary artery disease.  He was last seen here in our office for routine follow-up on November 16, 2018 at which time he was doing very well.  He underwent routine follow-up echocardiogram December 07, 2018 that revealed normal left ventricular systolic function and normal functioning bioprosthetic tissue valve in the aortic position.  There was no aortic insufficiency and mean transvalvular gradient across aortic valve was estimated 8 mmHg.  Since then the patient has done well clinically.  He was seen in follow-up by Dr. Geraldo Pitter on June 04, 2019.  I spoke with the patient over the telephone today and he reports that he is doing very well.  He is back to normal physical activity and he reports no physical limitations whatsoever.  He specifically denies any symptoms of exertional shortness of breath or chest discomfort.  Overall he feels "much improved" in comparison with how he felt prior to surgery.  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   Current Outpatient Medications  Medication Sig Dispense Refill   albuterol (PROVENTIL HFA;VENTOLIN HFA) 108 (90 Base) MCG/ACT inhaler Inhale 2 puffs into the lungs every 6 (six) hours as needed for wheezing or shortness of breath.      aspirin EC 81 MG tablet Take 1 tablet (81 mg total) by mouth daily. 90 tablet 3   atorvastatin (LIPITOR) 40 MG tablet Take half tablet (20MG ) by mouth daily. 45 tablet 0   D3-50 1.25 MG (50000 UT) capsule TAKE 1 CAPSULE BY MOUTH ONE TIME PER WEEK     lisinopril-hydrochlorothiazide (ZESTORETIC) 20-12.5 MG tablet TAKE 1 TABLET BY MOUTH EVERY DAY 30 tablet 1   metoprolol tartrate (LOPRESSOR) 25 MG tablet Take 25 mg by mouth 2 (two) times daily.     naproxen sodium (ALEVE) 220 MG tablet Take 220 mg by mouth daily as needed (Taking 2 once a day for ache in neck).     valACYclovir (VALTREX) 500 MG tablet Take 500 mg by mouth daily as needed (cold sores).      No current facility-administered medications for this visit.      Diagnostic Tests:    ECHOCARDIOGRAM REPORT       Patient Name:   Blake Griffin Date of Exam: 12/07/2018 Medical Rec #:  [id number]     Height:       71.0 in Accession #:    [id number]    Weight:  234.0 lb Date of Birth:  Feb 06, 1956     BSA:          2.25 m Patient Age:    63 years      BP:           185/80 mmHg Patient Gender: M             HR:           62 bpm. Exam Location:  [place]    Procedure: 2D Echo  Indications:    Aortic Valve Disorder 424.1 / I35.9   History:        Patient has prior history of Echocardiogram examinations, most                 recent 06/29/2018. CAD, Prior CABG and LBBB; Risk Factors: Former                 Smoker and Hypertension.   Sonographer:    Louie BostonJames Reel Referring Phys: [id number] Maxim Bedel H Levorn Oleski  IMPRESSIONS    1. The left ventricle has normal systolic function with an ejection fraction of 60-65%. The  cavity size was normal. There is mildly increased left ventricular wall thickness. Left ventricular diastolic Doppler parameters are consistent with impaired  relaxation.  2. The right ventricle has normal systolic function. The cavity was normal. There is no increase in right ventricular wall thickness.  3. Left atrial size was moderately dilated.  4. The mitral valve is normal in structure.  5. The tricuspid valve is normal in structure.  6. Bioprosthetic no stenosis of the aortic valve.  7. The pulmonic valve was normal in structure.  8. 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  FINDINGS  Left Ventricle: The left ventricle has normal systolic function, with an ejection fraction of 60-65%. The cavity size was normal. There is mildly increased left ventricular wall thickness. Left ventricular diastolic Doppler parameters are consistent  with impaired relaxation Right Ventricle: The right ventricle has normal systolic function. The cavity was normal. There is no increase in right ventricular wall thickness. Left Atrium: left atrial size was moderately dilated Right Atrium: right atrial size was not assessed. Right atrial pressure is estimated at 3 mmHg. Interatrial Septum: No atrial level shunt detected by color flow Doppler. Pericardium: There is no evidence of pericardial effusion. Mitral Valve: The mitral valve is normal in structure. Mitral valve regurgitation is not visualized by color flow Doppler. Tricuspid Valve: The tricuspid valve is normal in structure. Tricuspid valve regurgitation was not visualized by color flow Doppler. Aortic Valve: bioprosthetic Aortic valve regurgitation was not visualized by color flow Doppler. There is no stenosis of the aortic valve. Satisfactory function.   Pulmonic Valve: The pulmonic valve was normal in structure. Pulmonic valve regurgitation is not visualized by color flow Doppler. Aorta: There is mild dilatation of the  ascending aorta measuring 39 mm. Venous: The inferior vena cava measures 2.20 cm, is normal in size with greater than 50% respiratory variability.   LEFT VENTRICLE PLAX 2D ([place])              Biplane EF (MOD) LV EF:          67.7 %       LV Biplane EF:   49.2 % LVIDd:          6.00 cm      LV A4C EF:       46.6 % LVIDs:  3.70 cm      LV A2C EF:       48.5 % LV PW:          1.30 cm LV IVS:         1.40 cm      Diastology LV SV:          122 ml       LV e' lateral:   8.59 cm/s                              LV E/e' lateral: 13.7 LV Volumes (MOD)             LV e' medial:    5.00 cm/s LV area d, A2C:    32.50 cm LV E/e' medial:  23.6 LV area d, A4C:    33.90 cm LV area s, A2C:    22.20 cm LV area s, A4C:    23.50 cm LV major d, A2C:   8.82 cm LV major d, A4C:   9.42 cm LV major s, A2C:   8.42 cm LV major s, A4C:   8.31 cm LV vol d, MOD A2C: 99.8 ml LV vol d, MOD A4C: 103.0 ml LV vol s, MOD A2C: 51.4 ml LV vol s, MOD A4C: 55.0 ml LV SV MOD A2C:     48.4 ml LV SV MOD A4C:     103.0 ml LV SV MOD BP:      51.3 ml  RIGHT VENTRICLE RV S prime:     8.49 cm/s TAPSE (M-mode): 1.6 cm  LEFT ATRIUM             Index       RIGHT ATRIUM           Index LA diam:        4.70 cm 2.09 cm/m  RA Pressure: 3 mmHg LA Vol (A2C):   91.0 ml 40.37 ml/m RA Area:     19.40 cm LA Vol (A4C):   58.6 ml 26.00 ml/m RA Volume:   57.70 ml  25.60 ml/m LA Biplane Vol: 73.4 ml 32.56 ml/m  AORTIC VALVE AV Vmax:           202.00 cm/s AV Vmean:          130.000 cm/s AV VTI:            0.422 m AV Peak Grad:      16.3 mmHg AV Mean Grad:      8.0 mmHg LVOT Vmax:         111.00 cm/s LVOT Vmean:        76.300 cm/s LVOT VTI:          0.232 m LVOT/AV VTI ratio: 0.55   AORTA Ao Root diam: 3.20 cm Ao Asc diam:  3.90 cm  MITRAL VALVE MV Area (PHT): 2.71 cm MV PHT:        81.20 msec MV Decel Time: 280 msec MV E velocity: 118.00 cm/s MV A velocity: 81.00 cm/s MV E/A ratio:  1.46  IVC IVC  diam: 2.20 cm    Belva Crome MD Electronically signed by Belva Crome MD Signature Date/Time: 12/07/2018/12:43:23 PM       Impression:  Patient is doing very well more than 1 year status post aortic valve replacement using a bioprosthetic tissue valve and coronary artery bypass grafting  Plan:  We have not recommended any changes to the patient's current  medications.  The patient has been reminded regarding the importance of dental hygiene and the lifelong need for antibiotic prophylaxis for all dental cleanings and other related invasive procedures.  The patient may return to unrestricted physical activity.  He will continue to follow-up periodically with Dr. Tomie China and return to our office in the future only should specific problems or questions arise.    I discussed limitations of evaluation and management via telephone.  The patient was advised to call back for repeat telephone consultation or to seek an in-person evaluation if questions arise or the patient's clinical condition changes in any significant manner.  I spent in excess of 5 minutes of non-face-to-face time during the conduct of this telephone virtual office consultation.     Salvatore Decent. Cornelius Moras, MD 09/06/2019 10:56 AM

## 2019-09-06 NOTE — Patient Instructions (Signed)

## 2019-11-15 ENCOUNTER — Other Ambulatory Visit: Payer: Self-pay

## 2019-11-15 MED ORDER — ATORVASTATIN CALCIUM 40 MG PO TABS: ORAL_TABLET | ORAL | 6 refills | Status: DC

## 2019-12-18 DIAGNOSIS — Z23 Encounter for immunization: Secondary | ICD-10-CM | POA: Diagnosis not present

## 2020-01-15 DIAGNOSIS — Z23 Encounter for immunization: Secondary | ICD-10-CM | POA: Diagnosis not present

## 2020-01-26 DIAGNOSIS — E785 Hyperlipidemia, unspecified: Secondary | ICD-10-CM | POA: Diagnosis not present

## 2020-01-26 DIAGNOSIS — K429 Umbilical hernia without obstruction or gangrene: Secondary | ICD-10-CM | POA: Diagnosis not present

## 2020-01-26 DIAGNOSIS — Z139 Encounter for screening, unspecified: Secondary | ICD-10-CM | POA: Diagnosis not present

## 2020-01-26 DIAGNOSIS — I350 Nonrheumatic aortic (valve) stenosis: Secondary | ICD-10-CM | POA: Diagnosis not present

## 2020-01-26 DIAGNOSIS — E559 Vitamin D deficiency, unspecified: Secondary | ICD-10-CM | POA: Diagnosis not present

## 2020-01-26 DIAGNOSIS — I10 Essential (primary) hypertension: Secondary | ICD-10-CM | POA: Diagnosis not present

## 2020-01-26 DIAGNOSIS — J449 Chronic obstructive pulmonary disease, unspecified: Secondary | ICD-10-CM | POA: Diagnosis not present

## 2020-05-31 DIAGNOSIS — J449 Chronic obstructive pulmonary disease, unspecified: Secondary | ICD-10-CM | POA: Diagnosis not present

## 2020-05-31 DIAGNOSIS — E785 Hyperlipidemia, unspecified: Secondary | ICD-10-CM | POA: Diagnosis not present

## 2020-05-31 DIAGNOSIS — Z6841 Body Mass Index (BMI) 40.0 and over, adult: Secondary | ICD-10-CM | POA: Diagnosis not present

## 2020-05-31 DIAGNOSIS — K429 Umbilical hernia without obstruction or gangrene: Secondary | ICD-10-CM | POA: Diagnosis not present

## 2020-05-31 DIAGNOSIS — Z720 Tobacco use: Secondary | ICD-10-CM | POA: Diagnosis not present

## 2020-05-31 DIAGNOSIS — Z79899 Other long term (current) drug therapy: Secondary | ICD-10-CM | POA: Diagnosis not present

## 2020-05-31 DIAGNOSIS — I10 Essential (primary) hypertension: Secondary | ICD-10-CM | POA: Diagnosis not present

## 2020-05-31 DIAGNOSIS — I350 Nonrheumatic aortic (valve) stenosis: Secondary | ICD-10-CM | POA: Diagnosis not present

## 2020-05-31 DIAGNOSIS — E559 Vitamin D deficiency, unspecified: Secondary | ICD-10-CM | POA: Diagnosis not present

## 2020-05-31 DIAGNOSIS — Z87891 Personal history of nicotine dependence: Secondary | ICD-10-CM | POA: Diagnosis not present

## 2020-06-08 IMAGING — CT CT CTA ABD/PEL W/CM AND/OR W/O CM
1 of 3 series · 1 of 27 positions shown · IV contrast (iopamidol)
Comparison: 12/22/2003

ADDENDUM:
VASCULAR MEASUREMENTS PERTINENT TO TAVR:

AORTA:
Minimal Aortic Viameter-LR x 14 mm
Severity of Aortic Calcification-severe
RIGHT PELVIS:
Right Common Iliac Artery -
Minimal Hiameter-99.3 x 12.8 mm
Tortuosity-mild
Calcification-severe
Right External Iliac Artery -
Minimal Ciameter-8.N x 3.7 mm
Right Common Femoral Artery -
Minimal Iiameter-6.3 x 4.0 mm
Calcification-moderate
LEFT PELVIS:
Left Common Iliac Artery -
Minimal 9iameter-R.F x 4.2 mm
Left External Iliac Artery -
Minimal Jiameter-O.C x 3.0 mm
Left Common Femoral Artery -
Minimal Ciameter-8.N x 5.1 mm
Review of the MIP images confirms the above findings.
CLINICAL DATA: smoking, CAD, severe AS, COPD, HTN, dyslipidemia,
dyspnea. Carotid bruits are listed in history, but no carotid bruits
documented on 06/29/18 exam by cardiologist Dr. Martluck (has
pre-CABG carotid Duplex scheduled for 08/04/18). BMI is consistent
with mild obesity.
EXAM:
CT ANGIOGRAPHY CHEST, ABDOMEN AND PELVIS
TECHNIQUE: Multidetector CT imaging through the chest, abdomen and pelvis was
performed using the standard protocol during bolus administration of
intravenous contrast. Multiplanar reconstructed images and MIPs were
obtained and reviewed to evaluate the vascular anatomy.
CONTRAST:  100mL ZKLR0C-9SD IOPAMIDOL (ZKLR0C-9SD) INJECTION 76%

[Series 223: — · 0.17mm/px · 1 of 10 slices shown]
[im 6/10]
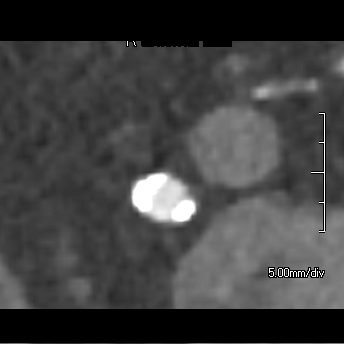

[1 of 27 positions shown; findings below may reference images not displayed]

FINDINGS: CTA CHEST FINDINGS

Cardiovascular: Heart size normal. No pericardial effusion. Dilated
central pulmonary arteries. Fairly good contrast opacification of
the pulmonary arterial tree; the exam was not optimized for
detection of pulmonary emboli.

Scattered coronary calcifications. Heavy aortic leaflet
calcifications. Adequate contrast opacification of the thoracic
aorta with no evidence of dissection, aneurysm, or stenosis. There
is classic 3-vessel brachiocephalic arch anatomy without proximal
stenosis. Calcified atheromatous plaque in the arch.

Mediastinum/Nodes: No hilar or mediastinal adenopathy.

Lungs/Pleura: No pleural effusion. No pneumothorax. Previous right
thoracotomy with surgical staple line in the posterior right lower
lobe. Adjacent coarse interstitial and airspace opacities in the
posterior right upper lobe. Linear scarring or subsegmental
atelectasis in the posterior left lower lobe and inferior lingula.
Emphysematous changes most evident in the upper lobes.

Musculoskeletal: Small focus of right lower lobe herniation
posteriorly into the intercostal space below the right eighth rib at
the level of prior surgery. Spondylitic changes in the visualized
lower cervical spine. Anterior vertebral endplate spurring at
multiple levels in the lower thoracic spine. No fracture or
worrisome bone

Review of the MIP images confirms the above findings.

CTA ABDOMEN AND PELVIS FINDINGS

VASCULAR

Aorta: Moderate partially calcified atheromatous plaque particularly
in the infrarenal segment, ectatic up to 3 cm at the level of the
bifurcation, with some scattered eccentric nonocclusive mural
thrombus. No stenosis..

Celiac: Short-segment origin stenosis at the level of the median
arcuate ligament of the diaphragm, patent distally.

SMA: Calcified nonocclusive ostial plaque. Patent distally. Replaced
right hepatic arterial supply, an anatomic variant.

Renals: Single right, with scattered calcified plaque, no high-grade
stenosis. Duplicated left, superior dominant, both with scattered
calcified nonocclusive plaque.

IMA: Patent without evidence of aneurysm, dissection, vasculitis or
significant stenosis.

Inflow: Ectatic proximal left common iliac artery up to 2 cm
diameter. Origin stenosis of the left internal iliac artery with
somewhat saccular 1.4 cm aneurysmal segment just beyond the origin,
containing some mural thrombus. Heavy calcified plaque through the
left external iliac artery with focal stenosis of in its midportion
of possible hemodynamic significance.

Fusiform dilatation of the distal right common iliac artery up to
2.5 cm diameter. Short-segment origin stenosis of the internal iliac
artery. Heavily calcified eccentric plaque through the right
external iliac without high-grade stenosis. No significant
tortuosity of the iliac arterial system.

Visualized portions of proximal outflow have scattered calcified
plaque but no aneurysm or stenosis.

Veins: No obvious venous abnormality within the limitations of this
arterial phase study.

Review of the MIP images confirms the above findings.

NON-VASCULAR

Hepatobiliary: No focal liver abnormality is seen. No gallstones,
gallbladder wall thickening, or biliary dilatation.

Pancreas: Unremarkable. No pancreatic ductal dilatation or
surrounding inflammatory changes.

Spleen: Normal in size without focal abnormality.

Adrenals/Urinary Tract: Adrenal glands are unremarkable. Kidneys are
normal, without renal calculi, focal lesion, or hydronephrosis.
Bladder is incompletely distended, with eccentric mural thickening
along its right anterior wall, with 1 cm nodular extension into the
extraperitoneal fat.

Stomach/Bowel: Stomach and small bowel are nondilated. Appendix not
discretely identified. No pericecal inflammatory/edematous change.
The colon is nondilated. Probable internal hemorrhoids.

Lymphatic: No abdominal or pelvic adenopathy.

Reproductive: Moderate prostatic enlargement with coarse central
calcifications.

Other: No ascites.  No free air.

Musculoskeletal: Small umbilical hernia containing mesenteric fat.
Spondylitic changes in the lumbar spine. No fracture or worrisome
bone lesion.

Review of the MIP images confirms the above findings.
IMPRESSION: 1. Infrarenal abdominal aortic aneurysm up to 3 cm diameter, with
2.5 cm dilatation of right common iliac artery, 2 cm left internal
iliac artery. Recommend followup by US in 3 years. This
recommendation follows ACR consensus guidelines: White Paper of the
ACR Incidental Findings Committee II on Vascular Findings. [HOSPITAL] 0953; [DATE]
2. Short-segment stenosis in the mid left external iliac artery of
possible hemodynamic significance. Consider correlation with with
lower extremity segmental studies.
3. Eccentric wall thickening anteriorly in the urinary bladder with
extension into the extraperitoneal space. Consider urologic
evaluation to exclude neoplasm.
4. Postop changes in the right lower lobe. Adjacent parenchymal
changes may be secondary to prior radiation, correlate with clinical
history.

## 2020-09-01 DIAGNOSIS — Z23 Encounter for immunization: Secondary | ICD-10-CM | POA: Diagnosis not present

## 2020-10-03 DIAGNOSIS — Z79899 Other long term (current) drug therapy: Secondary | ICD-10-CM | POA: Diagnosis not present

## 2020-10-03 DIAGNOSIS — E785 Hyperlipidemia, unspecified: Secondary | ICD-10-CM | POA: Diagnosis not present

## 2020-10-03 DIAGNOSIS — I350 Nonrheumatic aortic (valve) stenosis: Secondary | ICD-10-CM | POA: Diagnosis not present

## 2020-10-03 DIAGNOSIS — E559 Vitamin D deficiency, unspecified: Secondary | ICD-10-CM | POA: Diagnosis not present

## 2020-10-03 DIAGNOSIS — Z6841 Body Mass Index (BMI) 40.0 and over, adult: Secondary | ICD-10-CM | POA: Diagnosis not present

## 2020-10-03 DIAGNOSIS — J449 Chronic obstructive pulmonary disease, unspecified: Secondary | ICD-10-CM | POA: Diagnosis not present

## 2020-10-03 DIAGNOSIS — K429 Umbilical hernia without obstruction or gangrene: Secondary | ICD-10-CM | POA: Diagnosis not present

## 2020-10-03 DIAGNOSIS — I10 Essential (primary) hypertension: Secondary | ICD-10-CM | POA: Diagnosis not present

## 2020-11-02 ENCOUNTER — Telehealth: Payer: Self-pay | Admitting: Cardiology

## 2020-11-02 NOTE — Telephone Encounter (Signed)
Patient's wife states the patient needs to have a stress test and an echo for his [employer] licence.

## 2020-11-03 ENCOUNTER — Telehealth: Payer: Self-pay | Admitting: Cardiology

## 2020-11-03 DIAGNOSIS — I350 Nonrheumatic aortic (valve) stenosis: Secondary | ICD-10-CM | POA: Insufficient documentation

## 2020-11-03 DIAGNOSIS — R252 Cramp and spasm: Secondary | ICD-10-CM | POA: Insufficient documentation

## 2020-11-03 DIAGNOSIS — M25512 Pain in left shoulder: Secondary | ICD-10-CM | POA: Insufficient documentation

## 2020-11-03 DIAGNOSIS — K649 Unspecified hemorrhoids: Secondary | ICD-10-CM | POA: Insufficient documentation

## 2020-11-03 DIAGNOSIS — F17200 Nicotine dependence, unspecified, uncomplicated: Secondary | ICD-10-CM | POA: Insufficient documentation

## 2020-11-03 DIAGNOSIS — N529 Male erectile dysfunction, unspecified: Secondary | ICD-10-CM | POA: Insufficient documentation

## 2020-11-03 DIAGNOSIS — R0989 Other specified symptoms and signs involving the circulatory and respiratory systems: Secondary | ICD-10-CM | POA: Insufficient documentation

## 2020-11-03 DIAGNOSIS — K602 Anal fissure, unspecified: Secondary | ICD-10-CM | POA: Insufficient documentation

## 2020-11-03 DIAGNOSIS — E291 Testicular hypofunction: Secondary | ICD-10-CM | POA: Insufficient documentation

## 2020-11-03 DIAGNOSIS — R233 Spontaneous ecchymoses: Secondary | ICD-10-CM | POA: Insufficient documentation

## 2020-11-03 DIAGNOSIS — E785 Hyperlipidemia, unspecified: Secondary | ICD-10-CM | POA: Insufficient documentation

## 2020-11-03 NOTE — Telephone Encounter (Signed)
Echo at her office and treadmill Lexiscan at [facility] or in [place]

## 2020-11-03 NOTE — Telephone Encounter (Signed)
appointment made for 10/15/20

## 2020-11-03 NOTE — Telephone Encounter (Signed)
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 By the 29 of this month. Please advise

## 2020-11-07 ENCOUNTER — Ambulatory Visit (INDEPENDENT_AMBULATORY_CARE_PROVIDER_SITE_OTHER): Payer: Medicare HMO | Admitting: Cardiology

## 2020-11-07 ENCOUNTER — Other Ambulatory Visit: Payer: Self-pay

## 2020-11-07 ENCOUNTER — Encounter: Payer: Self-pay | Admitting: Cardiology

## 2020-11-07 VITALS — BP 178/84 | HR 63 | Ht 71.0 in | Wt 234.4 lb

## 2020-11-07 DIAGNOSIS — I25119 Atherosclerotic heart disease of native coronary artery with unspecified angina pectoris: Secondary | ICD-10-CM

## 2020-11-07 DIAGNOSIS — I447 Left bundle-branch block, unspecified: Secondary | ICD-10-CM

## 2020-11-07 DIAGNOSIS — Z952 Presence of prosthetic heart valve: Secondary | ICD-10-CM | POA: Diagnosis not present

## 2020-11-07 DIAGNOSIS — Z953 Presence of xenogenic heart valve: Secondary | ICD-10-CM

## 2020-11-07 DIAGNOSIS — E785 Hyperlipidemia, unspecified: Secondary | ICD-10-CM | POA: Diagnosis not present

## 2020-11-07 NOTE — Patient Instructions (Signed)
Medication Instructions:  No medication changes. *If you need a refill on your cardiac medications before your next appointment, please call your pharmacy*   Lab Work: None ordered If you have labs (blood work) drawn today and your tests are completely normal, you will receive your results only by: Marland Kitchen MyChart Message (if you have MyChart) OR . A paper copy in the mail If you have any lab test that is abnormal or we need to change your treatment, we will call you to review the results.   Testing/Procedures: Your physician has requested that you have an echocardiogram. Echocardiography is a painless test that uses sound waves to create images of your heart. It provides your doctor with information about the size and shape of your heart and how well your heart's chambers and valves are working. This procedure takes approximately one hour. There are no restrictions for this procedure.  Your physician has requested that you have a lexiscan myoview. For further information please visit [email or web address] Please follow instruction sheet, as given.  The test will take approximately 3 to 4 hours to complete; you may bring reading material.  If someone comes with you to your appointment, they will need to remain in the main lobby due to limited space in the testing area. **If you are pregnant or breastfeeding, please notify the nuclear lab prior to your appointment**  How to prepare for your Myocardial Perfusion Test: . Do not eat or drink 3 hours prior to your test, except you may have water. . Do not consume products containing caffeine (regular or decaffeinated) 12 hours prior to your test. (ex: coffee, chocolate, sodas, tea). . Do bring a list of your current medications with you.  If not listed below, you may take your medications as normal. . Do wear comfortable clothes (no dresses or overalls) and walking shoes, tennis shoes preferred (No heels or open toe shoes are allowed). . Do NOT wear  cologne, perfume, aftershave, or lotions (deodorant is allowed). . If these instructions are not followed, your test will have to be rescheduled.    Follow-Up: At [facility], you and your health needs are our priority.  As part of our continuing mission to provide you with exceptional heart care, we have created designated Provider Care Teams.  These Care Teams include your primary Cardiologist (physician) and Advanced Practice Providers (APPs -  Physician Assistants and Nurse Practitioners) who all work together to provide you with the care you need, when you need it.  We recommend signing up for the patient portal called "MyChart".  Sign up information is provided on this After Visit Summary.  MyChart is used to connect with patients for Virtual Visits (Telemedicine).  Patients are able to view lab/test results, encounter notes, upcoming appointments, etc.  Non-urgent messages can be sent to your provider as well.   To learn more about what you can do with MyChart, go to [email or web address].    Your next appointment:   1 month(s)  The format for your next appointment:   In Person  Provider:   Belva Crome, MD   Other Instructions  Cardiac Nuclear Scan  A cardiac nuclear scan is a test that is done to check the flow of blood to your heart. It is done when you are resting and when you are exercising. The test looks for problems such as:  Not enough blood reaching a portion of the heart.  The heart muscle not working as it should. You may need this test if:  You have heart disease.  You have had lab results that are not normal.  You have had heart surgery or a balloon procedure to open up blocked arteries (angioplasty).  You have chest pain.  You have shortness of breath. In this test, a special dye (tracer) is put into your bloodstream. The tracer will travel to your heart. A camera will then take pictures of your heart to see how the tracer moves through your  heart. This test is usually done at a hospital and takes 2-4 hours. Tell a doctor about:  Any allergies you have.  All medicines you are taking, including vitamins, herbs, eye drops, creams, and over-the-counter medicines.  Any problems you or family members have had with anesthetic medicines.  Any blood disorders you have.  Any surgeries you have had.  Any medical conditions you have.  Whether you are pregnant or may be pregnant. What are the risks? Generally, this is a safe test. However, problems may occur, such as:  Serious chest pain and heart attack. This is only a risk if the stress portion of the test is done.  Rapid heartbeat.  A feeling of warmth in your chest. This feeling usually does not last long.  Allergic reaction to the tracer. What happens before the test?  Ask your doctor about changing or stopping your normal medicines. This is important.  Follow instructions from your doctor about what you cannot eat or drink.  Remove your jewelry on the day of the test. What happens during the test? 1. An IV tube will be inserted into one of your veins. 2. Your doctor will give you a small amount of tracer through the IV tube. 3. You will wait for 20-40 minutes while the tracer moves through your bloodstream. 4. Your heart will be monitored with an electrocardiogram (ECG). 5. You will lie down on an exam table. 6. Pictures of your heart will be taken for about 15-20 minutes. 7. You may also have a stress test. For this test, one of these things may be done: ? You will be asked to exercise on a treadmill or a stationary bike. ? You will be given medicines that will make your heart work harder. This is done if you are unable to exercise. 8. When blood flow to your heart has peaked, a tracer will again be given through the IV tube. 9. After 20-40 minutes, you will get back on the exam table. More pictures will be taken of your heart. 10. Depending on the tracer that is  used, more pictures may need to be taken 3-4 hours later. 11. Your IV tube will be removed when the test is over. The test may vary among doctors and hospitals. What happens after the test? 1. Ask your doctor: ? Whether you can return to your normal schedule, including diet, activities, and medicines. ? Whether you should drink more fluids. This will help to remove the tracer from your body. Drink enough fluid to keep your pee (urine) pale yellow. 2. Ask your doctor, or the department that is doing the test: ? When will my results be ready? ? How will I get my results? Summary  A cardiac nuclear scan is a test that is done to check the flow of blood to your heart.  Tell your doctor whether you are pregnant or may be pregnant.  Before the test, ask your doctor about changing or stopping your normal medicines. This is important.  Ask your doctor whether you can return   to your normal activities. You may be asked to drink more fluids. This information is not intended to replace advice given to you by your health care provider. Make sure you discuss any questions you have with your health care provider. Document Revised: 01/20/2019 Document Reviewed: 03/16/2018 Elsevier Patient Education  2020 [employer]  Echocardiogram An echocardiogram is a procedure that uses painless sound waves (ultrasound) to produce an image of the heart. Images from an echocardiogram can provide important information about:  Signs of coronary artery disease (CAD).  Aneurysm detection. An aneurysm is a weak or damaged part of an artery wall that bulges out from the normal force of blood pumping through the body.  Heart size and shape. Changes in the size or shape of the heart can be associated with certain conditions, including heart failure, aneurysm, and CAD.  Heart muscle function.  Heart valve function.  Signs of a past heart attack.  Fluid buildup around the heart.  Thickening of the heart  muscle.  A tumor or infectious growth around the heart valves. Tell a health care provider about:  Any allergies you have.  All medicines you are taking, including vitamins, herbs, eye drops, creams, and over-the-counter medicines.  Any blood disorders you have.  Any surgeries you have had.  Any medical conditions you have.  Whether you are pregnant or may be pregnant. What are the risks? Generally, this is a safe procedure. However, problems may occur, including:  Allergic reaction to dye (contrast) that may be used during the procedure. What happens before the procedure? No specific preparation is needed. You may eat and drink normally. What happens during the procedure?    An IV tube may be inserted into one of your veins.  You may receive contrast through this tube. A contrast is an injection that improves the quality of the pictures from your heart.  A gel will be applied to your chest.  A wand-like tool (transducer) will be moved over your chest. The gel will help to transmit the sound waves from the transducer.  The sound waves will harmlessly bounce off of your heart to allow the heart images to be captured in real-time motion. The images will be recorded on a computer. The procedure may vary among health care providers and hospitals. What happens after the procedure?  You may return to your normal, everyday life, including diet, activities, and medicines, unless your health care provider tells you not to do that. Summary  An echocardiogram is a procedure that uses painless sound waves (ultrasound) to produce an image of the heart.  Images from an echocardiogram can provide important information about the size and shape of your heart, heart muscle function, heart valve function, and fluid buildup around your heart.  You do not need to do anything to prepare before this procedure. You may eat and drink normally.  After the echocardiogram is completed, you may  return to your normal, everyday life, unless your health care provider tells you not to do that. This information is not intended to replace advice given to you by your health care provider. Make sure you discuss any questions you have with your health care provider. Document Revised: 01/21/2019 Document Reviewed: 11/02/2016 Elsevier Patient Education  2020 [employer].  

## 2020-11-07 NOTE — Progress Notes (Signed)
Cardiology Office Note:    Date:  11/07/2020   ID:  CHEZ BULNES, DOB [birth year], MRN [id number]  PCP:  Wilmer Floor., MD  Cardiologist:  Garwin Brothers, MD   Referring MD: Wilmer Floor., MD    ASSESSMENT:    1. Coronary artery disease involving native coronary artery of native heart with angina pectoris (HCC)   2. Left bundle branch block (LBBB)   3. Dyslipidemia   4. S/P AVR (aortic valve replacement)    5. S/P aortic valve replacement with bioprosthetic valve    PLAN:    In order of problems listed above:  1. Coronary artery disease: Secondary prevention stressed with the patient.  Importance of compliance with diet medication stressed any vocalized understanding.  For DOT physical I would have preferred him to walk on a treadmill but because of left bundle-branch-like pattern I would prefer him to do a Lexiscan sestamibi.  He was advised to continue walking half an hour a day on a daily basis and he promises to do so.  With his walking currently has had no symptoms. 2. Prosthetic aortic valve: Stable at this time.  Has been more than 2 years since we had an echocardiogram and will be evaluated with an echo at this time. 3. Essential hypertension: I think it is stable at this time.  Patient is upset because of some issues at work.  I told him to relax lifestyle modification was urged he mentioned to me multiple blood pressure readings at home that were fine.  We will keep an eye on his blood pressures when he comes for the Lexiscan sestamibi.  He will complete all his medications taken in the morning and we will keep a track of his blood pressure. 4. Mixed dyslipidemia and abdominal obesity: Diet was emphasized.  Exercise and weight reduction stressed and he promises to do better.Patient will be seen in follow-up appointment in 6 months or earlier if the patient has any concerns.  I reviewed his lipids from his KPN sheet.   Medication Adjustments/Labs and Tests  Ordered: Current medicines are reviewed at length with the patient today.  Concerns regarding medicines are outlined above.  No orders of the defined types were placed in this encounter.  No orders of the defined types were placed in this encounter.    No chief complaint on file.    History of Present Illness:    Blake Griffin is a 65 y.o. male.  Patient has past medical history of essential hypertension, mixed dyslipidemia, CABG surgery for coronary artery disease and aortic valve replacement.  He denies any problems at this time and takes care of activities of daily living.  He walks on a regular basis about 15 to 20 minutes without any problems.  No chest pain orthopnea or PND.  At the time of my evaluation, the patient is alert awake oriented and in no distress.  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  He mentions to me that he is upset with his employer and his blood pressure is up.  Past Medical History:  Diagnosis Date  . Anal fissure   . Aortic stenosis   . Benign essential HTN 08/28/2017  . Bilateral carotid bruits   . COPD (chronic obstructive pulmonary disease) (HCC)   . Coronary artery disease involving native coronary artery of native heart with angina pectoris (HCC)   . Dyslipidemia   . ED (erectile dysfunction)   . Erectile dysfunction 06/29/2018  . Hemorrhoids   .  Hypertension   . Hypogonadism in male   . Left bundle branch block (LBBB) 08/06/2018  . Leg cramps   . Light headed 08/28/2017  . Pain of left shoulder region   . Petechiae   . Pneumonia 2016  . S/P aortic valve replacement with bioprosthetic valve 08/06/2018   25 mm Edwards Intuity Elite stented bovine pericardial bioprosthetic tissue valve  [id number] Model: 8300AB  . S/P AVR (aortic valve replacement)  08/06/2018   25 mm Edwards Intuity Elite stented bovine pericardial bioprosthetic tissue valve  [id number] Model: 8300AB  . 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 Smoking   .  Vitamin D deficiency     Past Surgical History:  Procedure Laterality Date  . AORTIC VALVE REPLACEMENT N/A 08/06/2018   Procedure: AORTIC VALVE REPLACEMENT (AVR);  Surgeon: Purcell Nails, MD;  Location: [facility] OR;  Service: Open Heart Surgery;  Laterality: N/A;  . APPENDECTOMY    . COLONOSCOPY W/ POLYPECTOMY     x 2  . CORONARY ARTERY BYPASS GRAFT N/A 08/06/2018   Procedure: CORONARY ARTERY BYPASS GRAFTING (CABG) times one using right greater saphenous vein harvested endoscopically and aortic valve replacement.;  Surgeon: Purcell Nails, MD;  Location: [facility] OR;  Service: Open Heart Surgery;  Laterality: N/A;  . Lung Biospy Right    lower lung  . RIGHT/LEFT HEART CATH AND CORONARY ANGIOGRAPHY N/A 06/30/2018   Procedure: RIGHT/LEFT HEART CATH AND CORONARY ANGIOGRAPHY;  Surgeon: Lyn Records, MD;  Location: [facility] INVASIVE CV LAB;  Service: Cardiovascular;  Laterality: N/A;  . TEE WITHOUT CARDIOVERSION N/A 08/06/2018   Procedure: TRANSESOPHAGEAL ECHOCARDIOGRAM (TEE);  Surgeon: Purcell Nails, MD;  Location: [facility] OR;  Service: Open Heart Surgery;  Laterality: N/A;  . TOOTH EXTRACTION N/A 07/30/2018   Procedure: Extraction of tooth #5 w/ Alveoloplasty and Gross debridement of remaining dentition.;  Surgeon: Charlynne Pander, DDS;  Location: [facility] OR;  Service: Oral Surgery;  Laterality: N/A;    Current Medications: Current Meds  Medication Sig  . albuterol (PROVENTIL HFA;VENTOLIN HFA) 108 (90 Base) MCG/ACT inhaler Inhale 2 puffs into the lungs every 6 (six) hours as needed for wheezing or shortness of breath.   Marland Kitchen aspirin EC 81 MG tablet Take 1 tablet (81 mg total) by mouth daily.  Marland Kitchen atorvastatin (LIPITOR) 40 MG tablet Take half tablet (20MG ) by mouth daily.  . D3-50 1.25 MG (50000 UT) capsule TAKE 1 CAPSULE BY MOUTH ONE TIME PER WEEK  . lisinopril-hydrochlorothiazide (ZESTORETIC) 20-12.5 MG tablet TAKE 1 TABLET BY MOUTH EVERY DAY  . metoprolol tartrate (LOPRESSOR) 25 MG tablet Take 25 mg by mouth  2 (two) times daily.  . naproxen sodium (ALEVE) 220 MG tablet Take 220 mg by mouth daily as needed (Taking 2 once a day for ache in neck).  . valACYclovir (VALTREX) 500 MG tablet Take 500 mg by mouth daily as needed (cold sores).      Allergies:   Patient has no known allergies.   Social History   Socioeconomic History  . Marital status: Married    Spouse name: Not on file  . Number of children: 5  . Years of education: Not on file  . Highest education level: Not on file  Occupational History  . Not on file  Tobacco Use  . Smoking status: Former Smoker    Packs/day: 1.00    Years: 50.00    Pack years: 50.00    Types: Cigarettes  Quit date: 08/06/2018    Years since quitting: 2.2  . Smokeless tobacco: Never Used  Vaping Use  . Vaping Use: Never used  Substance and Sexual Activity  . Alcohol use: Yes    Alcohol/week: 14.0 standard drinks    Types: 14 Shots of liquor per week    Comment: Bacardi rum daily  . Drug use: Never  . Sexual activity: Not on file  Other Topics Concern  . Not on file  Social History Narrative  . Not on file   Social Determinants of Health   Financial Resource Strain: Not on file  Food Insecurity: Not on file  Transportation Needs: Not on file  Physical Activity: Not on file  Stress: Not on file  Social Connections: Not on file     Family History: The patient's family history includes Coronary artery disease in his mother; Hypertension in his father and mother.  ROS:   Please see the history of present illness.    All other systems reviewed and are negative.  EKGs/Labs/Other Studies Reviewed:    The following studies were reviewed today: EKG reveals sinus rhythm intraventricular conduction delay of the pattern of left bundle branch block and nonspecific ST-T changes   Recent Labs: No results found for requested labs within last 8760 hours.  Recent Lipid Panel    Component Value Date/Time   CHOL 139 06/29/2018 0938   TRIG 173  (H) 06/29/2018 0938   HDL 38 (L) 06/29/2018 0938   CHOLHDL 3.7 06/29/2018 0938   LDLCALC 66 06/29/2018 0938    Physical Exam:    VS:  BP (!) 178/84   Pulse 63   Ht 5\' 11"  (1.803 m)   Wt 234 lb 6.4 oz (106.3 kg)   SpO2 94%   BMI 32.69 kg/m     Wt Readings from Last 3 Encounters:  11/07/20 234 lb 6.4 oz (106.3 kg)  06/04/19 234 lb (106.1 kg)  01/29/19 224 lb (101.6 kg)     GEN: Patient is in no acute distress HEENT: Normal NECK: No JVD; No carotid bruits LYMPHATICS: No lymphadenopathy CARDIAC: Hear sounds regular, 2/6 systolic murmur at the apex. RESPIRATORY:  Clear to auscultation without rales, wheezing or rhonchi  ABDOMEN: Soft, non-tender, non-distended MUSCULOSKELETAL:  No edema; No deformity  SKIN: Warm and dry NEUROLOGIC:  Alert and oriented x 3 PSYCHIATRIC:  Normal affect   Signed, 01/31/19, MD  11/07/2020 8:15 AM    [facility]

## 2020-11-08 ENCOUNTER — Telehealth: Payer: Self-pay | Admitting: *Deleted

## 2020-11-08 NOTE — Telephone Encounter (Signed)
Left message on voicemail per DPR in reference to upcoming appointment scheduled on 11/15/20 at 0745 with detailed instructions given per Myocardial Perfusion Study Information Sheet for the test. LM to arrive 15 minutes early, and that it is imperative to arrive on time for appointment to keep from having the test rescheduled. If you need to cancel or reschedule your appointment, please call the office within 24 hours of your appointment. Failure to do so may result in a cancellation of your appointment, and a $50 no show fee. Phone number given for call back for any questions. No mychart available.Anabell Swint, Adelene Idler

## 2020-11-15 ENCOUNTER — Ambulatory Visit (INDEPENDENT_AMBULATORY_CARE_PROVIDER_SITE_OTHER): Payer: Medicare HMO

## 2020-11-15 ENCOUNTER — Other Ambulatory Visit: Payer: Self-pay

## 2020-11-15 DIAGNOSIS — I25119 Atherosclerotic heart disease of native coronary artery with unspecified angina pectoris: Secondary | ICD-10-CM

## 2020-11-15 DIAGNOSIS — Z953 Presence of xenogenic heart valve: Secondary | ICD-10-CM

## 2020-11-15 DIAGNOSIS — I447 Left bundle-branch block, unspecified: Secondary | ICD-10-CM | POA: Diagnosis not present

## 2020-11-15 DIAGNOSIS — Z952 Presence of prosthetic heart valve: Secondary | ICD-10-CM

## 2020-11-15 LAB — MYOCARDIAL PERFUSION IMAGING
LV dias vol: 181 mL (ref 62–150)
LV sys vol: 95 mL
Peak HR: 75 {beats}/min
Rest HR: 55 {beats}/min
SDS: 5
SRS: 6
SSS: 11
TID: 1.11

## 2020-11-15 MED ORDER — TECHNETIUM TC 99M TETROFOSMIN IV KIT
30.9000 | PACK | Freq: Once | INTRAVENOUS | Status: AC | PRN
  Administered 2020-11-15: 30.9 via INTRAVENOUS

## 2020-11-15 MED ORDER — REGADENOSON 0.4 MG/5ML IV SOLN
0.4000 mg | Freq: Once | INTRAVENOUS | Status: AC
  Administered 2020-11-15: 0.4 mg via INTRAVENOUS

## 2020-11-15 MED ORDER — TECHNETIUM TC 99M TETROFOSMIN IV KIT
10.1000 | PACK | Freq: Once | INTRAVENOUS | Status: AC | PRN
  Administered 2020-11-15: 10.1 via INTRAVENOUS

## 2020-11-17 ENCOUNTER — Other Ambulatory Visit: Payer: Self-pay

## 2020-11-17 ENCOUNTER — Ambulatory Visit (INDEPENDENT_AMBULATORY_CARE_PROVIDER_SITE_OTHER): Payer: Medicare HMO | Admitting: Cardiology

## 2020-11-17 ENCOUNTER — Telehealth: Payer: Self-pay

## 2020-11-17 ENCOUNTER — Encounter: Payer: Self-pay | Admitting: Cardiology

## 2020-11-17 VITALS — BP 160/72 | HR 66 | Ht 71.0 in | Wt 232.8 lb

## 2020-11-17 DIAGNOSIS — E785 Hyperlipidemia, unspecified: Secondary | ICD-10-CM | POA: Diagnosis not present

## 2020-11-17 DIAGNOSIS — I25119 Atherosclerotic heart disease of native coronary artery with unspecified angina pectoris: Secondary | ICD-10-CM

## 2020-11-17 DIAGNOSIS — Z951 Presence of aortocoronary bypass graft: Secondary | ICD-10-CM | POA: Diagnosis not present

## 2020-11-17 DIAGNOSIS — Z952 Presence of prosthetic heart valve: Secondary | ICD-10-CM | POA: Diagnosis not present

## 2020-11-17 DIAGNOSIS — I447 Left bundle-branch block, unspecified: Secondary | ICD-10-CM

## 2020-11-17 DIAGNOSIS — Z953 Presence of xenogenic heart valve: Secondary | ICD-10-CM

## 2020-11-17 MED ORDER — NITROGLYCERIN 0.4 MG SL SUBL: 0.4000 mg | SUBLINGUAL_TABLET | SUBLINGUAL | 6 refills | Status: DC | PRN

## 2020-11-17 NOTE — H&P (View-Only) (Signed)
Cardiology Office Note:    Date:  11/17/2020   ID:  Blake Griffin, DOB [birth year], MRN [id number]  PCP:  Wilmer Floor., MD  Cardiologist:  Garwin Brothers, MD   Referring MD: Wilmer Floor., MD    ASSESSMENT:    1. Left bundle branch block (LBBB)   2. Coronary artery disease involving native coronary artery of native heart with angina pectoris (HCC)   3. S/P CABG x 1   4. S/P AVR (aortic valve replacement)    5. S/P aortic valve replacement with bioprosthetic valve   6. Dyslipidemia    PLAN:    In order of problems listed above:  1. Abnormal nuclear stress test: Angina pectoris: Coronary artery disease: I discussed my findings with the patient in extensive length.  He is a [employer].  He leads a sedentary lifestyle.  In view of the symptoms and findings following was recommended to the patient.I discussed coronary angiography and left heart catheterization with the patient at extensive length. Procedure, benefits and potential risks were explained. Patient had multiple questions which were answered to the patient's satisfaction. Patient agreed and consented for the procedure. Further recommendations will be made based on the findings of the coronary angiography. In the interim. The patient has any significant symptoms he knows to go to the nearest emergency room.  Sublingual nitroglycerin prescription was sent, its protocol and 911 protocol explained and the patient vocalized understanding questions were answered to the patient's satisfaction 2. Essential hypertension: Blood pressure stable and diet was emphasized and he vocalized understanding and promises to do better.  I think he has an element of whitecoat hypertension.  He is going to keep a track of his blood pressures at home and bring it to [place] and bring his blood pressure machine.  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 3. Aortic valve replacement: Stable at this  time. 4. Mixed dyslipidemia and abdominal obesity: Diet was emphasized.  Weight reduction was stressed risks of obesity explained and he promises to do better.  He will be seen follow-up appointment 3 to 4 weeks after the coronary angiography.   Medication Adjustments/Labs and Tests Ordered: Current medicines are reviewed at length with the patient today.  Concerns regarding medicines are outlined above.  No orders of the defined types were placed in this encounter.  No orders of the defined types were placed in this encounter.    No chief complaint on file.    History of Present Illness:    Blake Griffin is a 65 y.o. male.  Patient has past medical history of aortic stenosis aortic valve replacement and the right coronary artery.  Patient with incidental lymph node.  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  His stress test is abnormal and revealed moderate area of reversibility made.  Segment of the left ventricle.  It appears to correlate well with his recent bypass surgery.  His left coronary system was fine.  At the time of my evaluation, the patient is alert awake oriented and in no distress.  Past Medical History:  Diagnosis Date  . Anal fissure   . Aortic stenosis   . Benign essential HTN 08/28/2017  . Bilateral carotid bruits   . COPD (chronic obstructive pulmonary disease) (HCC)   . Coronary artery disease involving native coronary artery of native heart with angina pectoris (HCC)   . Dyslipidemia   . ED (erectile dysfunction)   . Erectile dysfunction 06/29/2018  .  Hemorrhoids   . Hypertension   . Hypogonadism in male   . Left bundle branch block (LBBB) 08/06/2018  . Leg cramps   . Light headed 08/28/2017  . Pain of left shoulder region   . Petechiae   . Pneumonia 2016  . S/P aortic valve replacement with bioprosthetic valve 08/06/2018   25 mm Edwards Intuity Elite stented bovine pericardial bioprosthetic tissue valve  [id number] Model: 8300AB  . S/P AVR (aortic valve  replacement)  08/06/2018   25 mm Edwards Intuity Elite stented bovine pericardial bioprosthetic tissue valve  [id number] Model: 8300AB  . 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 Smoking   . Vitamin D deficiency     Past Surgical History:  Procedure Laterality Date  . AORTIC VALVE REPLACEMENT N/A 08/06/2018   Procedure: AORTIC VALVE REPLACEMENT (AVR);  Surgeon: Purcell Nails, MD;  Location: [facility] OR;  Service: Open Heart Surgery;  Laterality: N/A;  . APPENDECTOMY    . COLONOSCOPY W/ POLYPECTOMY     x 2  . CORONARY ARTERY BYPASS GRAFT N/A 08/06/2018   Procedure: CORONARY ARTERY BYPASS GRAFTING (CABG) times one using right greater saphenous vein harvested endoscopically and aortic valve replacement.;  Surgeon: Purcell Nails, MD;  Location: [facility] OR;  Service: Open Heart Surgery;  Laterality: N/A;  . Lung Biospy Right    lower lung  . RIGHT/LEFT HEART CATH AND CORONARY ANGIOGRAPHY N/A 06/30/2018   Procedure: RIGHT/LEFT HEART CATH AND CORONARY ANGIOGRAPHY;  Surgeon: Lyn Records, MD;  Location: [facility] INVASIVE CV LAB;  Service: Cardiovascular;  Laterality: N/A;  . TEE WITHOUT CARDIOVERSION N/A 08/06/2018   Procedure: TRANSESOPHAGEAL ECHOCARDIOGRAM (TEE);  Surgeon: Purcell Nails, MD;  Location: [facility] OR;  Service: Open Heart Surgery;  Laterality: N/A;  . TOOTH EXTRACTION N/A 07/30/2018   Procedure: Extraction of tooth #5 w/ Alveoloplasty and Gross debridement of remaining dentition.;  Surgeon: Charlynne Pander, DDS;  Location: [facility] OR;  Service: Oral Surgery;  Laterality: N/A;    Current Medications: Current Meds  Medication Sig  . albuterol (PROVENTIL HFA;VENTOLIN HFA) 108 (90 Base) MCG/ACT inhaler Inhale 2 puffs into the lungs every 6 (six) hours as needed for wheezing or shortness of breath.   Marland Kitchen aspirin EC 81 MG tablet Take 1 tablet (81 mg total) by mouth daily.  Marland Kitchen atorvastatin (LIPITOR) 40 MG tablet Take half tablet (20MG ) by mouth daily.  . D3-50 1.25 MG (50000 UT)  capsule Take 50,000 Units by mouth once a week.  lisinopril-hydrochlorothiazide (ZESTORETIC) 20-12.5 MG tablet TAKE 1 TABLET BY MOUTH EVERY DAY  . metoprolol tartrate (LOPRESSOR) 25 MG tablet Take 25 mg by mouth 2 (two) times daily.  . naproxen sodium (ALEVE) 220 MG tablet Take 220 mg by mouth daily as needed (for ache in neck).  . valACYclovir (VALTREX) 500 MG tablet Take 500 mg by mouth daily as needed (cold sores).      Allergies:   Patient has no known allergies.   Social History   Socioeconomic History  . Marital status: Married    Spouse name: Not on file  . Number of children: 5  . Years of education: Not on file  . Highest education level: Not on file  Occupational History  . Not on file  Tobacco Use  . Smoking status: Former Smoker    Packs/day: 1.00    Years: 50.00    Pack years: 50.00    Types: Cigarettes    Quit  date: 08/06/2018    Years since quitting: 2.2  . Smokeless tobacco: Never Used  Vaping Use  . Vaping Use: Never used  Substance and Sexual Activity  . Alcohol use: Yes    Alcohol/week: 14.0 standard drinks    Types: 14 Shots of liquor per week    Comment: Bacardi rum daily  . Drug use: Never  . Sexual activity: Not on file  Other Topics Concern  . Not on file  Social History Narrative  . Not on file   Social Determinants of Health   Financial Resource Strain: Not on file  Food Insecurity: Not on file  Transportation Needs: Not on file  Physical Activity: Not on file  Stress: Not on file  Social Connections: Not on file     Family History: The patient's family history includes Coronary artery disease in his mother; Hypertension in his father and mother.  ROS:   Please see the history of present illness.    All other systems reviewed and are negative.  EKGs/Labs/Other Studies Reviewed:    The following studies were reviewed today: Study Highlights   The left ventricular ejection fraction is mildly decreased (45-54%).  Nuclear  stress EF: 47%.  There was no ST segment deviation noted during stress.  No T wave inversion was noted during stress.  Defect 1: There is a reversible medium defect of moderate severity present in the basal inferior and mid inferior location.  Findings consistent with ischemia.  This is an intermediate risk study.     Recent Labs: No results found for requested labs within last 8760 hours.  Recent Lipid Panel    Component Value Date/Time   CHOL 139 06/29/2018 0938   TRIG 173 (H) 06/29/2018 0938   HDL 38 (L) 06/29/2018 0938   CHOLHDL 3.7 06/29/2018 0938   LDLCALC 66 06/29/2018 0938    Physical Exam:    VS:  BP (!) 160/72   Pulse 66   Ht 5\' 11"  (1.803 m)   Wt 232 lb 12.8 oz (105.6 kg)   SpO2 93%   BMI 32.47 kg/m     Wt Readings from Last 3 Encounters:  11/17/20 232 lb 12.8 oz (105.6 kg)  11/15/20 234 lb (106.1 kg)  11/07/20 234 lb 6.4 oz (106.3 kg)     GEN: Patient is in no acute distress HEENT: Normal NECK: No JVD; No carotid bruits LYMPHATICS: No lymphadenopathy CARDIAC: Hear sounds regular, 2/6 systolic murmur at the apex. RESPIRATORY:  Clear to auscultation without rales, wheezing or rhonchi  ABDOMEN: Soft, non-tender, non-distended MUSCULOSKELETAL:  No edema; No deformity  SKIN: Warm and dry NEUROLOGIC:  Alert and oriented x 3 PSYCHIATRIC:  Normal affect   Signed, 11/09/20, MD  11/17/2020 3:14 PM    [facility]

## 2020-11-17 NOTE — Progress Notes (Signed)
Cardiology Office Note:    Date:  11/17/2020   ID:  Blake Griffin, DOB [birth year], MRN [id number]  PCP:  Wilmer Floor., MD  Cardiologist:  Garwin Brothers, MD   Referring MD: Wilmer Floor., MD    ASSESSMENT:    1. Left bundle branch block (LBBB)   2. Coronary artery disease involving native coronary artery of native heart with angina pectoris (HCC)   3. S/P CABG x 1   4. S/P AVR (aortic valve replacement)    5. S/P aortic valve replacement with bioprosthetic valve   6. Dyslipidemia    PLAN:    In order of problems listed above:  1. Abnormal nuclear stress test: Angina pectoris: Coronary artery disease: I discussed my findings with the patient in extensive length.  He is a Naval architect.  He leads a sedentary lifestyle.  In view of the symptoms and findings following was recommended to the patient.I discussed coronary angiography and left heart catheterization with the patient at extensive length. Procedure, benefits and potential risks were explained. Patient had multiple questions which were answered to the patient's satisfaction. Patient agreed and consented for the procedure. Further recommendations will be made based on the findings of the coronary angiography. In the interim. The patient has any significant symptoms he knows to go to the nearest emergency room.  Sublingual nitroglycerin prescription was sent, its protocol and 911 protocol explained and the patient vocalized understanding questions were answered to the patient's satisfaction 2. Essential hypertension: Blood pressure stable and diet was emphasized and he vocalized understanding and promises to do better.  I think he has an element of whitecoat hypertension.  He is going to keep a track of his blood pressures at home and bring it to [place] and bring his blood pressure machine.  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 3. Aortic valve replacement: Stable at this  time. 4. Mixed dyslipidemia and abdominal obesity: Diet was emphasized.  Weight reduction was stressed risks of obesity explained and he promises to do better.  He will be seen follow-up appointment 3 to 4 weeks after the coronary angiography.   Medication Adjustments/Labs and Tests Ordered: Current medicines are reviewed at length with the patient today.  Concerns regarding medicines are outlined above.  No orders of the defined types were placed in this encounter.  No orders of the defined types were placed in this encounter.    No chief complaint on file.    History of Present Illness:    Blake Griffin is a 65 y.o. male.  Patient has past medical history of aortic stenosis aortic valve replacement and the right coronary artery.  Patient with incidental lymph node.  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  His stress test is abnormal and revealed moderate area of reversibility made.  Segment of the left ventricle.  It appears to correlate well with his recent bypass surgery.  His left coronary system was fine.  At the time of my evaluation, the patient is alert awake oriented and in no distress.  Past Medical History:  Diagnosis Date  . Anal fissure   . Aortic stenosis   . Benign essential HTN 08/28/2017  . Bilateral carotid bruits   . COPD (chronic obstructive pulmonary disease) (HCC)   . Coronary artery disease involving native coronary artery of native heart with angina pectoris (HCC)   . Dyslipidemia   . ED (erectile dysfunction)   . Erectile dysfunction 06/29/2018  .  Hemorrhoids   . Hypertension   . Hypogonadism in male   . Left bundle branch block (LBBB) 08/06/2018  . Leg cramps   . Light headed 08/28/2017  . Pain of left shoulder region   . Petechiae   . Pneumonia 2016  . S/P aortic valve replacement with bioprosthetic valve 08/06/2018   25 mm Edwards Intuity Elite stented bovine pericardial bioprosthetic tissue valve  [id number] Model: 8300AB  . S/P AVR (aortic valve  replacement)  08/06/2018   25 mm Edwards Intuity Elite stented bovine pericardial bioprosthetic tissue valve  [id number] Model: 8300AB  . 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 Smoking   . Vitamin D deficiency     Past Surgical History:  Procedure Laterality Date  . AORTIC VALVE REPLACEMENT N/A 08/06/2018   Procedure: AORTIC VALVE REPLACEMENT (AVR);  Surgeon: Owen, Clarence H, MD;  Location: [facility] OR;  Service: Open Heart Surgery;  Laterality: N/A;  . APPENDECTOMY    . COLONOSCOPY W/ POLYPECTOMY     x 2  . CORONARY ARTERY BYPASS GRAFT N/A 08/06/2018   Procedure: CORONARY ARTERY BYPASS GRAFTING (CABG) times one using right greater saphenous vein harvested endoscopically and aortic valve replacement.;  Surgeon: Owen, Clarence H, MD;  Location: [facility] OR;  Service: Open Heart Surgery;  Laterality: N/A;  . Lung Biospy Right    lower lung  . RIGHT/LEFT HEART CATH AND CORONARY ANGIOGRAPHY N/A 06/30/2018   Procedure: RIGHT/LEFT HEART CATH AND CORONARY ANGIOGRAPHY;  Surgeon: Smith, Henry W, MD;  Location: [facility] INVASIVE CV LAB;  Service: Cardiovascular;  Laterality: N/A;  . TEE WITHOUT CARDIOVERSION N/A 08/06/2018   Procedure: TRANSESOPHAGEAL ECHOCARDIOGRAM (TEE);  Surgeon: Owen, Clarence H, MD;  Location: [facility] OR;  Service: Open Heart Surgery;  Laterality: N/A;  . TOOTH EXTRACTION N/A 07/30/2018   Procedure: Extraction of tooth #5 w/ Alveoloplasty and Gross debridement of remaining dentition.;  Surgeon: Kulinski, Ronald F, DDS;  Location: [facility] OR;  Service: Oral Surgery;  Laterality: N/A;    Current Medications: Current Meds  Medication Sig  . albuterol (PROVENTIL HFA;VENTOLIN HFA) 108 (90 Base) MCG/ACT inhaler Inhale 2 puffs into the lungs every 6 (six) hours as needed for wheezing or shortness of breath.   . aspirin EC 81 MG tablet Take 1 tablet (81 mg total) by mouth daily.  . atorvastatin (LIPITOR) 40 MG tablet Take half tablet (20MG) by mouth daily.  . D3-50 1.25 MG (50000 UT)  capsule Take 50,000 Units by mouth once a week.  . lisinopril-hydrochlorothiazide (ZESTORETIC) 20-12.5 MG tablet TAKE 1 TABLET BY MOUTH EVERY DAY  . metoprolol tartrate (LOPRESSOR) 25 MG tablet Take 25 mg by mouth 2 (two) times daily.  . naproxen sodium (ALEVE) 220 MG tablet Take 220 mg by mouth daily as needed (for ache in neck).  . valACYclovir (VALTREX) 500 MG tablet Take 500 mg by mouth daily as needed (cold sores).      Allergies:   Patient has no known allergies.   Social History   Socioeconomic History  . Marital status: Married    Spouse name: Not on file  . Number of children: 5  . Years of education: Not on file  . Highest education level: Not on file  Occupational History  . Not on file  Tobacco Use  . Smoking status: Former Smoker    Packs/day: 1.00    Years: 50.00    Pack years: 50.00    Types: Cigarettes    Quit   date: 08/06/2018    Years since quitting: 2.2  . Smokeless tobacco: Never Used  Vaping Use  . Vaping Use: Never used  Substance and Sexual Activity  . Alcohol use: Yes    Alcohol/week: 14.0 standard drinks    Types: 14 Shots of liquor per week    Comment: Bacardi rum daily  . Drug use: Never  . Sexual activity: Not on file  Other Topics Concern  . Not on file  Social History Narrative  . Not on file   Social Determinants of Health   Financial Resource Strain: Not on file  Food Insecurity: Not on file  Transportation Needs: Not on file  Physical Activity: Not on file  Stress: Not on file  Social Connections: Not on file     Family History: The patient's family history includes Coronary artery disease in his mother; Hypertension in his father and mother.  ROS:   Please see the history of present illness.    All other systems reviewed and are negative.  EKGs/Labs/Other Studies Reviewed:    The following studies were reviewed today: Study Highlights   The left ventricular ejection fraction is mildly decreased (45-54%).  Nuclear  stress EF: 47%.  There was no ST segment deviation noted during stress.  No T wave inversion was noted during stress.  Defect 1: There is a reversible medium defect of moderate severity present in the basal inferior and mid inferior location.  Findings consistent with ischemia.  This is an intermediate risk study.     Recent Labs: No results found for requested labs within last 8760 hours.  Recent Lipid Panel    Component Value Date/Time   CHOL 139 06/29/2018 0938   TRIG 173 (H) 06/29/2018 0938   HDL 38 (L) 06/29/2018 0938   CHOLHDL 3.7 06/29/2018 0938   LDLCALC 66 06/29/2018 0938    Physical Exam:    VS:  BP (!) 160/72   Pulse 66   Ht 5\' 11"  (1.803 m)   Wt 232 lb 12.8 oz (105.6 kg)   SpO2 93%   BMI 32.47 kg/m     Wt Readings from Last 3 Encounters:  11/17/20 232 lb 12.8 oz (105.6 kg)  11/15/20 234 lb (106.1 kg)  11/07/20 234 lb 6.4 oz (106.3 kg)     GEN: Patient is in no acute distress HEENT: Normal NECK: No JVD; No carotid bruits LYMPHATICS: No lymphadenopathy CARDIAC: Hear sounds regular, 2/6 systolic murmur at the apex. RESPIRATORY:  Clear to auscultation without rales, wheezing or rhonchi  ABDOMEN: Soft, non-tender, non-distended MUSCULOSKELETAL:  No edema; No deformity  SKIN: Warm and dry NEUROLOGIC:  Alert and oriented x 3 PSYCHIATRIC:  Normal affect   Signed, 11/09/20, MD  11/17/2020 3:14 PM    [facility]

## 2020-11-17 NOTE — Patient Instructions (Signed)
Medication Instructions:  Your physician has recommended you make the following change in your medication:   Use Nitroglycerin as needed for chest pain.  *If you need a refill on your cardiac medications before your next appointment, please call your pharmacy*   Lab Work: Your physician recommends that you have labs done in the office today. Your test included  basic metabolic panel and complete blood count.  If you have labs (blood work) drawn today and your tests are completely normal, you will receive your results only by: Marland Kitchen MyChart Message (if you have MyChart) OR . A paper copy in the mail If you have any lab test that is abnormal or we need to change your treatment, we will call you to review the results.   Testing/Procedures:    [facility] [place] [address] [place] [address] Dept: [phone] Loc: [phone]  Blake Griffin  11/17/2020  You are scheduled for a Cardiac Catheterization on Tuesday, February 8 with Dr. Nicki Guadalajara.  1. Please arrive at the [facility] (Main Entrance A) at [facility]: [address] [place], [place] [address] at 5:30 AM (This time is two hours before your procedure to ensure your preparation). Free valet parking service is available.   Special note: Every effort is made to have your procedure done on time. Please understand that emergencies sometimes delay scheduled procedures.  2. Diet: Do not eat solid foods after midnight.  The patient may have clear liquids until 5am upon the day of the procedure.  3. Labs: You had your labs done today for the cath.  4. Medication instructions in preparation for your procedure:   Contrast Allergy: No  Stop taking, Lisinopril (Zestril or Prinivil) Tuesday, February 8,  On the morning of your procedure, take your Aspirin and any morning medicines NOT listed above.  You may use sips of water.  5. Plan for one  night stay--bring personal belongings. 6. Bring a current list of your medications and current insurance cards. 7. You MUST have a responsible person to drive you home. 8. Someone MUST be with you the first 24 hours after you arrive home or your discharge will be delayed. 9. Please wear clothes that are easy to get on and off and wear slip-on shoes.  Thank you for allowing [place] to care for you!   -- [facility]    Follow-Up: At [facility], you and your health needs are our priority.  As part of our continuing mission to provide you with exceptional heart care, we have created designated Provider Care Teams.  These Care Teams include your primary Cardiologist (physician) and Advanced Practice Providers (APPs -  Physician Assistants and Nurse Practitioners) who all work together to provide you with the care you need, when you need it.  We recommend signing up for the patient portal called "MyChart".  Sign up information is provided on this After Visit Summary.  MyChart is used to connect with patients for Virtual Visits (Telemedicine).  Patients are able to view lab/test results, encounter notes, upcoming appointments, etc.  Non-urgent messages can be sent to your provider as well.   To learn more about what you can do with MyChart, go to [email or web address].    Your next appointment:   1 month(s)  The format for your next appointment:   In Person  Provider:   Belva Crome, MD   Other Instructions  Coronary Angiogram With  Stent Coronary angiogram with stent placement is a procedure to widen or open a narrow blood vessel of the heart (coronary artery). Arteries may become blocked by cholesterol buildup (plaques) in the lining of the artery wall. When a coronary artery becomes partially blocked, blood flow to that area decreases. This may lead to chest pain or a heart attack (myocardial infarction). A stent is a small piece of metal that looks like  mesh or spring. Stent placement may be done as treatment after a heart attack, or to prevent a heart attack if a blocked artery is found by a coronary angiogram. Let your health care provider know about:  Any allergies you have, including allergies to medicines or contrast dye.  All medicines you are taking, including vitamins, herbs, eye drops, creams, and over-the-counter medicines.  Any problems you or family members have had with anesthetic medicines.  Any blood disorders you have.  Any surgeries you have had.  Any medical conditions you have, including kidney problems or kidney failure.  Whether you are pregnant or may be pregnant.  Whether you are breastfeeding. What are the risks? Generally, this is a safe procedure. However, serious problems may occur, including:  Damage to nearby structures or organs, such as the heart, blood vessels, or kidneys.  A return of blockage.  Bleeding, infection, or bruising at the insertion site.  A collection of blood under the skin (hematoma) at the insertion site.  A blood clot in another part of the body.  Allergic reaction to medicines or dyes.  Bleeding into the abdomen (retroperitoneal bleeding).  Stroke (rare).  Heart attack (rare). What happens before the procedure? Staying hydrated Follow instructions from your health care provider about hydration, which may include:  Up to 2 hours before the procedure - you may continue to drink clear liquids, such as water, clear fruit juice, black coffee, and plain tea.   Eating and drinking restrictions Follow instructions from your health care provider about eating and drinking, which may include:  8 hours before the procedure - stop eating heavy meals or foods, such as meat, fried foods, or fatty foods.  6 hours before the procedure - stop eating light meals or foods, such as toast or cereal.  2 hours before the procedure - stop drinking clear liquids. Medicines Ask your health  care provider about:  Changing or stopping your regular medicines. This is especially important if you are taking diabetes medicines or blood thinners.  Taking medicines such as aspirin and ibuprofen. These medicines can thin your blood. Do not take these medicines unless your health care provider tells you to take them. ? Generally, aspirin is recommended before a thin tube, called a catheter, is passed through a blood vessel and inserted into the heart (cardiac catheterization).  Taking over-the-counter medicines, vitamins, herbs, and supplements. General instructions  Do not use any products that contain nicotine or tobacco for at least 4 weeks before the procedure. These products include cigarettes, e-cigarettes, and chewing tobacco. If you need help quitting, ask your health care provider.  Plan to have someone take you home from the hospital or clinic.  If you will be going home right after the procedure, plan to have someone with you for 24 hours.  You may have tests and imaging procedures.  Ask your health care provider: ? How your insertion site will be marked. Ask which artery will be used for the procedure. ? What steps will be taken to help prevent infection. These may include:  Removing hair at the insertion site.  Washing skin with a germ-killing soap.  Taking antibiotic medicine. What happens during the procedure?  An IV will be inserted into one of your veins.  Electrodes may be placed on your chest to monitor your heart rate during the procedure.  You will be given one or more of the following: ? A medicine to help you relax (sedative). ? A medicine to numb the area (local anesthetic) for catheter insertion.  A small incision will be made for catheter insertion.  The catheter will be inserted into an artery using a guide wire. The location may be in your groin, your wrist, or the fold of your arm (near your elbow).  An X-ray procedure (fluoroscopy) will be  used to help guide the catheter to the opening of the heart arteries.  A dye will be injected into the catheter. X-rays will be taken. The dye helps to show where any narrowing or blockages are located in the arteries.  Tell your health care provider if you have chest pain or trouble breathing.  A tiny wire will be guided to the blocked spot, and a balloon will be inflated to make the artery wider.  The stent will be expanded to crush the plaques into the wall of the vessel. The stent will hold the area open and improve the blood flow. Most stents have a drug coating to reduce the risk of the stent narrowing over time.  The artery may be made wider using a drill, laser, or other tools that remove plaques.  The catheter will be removed when the blood flow improves. The stent will stay where it was placed, and the lining of the artery will grow over it.  A bandage (dressing) will be placed on the insertion site. Pressure will be applied to stop bleeding.  The IV will be removed. This procedure may vary among health care providers and hospitals.   What happens after the procedure?  Your blood pressure, heart rate, breathing rate, and blood oxygen level will be monitored until you leave the hospital or clinic.  If the procedure is done through the leg, you will lie flat in bed for a few hours or for as long as told by your health care provider. You will be instructed not to bend or cross your legs.  The insertion site and the pulse in your foot or wrist will be checked often.  You may have more blood tests, X-rays, and a test that records the electrical activity of your heart (electrocardiogram, or ECG).  Do not drive for 24 hours if you were given a sedative during your procedure. Summary  Coronary angiogram with stent placement is a procedure to widen or open a narrowed coronary artery. This is done to treat heart problems.  Before the procedure, let your health care provider know  about all the medical conditions and surgeries you have or have had.  This is a safe procedure. However, some problems may occur, including damage to nearby structures or organs, bleeding, blood clots, or allergies.  Follow your health care provider's instructions about eating, drinking, medicines, and other lifestyle changes, such as quitting tobacco use before the procedure. This information is not intended to replace advice given to you by your health care provider. Make sure you discuss any questions you have with your health care provider. Document Revised: 04/21/2019 Document Reviewed: 04/21/2019 Elsevier Patient Education  2021 [employer].

## 2020-11-17 NOTE — Telephone Encounter (Signed)
-----   Message from Garwin Brothers, MD sent at 11/16/2020  1:41 PM EST ----- Abnormal and he will need appointment.  Make sure he takes aspirin and also has sublingual nitroglycerin handy.  Copy primary care. Garwin Brothers, MD 11/16/2020 1:41 PM

## 2020-11-17 NOTE — Telephone Encounter (Signed)
Results reviewed with Blake Griffin as per Dr. Kem Parkinson note.  Blake Griffin verbalized understanding and had no additional questions. Routed to PCP.  Blake Griffin has an appointment today 11/17/20.

## 2020-11-18 ENCOUNTER — Other Ambulatory Visit (HOSPITAL_COMMUNITY)
Admission: RE | Admit: 2020-11-18 | Discharge: 2020-11-18 | Disposition: A | Discharge status: Home / Self Care | Payer: Medicare HMO | Source: Ambulatory Visit | Attending: Cardiovascular Disease | Admitting: Cardiovascular Disease

## 2020-11-18 DIAGNOSIS — Z20822 Contact with and (suspected) exposure to covid-19: Secondary | ICD-10-CM | POA: Insufficient documentation

## 2020-11-18 DIAGNOSIS — Z01812 Encounter for preprocedural laboratory examination: Secondary | ICD-10-CM | POA: Diagnosis not present

## 2020-11-18 LAB — BASIC METABOLIC PANEL
BUN/Creatinine Ratio: 27 — ABNORMAL HIGH (ref 10–24)
BUN: 22 mg/dL (ref 8–27)
CO2: 24 mmol/L (ref 20–29)
Calcium: 9.2 mg/dL (ref 8.6–10.2)
Chloride: 106 mmol/L (ref 96–106)
Creatinine, Ser: 0.83 mg/dL (ref 0.76–1.27)
GFR calc Af Amer: 106 mL/min/{1.73_m2} (ref >59)
GFR calc non Af Amer: 92 mL/min/{1.73_m2} (ref >59)
Glucose: 92 mg/dL (ref 65–99)
Potassium: 4.7 mmol/L (ref 3.5–5.2)
Sodium: 143 mmol/L (ref 134–144)

## 2020-11-18 LAB — CBC WITH DIFFERENTIAL/PLATELET
Basophils Absolute: 0.1 10*3/uL (ref 0.0–0.2)
Basos: 1 %
EOS (ABSOLUTE): 0.2 10*3/uL (ref 0.0–0.4)
Eos: 2 %
Hematocrit: 45.3 % (ref 37.5–51.0)
Hemoglobin: 15.4 g/dL (ref 13.0–17.7)
Immature Grans (Abs): 0 10*3/uL (ref 0.0–0.1)
Immature Granulocytes: 0 %
Lymphocytes Absolute: 2.9 10*3/uL (ref 0.7–3.1)
Lymphs: 32 %
MCH: 32.4 pg (ref 26.6–33.0)
MCHC: 34 g/dL (ref 31.5–35.7)
MCV: 95 fL (ref 79–97)
Monocytes Absolute: 0.7 10*3/uL (ref 0.1–0.9)
Monocytes: 8 %
Neutrophils Absolute: 5.1 10*3/uL (ref 1.4–7.0)
Neutrophils: 57 %
Platelets: 194 10*3/uL (ref 150–450)
RBC: 4.76 x10E6/uL (ref 4.14–5.80)
RDW: 12.6 % (ref 11.6–15.4)
WBC: 8.9 10*3/uL (ref 3.4–10.8)

## 2020-11-18 LAB — SARS CORONAVIRUS 2 (TAT 6-24 HRS): SARS Coronavirus 2: NEGATIVE

## 2020-11-20 ENCOUNTER — Telehealth: Payer: Self-pay | Admitting: *Deleted

## 2020-11-20 MED ORDER — NITROGLYCERIN 0.4 MG SL SUBL: 0.4000 mg | SUBLINGUAL_TABLET | SUBLINGUAL | 6 refills | Status: DC | PRN

## 2020-11-20 NOTE — Telephone Encounter (Addendum)
Pt contacted pre-catheterization scheduled at [facility] for: Tuesday November 21, 2020 7:30 AM Verified arrival time and place: [facility] Main Entrance A [facility]) at: 5:30 AM   No solid food after midnight prior to cath, clear liquids until 5 AM day of procedure.  Hold: Lisinopril-HCT-AM of procedure  Except hold medications AM meds can be  taken pre-cath with sips of water including: ASA 81 mg   Confirmed patient has responsible adult to drive home post procedure and be with patient first 24 hours after arriving home: yes  You are allowed ONE visitor in the waiting room during the time you are at the hospital for your procedure. Both you and your visitor must wear a mask once you enter the hospital.    Reviewed procedure/mask/visitor instructions with patient.

## 2020-11-21 ENCOUNTER — Ambulatory Visit (HOSPITAL_COMMUNITY)
Admission: RE | Admit: 2020-11-21 | Discharge: 2020-11-21 | Disposition: A | Discharge status: Home / Self Care | Payer: Medicare HMO | Attending: Cardiovascular Disease | Admitting: Cardiovascular Disease

## 2020-11-21 ENCOUNTER — Encounter (HOSPITAL_COMMUNITY): Payer: Self-pay | Admitting: Cardiovascular Disease

## 2020-11-21 ENCOUNTER — Encounter (HOSPITAL_COMMUNITY)
Admission: RE | Disposition: A | Discharge status: Home / Self Care | Payer: Self-pay | Source: Home / Self Care | Attending: Cardiovascular Disease

## 2020-11-21 DIAGNOSIS — I10 Essential (primary) hypertension: Secondary | ICD-10-CM | POA: Diagnosis not present

## 2020-11-21 DIAGNOSIS — Z951 Presence of aortocoronary bypass graft: Secondary | ICD-10-CM | POA: Diagnosis not present

## 2020-11-21 DIAGNOSIS — R931 Abnormal findings on diagnostic imaging of heart and coronary circulation: Secondary | ICD-10-CM | POA: Diagnosis not present

## 2020-11-21 DIAGNOSIS — E782 Mixed hyperlipidemia: Secondary | ICD-10-CM | POA: Diagnosis not present

## 2020-11-21 DIAGNOSIS — Z952 Presence of prosthetic heart valve: Secondary | ICD-10-CM

## 2020-11-21 DIAGNOSIS — Z7982 Long term (current) use of aspirin: Secondary | ICD-10-CM | POA: Insufficient documentation

## 2020-11-21 DIAGNOSIS — I25119 Atherosclerotic heart disease of native coronary artery with unspecified angina pectoris: Secondary | ICD-10-CM | POA: Insufficient documentation

## 2020-11-21 DIAGNOSIS — I447 Left bundle-branch block, unspecified: Secondary | ICD-10-CM | POA: Diagnosis not present

## 2020-11-21 DIAGNOSIS — E669 Obesity, unspecified: Secondary | ICD-10-CM | POA: Diagnosis not present

## 2020-11-21 DIAGNOSIS — Z87891 Personal history of nicotine dependence: Secondary | ICD-10-CM | POA: Diagnosis not present

## 2020-11-21 DIAGNOSIS — Z79899 Other long term (current) drug therapy: Secondary | ICD-10-CM | POA: Insufficient documentation

## 2020-11-21 DIAGNOSIS — Z6832 Body mass index (BMI) 32.0-32.9, adult: Secondary | ICD-10-CM | POA: Insufficient documentation

## 2020-11-21 DIAGNOSIS — E785 Hyperlipidemia, unspecified: Secondary | ICD-10-CM

## 2020-11-21 DIAGNOSIS — I2510 Atherosclerotic heart disease of native coronary artery without angina pectoris: Secondary | ICD-10-CM

## 2020-11-21 DIAGNOSIS — Z953 Presence of xenogenic heart valve: Secondary | ICD-10-CM | POA: Insufficient documentation

## 2020-11-21 DIAGNOSIS — R9439 Abnormal result of other cardiovascular function study: Secondary | ICD-10-CM | POA: Diagnosis present

## 2020-11-21 HISTORY — PX: CORONARY/GRAFT ANGIOGRAPHY: CATH118237

## 2020-11-21 SURGERY — CORONARY/GRAFT ANGIOGRAPHY
Anesthesia: LOCAL

## 2020-11-21 MED ORDER — HEPARIN (PORCINE) IN NACL 1000-0.9 UT/500ML-% IV SOLN
INTRAVENOUS | Status: AC
  Filled 2020-11-21: qty 1000

## 2020-11-21 MED ORDER — DIAZEPAM 5 MG PO TABS: 5.0000 mg | ORAL_TABLET | Freq: Four times a day (QID) | ORAL | Status: DC | PRN

## 2020-11-21 MED ORDER — SODIUM CHLORIDE 0.9 % IV SOLN: INTRAVENOUS | Status: DC

## 2020-11-21 MED ORDER — SODIUM CHLORIDE 0.9 % IV SOLN: 250.0000 mL | INTRAVENOUS | Status: DC | PRN

## 2020-11-21 MED ORDER — HEPARIN (PORCINE) IN NACL 1000-0.9 UT/500ML-% IV SOLN
INTRAVENOUS | Status: DC | PRN
  Administered 2020-11-21 (×2): 500 mL

## 2020-11-21 MED ORDER — VERAPAMIL HCL 2.5 MG/ML IV SOLN
INTRAVENOUS | Status: DC | PRN
  Administered 2020-11-21: 10 mL via INTRA_ARTERIAL

## 2020-11-21 MED ORDER — ACETAMINOPHEN 325 MG PO TABS: 650.0000 mg | ORAL_TABLET | ORAL | Status: DC | PRN

## 2020-11-21 MED ORDER — LIDOCAINE HCL (PF) 1 % IJ SOLN
INTRAMUSCULAR | Status: DC | PRN
  Administered 2020-11-21: 2 mL

## 2020-11-21 MED ORDER — HEPARIN SODIUM (PORCINE) 1000 UNIT/ML IJ SOLN
INTRAMUSCULAR | Status: AC
  Filled 2020-11-21: qty 1

## 2020-11-21 MED ORDER — SODIUM CHLORIDE 0.9% FLUSH: 3.0000 mL | Freq: Two times a day (BID) | INTRAVENOUS | Status: DC

## 2020-11-21 MED ORDER — SODIUM CHLORIDE 0.9 % WEIGHT BASED INFUSION: 1.0000 mL/kg/h | INTRAVENOUS | Status: DC

## 2020-11-21 MED ORDER — MIDAZOLAM HCL 2 MG/2ML IJ SOLN
INTRAMUSCULAR | Status: AC
  Filled 2020-11-21: qty 2

## 2020-11-21 MED ORDER — ASPIRIN 81 MG PO CHEW: 81.0000 mg | CHEWABLE_TABLET | Freq: Every day | ORAL | Status: DC

## 2020-11-21 MED ORDER — SODIUM CHLORIDE 0.9 % WEIGHT BASED INFUSION
3.0000 mL/kg/h | INTRAVENOUS | Status: AC
  Administered 2020-11-21: 3 mL/kg/h via INTRAVENOUS

## 2020-11-21 MED ORDER — ASPIRIN 81 MG PO CHEW: 81.0000 mg | CHEWABLE_TABLET | ORAL | Status: DC

## 2020-11-21 MED ORDER — ATORVASTATIN CALCIUM 40 MG PO TABS
40.0000 mg | ORAL_TABLET | Freq: Every day | ORAL | Status: DC
  Filled 2020-11-21: qty 1

## 2020-11-21 MED ORDER — FENTANYL CITRATE (PF) 100 MCG/2ML IJ SOLN
INTRAMUSCULAR | Status: AC
  Filled 2020-11-21: qty 2

## 2020-11-21 MED ORDER — LIDOCAINE HCL (PF) 1 % IJ SOLN
INTRAMUSCULAR | Status: AC
  Filled 2020-11-21: qty 30

## 2020-11-21 MED ORDER — ONDANSETRON HCL 4 MG/2ML IJ SOLN: 4.0000 mg | Freq: Four times a day (QID) | INTRAMUSCULAR | Status: DC | PRN

## 2020-11-21 MED ORDER — FENTANYL CITRATE (PF) 100 MCG/2ML IJ SOLN
INTRAMUSCULAR | Status: DC | PRN
  Administered 2020-11-21: 25 ug via INTRAVENOUS

## 2020-11-21 MED ORDER — SODIUM CHLORIDE 0.9% FLUSH: 3.0000 mL | INTRAVENOUS | Status: DC | PRN

## 2020-11-21 MED ORDER — IOHEXOL 350 MG/ML SOLN
INTRAVENOUS | Status: DC | PRN
  Administered 2020-11-21: 60 mL

## 2020-11-21 MED ORDER — HYDRALAZINE HCL 20 MG/ML IJ SOLN: 10.0000 mg | INTRAMUSCULAR | Status: DC | PRN

## 2020-11-21 MED ORDER — MIDAZOLAM HCL 2 MG/2ML IJ SOLN
INTRAMUSCULAR | Status: DC | PRN
  Administered 2020-11-21: 2 mg via INTRAVENOUS

## 2020-11-21 MED ORDER — LABETALOL HCL 5 MG/ML IV SOLN: 10.0000 mg | INTRAVENOUS | Status: DC | PRN

## 2020-11-21 MED ORDER — HEPARIN SODIUM (PORCINE) 1000 UNIT/ML IJ SOLN
INTRAMUSCULAR | Status: DC | PRN
  Administered 2020-11-21: 5000 [IU] via INTRAVENOUS

## 2020-11-21 MED ORDER — VERAPAMIL HCL 2.5 MG/ML IV SOLN
INTRAVENOUS | Status: AC
  Filled 2020-11-21: qty 2

## 2020-11-21 SURGICAL SUPPLY — 11 items
BAG SNAP BAND KOVER 36X36 (MISCELLANEOUS) ×1 IMPLANT
CATH OPTITORQUE TIG 4.0 5F (CATHETERS) ×1 IMPLANT
COVER DOME SNAP 22 D (MISCELLANEOUS) ×1 IMPLANT
DEVICE RAD COMP TR BAND LRG (VASCULAR PRODUCTS) ×1 IMPLANT
GLIDESHEATH SLEND SS 6F .021 (SHEATH) ×1 IMPLANT
GUIDEWIRE INQWIRE 1.5J.035X260 (WIRE) IMPLANT
INQWIRE 1.5J .035X260CM (WIRE) ×2
KIT HEART LEFT (KITS) ×2 IMPLANT
PACK CARDIAC CATHETERIZATION (CUSTOM PROCEDURE TRAY) ×2 IMPLANT
TRANSDUCER W/STOPCOCK (MISCELLANEOUS) ×2 IMPLANT
TUBING CIL FLEX 10 FLL-RA (TUBING) ×2 IMPLANT

## 2020-11-21 NOTE — Interval H&P Note (Signed)
Cath Lab Visit (complete for each Cath Lab visit)  Clinical Evaluation Leading to the Procedure:   ACS: No.  Non-ACS:    Anginal Classification: CCS I  Anti-ischemic medical therapy: Minimal Therapy (1 class of medications)  Non-Invasive Test Results: Intermediate-risk stress test findings: cardiac mortality 1-3%/year  Prior CABG: Previous CABG      History and Physical Interval Note:  11/21/2020 7:40 AM  Blake Griffin  has presented today for surgery, with the diagnosis of abnormal stress test,angina.  The various methods of treatment have been discussed with the patient and family. After consideration of risks, benefits and other options for treatment, the patient has consented to  Procedure(s): LEFT HEART CATH AND CORS/GRAFTS ANGIOGRAPHY (N/A) as a surgical intervention.  The patient's history has been reviewed, patient examined, no change in status, stable for surgery.  I have reviewed the patient's chart and labs.  Questions were answered to the patient's satisfaction.     Nicki Guadalajara

## 2020-11-21 NOTE — Discharge Instructions (Signed)
Radial Site Care  This sheet gives you information about how to care for yourself after your procedure. Your health care provider may also give you more specific instructions. If you have problems or questions, contact your health care provider. What can I expect after the procedure? After the procedure, it is common to have:  Bruising and tenderness at the catheter insertion area. Follow these instructions at home: Medicines  Take over-the-counter and prescription medicines only as told by your health care provider. Insertion site care  Follow instructions from your health care provider about how to take care of your insertion site. Make sure you: ? Wash your hands with soap and water before you change your bandage (dressing). If soap and water are not available, use hand sanitizer. ? Change your dressing as told by your health care provider. ? Leave stitches (sutures), skin glue, or adhesive strips in place. These skin closures may need to stay in place for 2 weeks or longer. If adhesive strip edges start to loosen and curl up, you may trim the loose edges. Do not remove adhesive strips completely unless your health care provider tells you to do that.  Check your insertion site every day for signs of infection. Check for: ? Redness, swelling, or pain. ? Fluid or blood. ? Pus or a bad smell. ? Warmth.  Do not take baths, swim, or use a hot tub until your health care provider approves.  You may shower 24-48 hours after the procedure, or as directed by your health care provider. ? Remove the dressing and gently wash the site with plain soap and water. ? Pat the area dry with a clean towel. ? Do not rub the site. That could cause bleeding.  Do not apply powder or lotion to the site. Activity  For 24 hours after the procedure, or as directed by your health care provider: ? Do not flex or bend the affected arm. ? Do not push or pull heavy objects with the affected arm. ? Do not drive  yourself home from the hospital or clinic. You may drive 24 hours after the procedure unless your health care provider tells you not to. ? Do not operate machinery or power tools.  Do not lift anything that is heavier than 10 lb (4.5 kg), or the limit that you are told, until your health care provider says that it is safe.  Ask your health care provider when it is okay to: ? Return to work or school. ? Resume usual physical activities or sports. ? Resume sexual activity.   General instructions  If the catheter site starts to bleed, raise your arm and put firm pressure on the site. If the bleeding does not stop, get help right away. This is a medical emergency.  If you went home on the same day as your procedure, a responsible adult should be with you for the first 24 hours after you arrive home.  Keep all follow-up visits as told by your health care provider. This is important. Contact a health care provider if:  You have a fever.  You have redness, swelling, or yellow drainage around your insertion site. Get help right away if:  You have unusual pain at the radial site.  The catheter insertion area swells very fast.  The insertion area is bleeding, and the bleeding does not stop when you hold steady pressure on the area.  Your arm or hand becomes pale, cool, tingly, or numb. These symptoms may represent a serious   problem that is an emergency. Do not wait to see if the symptoms will go away. Get medical help right away. Call your local emergency services (911 in the [place] Do not drive yourself to the hospital. Summary  After the procedure, it is common to have bruising and tenderness at the site.  Follow instructions from your health care provider about how to take care of your radial site wound. Check the wound every day for signs of infection.  Do not lift anything that is heavier than 10 lb (4.5 kg), or the limit that you are told, until your health care provider says that it  is safe. This information is not intended to replace advice given to you by your health care provider. Make sure you discuss any questions you have with your health care provider. Document Revised: 11/05/2017 Document Reviewed: 11/05/2017 Elsevier Patient Education  2021 Elsevier Inc.  

## 2020-11-30 NOTE — Telephone Encounter (Signed)
Blake Griffin is calling stating he is currently at the urgent care and they have not received the fax. He is needing it resent to the fax number [phone]. Please advise.

## 2020-12-01 ENCOUNTER — Other Ambulatory Visit: Payer: Self-pay

## 2020-12-01 ENCOUNTER — Ambulatory Visit (INDEPENDENT_AMBULATORY_CARE_PROVIDER_SITE_OTHER): Payer: Medicare HMO

## 2020-12-01 DIAGNOSIS — I25119 Atherosclerotic heart disease of native coronary artery with unspecified angina pectoris: Secondary | ICD-10-CM

## 2020-12-01 DIAGNOSIS — I447 Left bundle-branch block, unspecified: Secondary | ICD-10-CM | POA: Diagnosis not present

## 2020-12-01 DIAGNOSIS — Z952 Presence of prosthetic heart valve: Secondary | ICD-10-CM | POA: Diagnosis not present

## 2020-12-01 DIAGNOSIS — Z953 Presence of xenogenic heart valve: Secondary | ICD-10-CM | POA: Diagnosis not present

## 2020-12-01 LAB — ECHOCARDIOGRAM COMPLETE
AR max vel: 0.82 cm2
AV Area VTI: 0.83 cm2
AV Area mean vel: 0.83 cm2
AV Mean grad: 11 mmHg
AV Peak grad: 22.1 mmHg
Ao pk vel: 2.35 m/s
Area-P 1/2: 3.08 cm2
Calc EF: 46.8 %
S' Lateral: 3.8 cm
Single Plane A2C EF: 51.7 %
Single Plane A4C EF: 45 %

## 2020-12-01 NOTE — Progress Notes (Signed)
Complete echocardiogram performed.  Jimmy Nicko Daher RDCS, RVT  

## 2020-12-12 ENCOUNTER — Other Ambulatory Visit: Payer: Self-pay | Admitting: Cardiology

## 2020-12-21 ENCOUNTER — Encounter: Payer: Self-pay | Admitting: Cardiology

## 2020-12-21 ENCOUNTER — Ambulatory Visit (INDEPENDENT_AMBULATORY_CARE_PROVIDER_SITE_OTHER): Payer: Medicare HMO | Admitting: Cardiology

## 2020-12-21 ENCOUNTER — Other Ambulatory Visit: Payer: Self-pay

## 2020-12-21 VITALS — BP 168/82 | HR 60 | Ht 71.0 in | Wt 236.4 lb

## 2020-12-21 DIAGNOSIS — F17200 Nicotine dependence, unspecified, uncomplicated: Secondary | ICD-10-CM

## 2020-12-21 DIAGNOSIS — Z952 Presence of prosthetic heart valve: Secondary | ICD-10-CM | POA: Diagnosis not present

## 2020-12-21 DIAGNOSIS — Z953 Presence of xenogenic heart valve: Secondary | ICD-10-CM

## 2020-12-21 DIAGNOSIS — I447 Left bundle-branch block, unspecified: Secondary | ICD-10-CM

## 2020-12-21 DIAGNOSIS — F17210 Nicotine dependence, cigarettes, uncomplicated: Secondary | ICD-10-CM

## 2020-12-21 DIAGNOSIS — E785 Hyperlipidemia, unspecified: Secondary | ICD-10-CM | POA: Diagnosis not present

## 2020-12-21 DIAGNOSIS — I25119 Atherosclerotic heart disease of native coronary artery with unspecified angina pectoris: Secondary | ICD-10-CM

## 2020-12-21 MED ORDER — RANOLAZINE ER 500 MG PO TB12: 500.0000 mg | ORAL_TABLET | Freq: Two times a day (BID) | ORAL | 0 refills | Status: DC

## 2020-12-21 MED ORDER — RANOLAZINE ER 1000 MG PO TB12: 1000.0000 mg | ORAL_TABLET | Freq: Two times a day (BID) | ORAL | 3 refills | Status: DC

## 2020-12-21 NOTE — Patient Instructions (Signed)
Medication Instructions:  Your physician has recommended you make the following change in your medication:   Start Ranexa 500 mg twice a day for 2 weeks then increase to 1000 mg twice a day.  *If you need a refill on your cardiac medications before your next appointment, please call your pharmacy*   Lab Work: Your physician recommends that you return for lab work in: before your next visit. You need to have labs done when you are fasting.  You can come Monday through Friday 8:30 am to 12:00 pm and 1:15 to 4:30. You do not need to make an appointment as the order has already been placed. The labs you are going to have done are BMET, CBC, TSH, LFT and Lipids.  If you have labs (blood work) drawn today and your tests are completely normal, you will receive your results only by: Marland Kitchen MyChart Message (if you have MyChart) OR . A paper copy in the mail If you have any lab test that is abnormal or we need to change your treatment, we will call you to review the results.   Testing/Procedures: None ordered   Follow-Up: At [facility], you and your health needs are our priority.  As part of our continuing mission to provide you with exceptional heart care, we have created designated Provider Care Teams.  These Care Teams include your primary Cardiologist (physician) and Advanced Practice Providers (APPs -  Physician Assistants and Nurse Practitioners) who all work together to provide you with the care you need, when you need it.  We recommend signing up for the patient portal called "MyChart".  Sign up information is provided on this After Visit Summary.  MyChart is used to connect with patients for Virtual Visits (Telemedicine).  Patients are able to view lab/test results, encounter notes, upcoming appointments, etc.  Non-urgent messages can be sent to your provider as well.   To learn more about what you can do with MyChart, go to [email or web address].    Your next appointment:   3  month(s)  The format for your next appointment:   In Person  Provider:   Belva Crome, MD   Other Instructions Ranolazine tablets, extended release What is this medicine? RANOLAZINE (ra NOE la zeen) is a heart medicine. It is used to treat chronic chest pain (angina). This medicine must be taken regularly. It will not relieve an acute episode of chest pain. This medicine may be used for other purposes; ask your health care provider or pharmacist if you have questions. COMMON BRAND NAME(S): Ranexa What should I tell my health care provider before I take this medicine? They need to know if you have any of these conditions:  heart disease  irregular heartbeat  kidney disease  liver disease  low levels of potassium or magnesium in the blood  an unusual or allergic reaction to ranolazine, other medicines, foods, dyes, or preservatives  pregnant or trying to get pregnant  breast-feeding How should I use this medicine? Take this medicine by mouth with a glass of water. Follow the directions on the prescription label. Do not cut, crush, or chew this medicine. Take with or without food. Do not take this medication with grapefruit juice. Take your doses at regular intervals. Do not take your medicine more often then directed. Talk to your pediatrician regarding the use of this medicine in children. Special care may be needed. Overdosage: If you think you have taken too much of this medicine contact a poison control center or emergency  room at once. NOTE: This medicine is only for you. Do not share this medicine with others. What if I miss a dose? If you miss a dose, take it as soon as you can. If it is almost time for your next dose, take only that dose. Do not take double or extra doses. What may interact with this medicine? Do not take this medicine with any of the following medications:  antivirals for HIV or AIDS  cerivastatin  certain antibiotics like chloramphenicol,  clarithromycin, dalfopristin; quinupristin, isoniazid, rifabutin, rifampin, rifapentine  certain medicines used for cancer like imatinib, nilotinib  certain medicines for fungal infections like fluconazole, itraconazole, ketoconazole, posaconazole, voriconazole  certain medicines for irregular heart beat like dronedarone  certain medicines for seizures like carbamazepine, fosphenytoin, oxcarbazepine, phenobarbital, phenytoin  cisapride  conivaptan  cyclosporine  grapefruit or grapefruit juice  lumacaftor; ivacaftor  nefazodone  pimozide  quinacrine  St John's wort  thioridazine This medicine may also interact with the following medications:  alfuzosin  certain medicines for depression, anxiety, or psychotic disturbances like bupropion, citalopram, fluoxetine, fluphenazine, paroxetine, perphenazine, risperidone, sertraline, trifluoperazine  certain medicines for cholesterol like atorvastatin, lovastatin, simvastatin  certain medicines for stomach problems like octreotide, palonosetron, prochlorperazine  eplerenone  ergot alkaloids like dihydroergotamine, ergonovine, ergotamine, methylergonovine  metformin  nicardipine  other medicines that prolong the QT interval (cause an abnormal heart rhythm) like dofetilide, ziprasidone  sirolimus  tacrolimus This list may not describe all possible interactions. Give your health care provider a list of all the medicines, herbs, non-prescription drugs, or dietary supplements you use. Also tell them if you smoke, drink alcohol, or use illegal drugs. Some items may interact with your medicine. What should I watch for while using this medicine? Visit your doctor for regular check ups. Tell your doctor or healthcare professional if your symptoms do not start to get better or if they get worse. This medicine will not relieve an acute attack of angina or chest pain. This medicine can change your heart rhythm. Your health care  provider may check your heart rhythm by ordering an electrocardiogram (ECG) while you are taking this medicine. You may get drowsy or dizzy. Do not drive, use machinery, or do anything that needs mental alertness until you know how this medicine affects you. Do not stand or sit up quickly, especially if you are an older patient. This reduces the risk of dizzy or fainting spells. Alcohol may interfere with the effect of this medicine. Avoid alcoholic drinks. If you are scheduled for any medical or dental procedure, tell your healthcare provider that you are taking this medicine. This medicine can interact with other medicines used during surgery. What side effects may I notice from receiving this medicine? Side effects that you should report to your doctor or health care professional as soon as possible:  allergic reactions like skin rash, itching or hives, swelling of the face, lips, or tongue  breathing problems  changes in vision  fast, irregular or pounding heartbeat  feeling faint or lightheaded, falls  low or high blood pressure  numbness or tingling feelings  ringing in the ears  tremor or shakiness  slow heartbeat (fewer than 50 beats per minute)  swelling of the legs or feet Side effects that usually do not require medical attention (report to your doctor or health care professional if they continue or are bothersome):  constipation  drowsy  dry mouth  headache  nausea or vomiting  stomach upset This list may not describe all  possible side effects. Call your doctor for medical advice about side effects. You may report side effects to FDA at 1-800-FDA-1088. Where should I keep my medicine? Keep out of the reach of children. Store at room temperature between 15 and 30 degrees C (59 and 86 degrees F). Throw away any unused medicine after the expiration date. NOTE: This sheet is a summary. It may not cover all possible information. If you have questions about this  medicine, talk to your doctor, pharmacist, or health care provider.  2021 Elsevier/Gold Standard (2018-09-22 09:18:49)

## 2020-12-21 NOTE — Progress Notes (Addendum)
Cardiology Office Note:    Date:  12/21/2020   ID:  Blake Griffin, DOB Mar 23, 1956, MRN [id number]  PCP:  Wilmer Floor., MD  Cardiologist:  Garwin Brothers, MD   Referring MD: Wilmer Floor., MD    ASSESSMENT:    1. Coronary artery disease involving native coronary artery of native heart with angina pectoris (HCC)   2. Left bundle branch block (LBBB)   3. Dyslipidemia   4. S/P AVR (aortic valve replacement)    5. Smoking   6. S/P aortic valve replacement with bioprosthetic valve    PLAN:    In order of problems listed above:  1. Coronary artery disease: Secondary prevention stressed with patient.  Importance of compliance with diet medication stressed any vocalized understanding.  Coronary angiography report is detailed below.  This was explained to him at length and questions were answered to his satisfaction. 2. Essential hypertension: Blood pressure stable and diet was emphasized.  He is upset this morning because of certain issues with his grandson.  His blood pressure is up.  He mentions to me that his blood pressure is fine at home and mention to me the readings.  I am satisfied. 3. Mixed dyslipidemia: Lipids were reviewed from recent KPN sheet.  Diet was emphasized.  We will recheck him in 3 months when he comes for follow-up.  This will be before his next visit. 4. Obesity: Weight reduction was stressed risks of obesity explained and he plans to do better.  Exercise emphasized.  I told him to walk at least half an hour a day 5 days a week and he promises to do so. 5. Stable angina pectoris: I have discussed with him Ranexa and is agreeable.  We will do 500 mg twice daily for 2 weeks and mammogram twice daily after that.  He will have EKG today.  Benefits and potential risks of the medication explained and he vocalized understanding and questions were answered to his satisfaction. 6. Cigarette smoker: I spent 5 minutes with the patient discussing solely about smoking.  Smoking cessation was counseled. I suggested to the patient also different medications and pharmacological interventions. Patient is keen to try stopping on its own at this time. He will get back to me if he needs any further assistance in this matter. 7. Patient will be seen in follow-up appointment in 6 months or earlier if the patient has any concerns    Medication Adjustments/Labs and Tests Ordered: Current medicines are reviewed at length with the patient today.  Concerns regarding medicines are outlined above.  Orders Placed This Encounter  Procedures  . EKG 12-Lead   Meds ordered this encounter  Medications  . ranolazine (RANEXA) 500 MG 12 hr tablet    Sig: Take 1 tablet (500 mg total) by mouth 2 (two) times daily.    Dispense:  28 tablet    Refill:  0  . ranolazine (RANEXA) 1000 MG SR tablet    Sig: Take 1 tablet (1,000 mg total) by mouth 2 (two) times daily.    Dispense:  60 tablet    Refill:  3     No chief complaint on file.    History of Present Illness:    Blake Griffin is a 65 y.o. male.  Patient has past medical history of coronary artery disease, essential hypertension, dyslipidemia, COPD and abdominal obesity.  He denies any problems at this time and takes care of activities of daily living.  No chest pain orthopnea  or PND.  He leads a sedentary lifestyle.  He mentions to me that he has not been eating well and has not been compliant with medical advice.  At the time of my evaluation, the patient is alert awake oriented and in no distress.  Past Medical History:  Diagnosis Date  . Abnormal nuclear cardiac imaging test   . Anal fissure   . Aortic stenosis   . Benign essential HTN 08/28/2017  . Bilateral carotid bruits   . COPD (chronic obstructive pulmonary disease) (HCC)   . Coronary artery disease involving native coronary artery of native heart with angina pectoris (HCC)   . Dyslipidemia   . ED (erectile dysfunction)   . Erectile dysfunction 06/29/2018  .  Hemorrhoids   . Hypertension   . Hypogonadism in male   . Left bundle branch block (LBBB) 08/06/2018  . Leg cramps   . Light headed 08/28/2017  . Pain of left shoulder region   . Petechiae   . Pneumonia 2016  . S/P aortic valve replacement with bioprosthetic valve 08/06/2018   25 mm Edwards Intuity Elite stented bovine pericardial bioprosthetic tissue valve  [id number] Model: 8300AB  . S/P AVR (aortic valve replacement)  08/06/2018   25 mm Edwards Intuity Elite stented bovine pericardial bioprosthetic tissue valve  [id number] Model: 8300AB  . 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 Smoking   . Vitamin D deficiency     Past Surgical History:  Procedure Laterality Date  . AORTIC VALVE REPLACEMENT N/A 08/06/2018   Procedure: AORTIC VALVE REPLACEMENT (AVR);  Surgeon: Purcell Nailswen, Clarence H, MD;  [facility];  Service: Open Heart Surgery;  Laterality: N/A;  . APPENDECTOMY    . COLONOSCOPY W/ POLYPECTOMY     x 2  . CORONARY ARTERY BYPASS GRAFT N/A 08/06/2018   Procedure: CORONARY ARTERY BYPASS GRAFTING (CABG) times one using right greater saphenous vein harvested endoscopically and aortic valve replacement.;  Surgeon: Purcell Nailswen, Clarence H, MD;  [facility];  Service: Open Heart Surgery;  Laterality: N/A;  . CORONARY/GRAFT ANGIOGRAPHY N/A 11/21/2020   Procedure: CORONARY/GRAFT ANGIOGRAPHY;  Surgeon: Lennette BihariKelly, Thomas A, MD;  [facility];  Service: Cardiovascular;  Laterality: N/A;  . Lung Biospy Right    lower lung  . RIGHT/LEFT HEART CATH AND CORONARY ANGIOGRAPHY N/A 06/30/2018   Procedure: RIGHT/LEFT HEART CATH AND CORONARY ANGIOGRAPHY;  Surgeon: Lyn RecordsSmith, Henry W, MD;  Location: [facility] INVASIVE CV LAB;  Service: Cardiovascular;  Laterality: N/A;  . TEE WITHOUT CARDIOVERSION N/A 08/06/2018   Procedure: TRANSESOPHAGEAL ECHOCARDIOGRAM (TEE);  Surgeon: Purcell Nailswen, Clarence H, MD;  [facility];  Service: Open Heart Surgery;  Laterality: N/A;  . TOOTH EXTRACTION N/A  07/30/2018   Procedure: Extraction of tooth #5 w/ Alveoloplasty and Gross debridement of remaining dentition.;  Surgeon: Charlynne PanderKulinski, Ronald F, DDS;  Location: [facility] OR;  Service: Oral Surgery;  Laterality: N/A;    Current Medications: Current Meds  Medication Sig  . albuterol (PROVENTIL HFA;VENTOLIN HFA) 108 (90 Base) MCG/ACT inhaler Inhale 2 puffs into the lungs every 6 (six) hours as needed for wheezing or shortness of breath.   Marland Kitchen. aspirin EC 81 MG tablet Take 1 tablet (81 mg total) by mouth daily.  Marland Kitchen. atorvastatin (LIPITOR) 40 MG tablet Take half tablet (20MG ) by mouth daily.  . D3-50 1.25 MG (50000 UT) capsule Take 50,000 Units by mouth once a week.  Marland Kitchen. lisinopril-hydrochlorothiazide (ZESTORETIC) 20-12.5 MG tablet TAKE 1 TABLET BY MOUTH  EVERY DAY  . metoprolol tartrate (LOPRESSOR) 25 MG tablet Take 25 mg by mouth 2 (two) times daily.  . naproxen sodium (ALEVE) 220 MG tablet Take 220 mg by mouth daily as needed (pain).  . nitroGLYCERIN (NITROSTAT) 0.4 MG SL tablet Place 0.4 mg under the tongue every 5 (five) minutes as needed for chest pain.  . ranolazine (RANEXA) 1000 MG SR tablet Take 1 tablet (1,000 mg total) by mouth 2 (two) times daily.  . ranolazine (RANEXA) 500 MG 12 hr tablet Take 1 tablet (500 mg total) by mouth 2 (two) times daily.  . valACYclovir (VALTREX) 500 MG tablet Take 500 mg by mouth daily as needed (cold sores).      Allergies:   Patient has no known allergies.   Social History   Socioeconomic History  . Marital status: Married    Spouse name: Not on file  . Number of children: 5  . Years of education: Not on file  . Highest education level: Not on file  Occupational History  . Not on file  Tobacco Use  . Smoking status: Former Smoker    Packs/day: 1.00    Years: 50.00    Pack years: 50.00    Types: Cigarettes    Quit date: 08/06/2018    Years since quitting: 2.3  . Smokeless tobacco: Never Used  Vaping Use  . Vaping Use: Never used  Substance and Sexual  Activity  . Alcohol use: Yes    Alcohol/week: 14.0 standard drinks    Types: 14 Shots of liquor per week    Comment: Bacardi rum daily  . Drug use: Never  . Sexual activity: Not on file  Other Topics Concern  . Not on file  Social History Narrative  . Not on file   Social Determinants of Health   Financial Resource Strain: Not on file  Food Insecurity: Not on file  Transportation Needs: Not on file  Physical Activity: Not on file  Stress: Not on file  Social Connections: Not on file     Family History: The patient's family history includes Coronary artery disease in his mother; Hypertension in his father and mother.  ROS:   Please see the history of present illness.    All other systems reviewed and are negative.  EKGs/Labs/Other Studies Reviewed:    The following studies were reviewed today: Lennette Bihari, MD (Primary)      Procedures  CORONARY/GRAFT ANGIOGRAPHY   Conclusion    Prox LAD to Mid LAD lesion is 20% stenosed.  Prox RCA to Mid RCA lesion is 70% stenosed.  Prox RCA lesion is 80% stenosed.   Well-seated bovine pericardial tissue valve.  Mild smooth 20% narrowing in the LAD.  Normal left circumflex coronary artery  Native RCA stenosis with 80% proximal and 70% mid stenosis with a large widely patent SVG anastomosing into the RCA prior to the PDA takeoff with his visualization of the entire native RCA via the graft injection.  RECOMMENDATION: Medical therapy with continued lipid-lowering therapy, aspirin, and optimal blood pressure control      Recent Labs: 11/17/2020: BUN 22; Creatinine, Ser 0.83; Hemoglobin 15.4; Platelets 194; Potassium 4.7; Sodium 143  Recent Lipid Panel    Component Value Date/Time   CHOL 139 06/29/2018 0938   TRIG 173 (H) 06/29/2018 0938   HDL 38 (L) 06/29/2018 0938   CHOLHDL 3.7 06/29/2018 0938   LDLCALC 66 06/29/2018 0938    Physical Exam:    VS:  BP (!) 168/82  Pulse 60   Ht 5\' 11"  (1.803 m)   Wt  236 lb 6.4 oz (107.2 kg)   SpO2 94%   BMI 32.97 kg/m     Wt Readings from Last 3 Encounters:  12/21/20 236 lb 6.4 oz (107.2 kg)  11/21/20 232 lb (105.2 kg)  11/17/20 232 lb 12.8 oz (105.6 kg)     GEN: Patient is in no acute distress HEENT: Normal NECK: No JVD; No carotid bruits LYMPHATICS: No lymphadenopathy CARDIAC: Hear sounds regular, 2/6 systolic murmur at the apex. RESPIRATORY:  Clear to auscultation without rales, wheezing or rhonchi  ABDOMEN: Soft, non-tender, non-distended MUSCULOSKELETAL:  No edema; No deformity  SKIN: Warm and dry NEUROLOGIC:  Alert and oriented x 3 PSYCHIATRIC:  Normal affect   Signed, 01/15/21, MD  12/21/2020 9:13 AM     [facility]

## 2021-01-04 DIAGNOSIS — L570 Actinic keratosis: Secondary | ICD-10-CM | POA: Diagnosis not present

## 2021-02-01 DIAGNOSIS — Z79899 Other long term (current) drug therapy: Secondary | ICD-10-CM | POA: Diagnosis not present

## 2021-02-01 DIAGNOSIS — E785 Hyperlipidemia, unspecified: Secondary | ICD-10-CM | POA: Diagnosis not present

## 2021-02-01 DIAGNOSIS — Z6841 Body Mass Index (BMI) 40.0 and over, adult: Secondary | ICD-10-CM | POA: Diagnosis not present

## 2021-02-01 DIAGNOSIS — I10 Essential (primary) hypertension: Secondary | ICD-10-CM | POA: Diagnosis not present

## 2021-02-01 DIAGNOSIS — E559 Vitamin D deficiency, unspecified: Secondary | ICD-10-CM | POA: Diagnosis not present

## 2021-02-01 DIAGNOSIS — K429 Umbilical hernia without obstruction or gangrene: Secondary | ICD-10-CM | POA: Diagnosis not present

## 2021-02-01 DIAGNOSIS — J449 Chronic obstructive pulmonary disease, unspecified: Secondary | ICD-10-CM | POA: Diagnosis not present

## 2021-02-01 DIAGNOSIS — I350 Nonrheumatic aortic (valve) stenosis: Secondary | ICD-10-CM | POA: Diagnosis not present

## 2021-02-12 MED ORDER — LISINOPRIL-HYDROCHLOROTHIAZIDE 20-12.5 MG PO TABS: 1.0000 | ORAL_TABLET | Freq: Every day | ORAL | 3 refills | Status: DC

## 2021-02-12 MED ORDER — RANOLAZINE ER 1000 MG PO TB12: 1000.0000 mg | ORAL_TABLET | Freq: Two times a day (BID) | ORAL | 3 refills | Status: DC

## 2021-02-12 MED ORDER — ATORVASTATIN CALCIUM 40 MG PO TABS: ORAL_TABLET | ORAL | 3 refills | Status: DC

## 2021-02-12 MED ORDER — METOPROLOL TARTRATE 25 MG PO TABS: 25.0000 mg | ORAL_TABLET | Freq: Two times a day (BID) | ORAL | 3 refills | Status: DC

## 2021-02-12 MED ORDER — ASPIRIN EC 81 MG PO TBEC: 81.0000 mg | DELAYED_RELEASE_TABLET | Freq: Every day | ORAL | 3 refills | Status: AC

## 2021-03-29 ENCOUNTER — Encounter: Payer: Self-pay | Admitting: Cardiology

## 2021-03-29 ENCOUNTER — Ambulatory Visit (INDEPENDENT_AMBULATORY_CARE_PROVIDER_SITE_OTHER): Payer: Medicare HMO | Admitting: Cardiology

## 2021-03-29 ENCOUNTER — Other Ambulatory Visit: Payer: Self-pay

## 2021-03-29 VITALS — BP 164/80 | HR 62 | Ht 71.0 in | Wt 233.8 lb

## 2021-03-29 DIAGNOSIS — Z952 Presence of prosthetic heart valve: Secondary | ICD-10-CM

## 2021-03-29 DIAGNOSIS — Z951 Presence of aortocoronary bypass graft: Secondary | ICD-10-CM | POA: Diagnosis not present

## 2021-03-29 DIAGNOSIS — I25119 Atherosclerotic heart disease of native coronary artery with unspecified angina pectoris: Secondary | ICD-10-CM

## 2021-03-29 DIAGNOSIS — I10 Essential (primary) hypertension: Secondary | ICD-10-CM | POA: Diagnosis not present

## 2021-03-29 DIAGNOSIS — F17200 Nicotine dependence, unspecified, uncomplicated: Secondary | ICD-10-CM

## 2021-03-29 NOTE — Patient Instructions (Signed)

## 2021-03-29 NOTE — Progress Notes (Signed)
Cardiology Office Note:    Date:  03/29/2021   ID:  Blake Griffin, DOB June 19, 1956, MRN [id number]  PCP:  Wilmer Floor., MD  Cardiologist:  Garwin Brothers, MD   Referring MD: Wilmer Floor., MD    ASSESSMENT:    1. Coronary artery disease involving native coronary artery of native heart with angina pectoris (HCC)   2. Benign essential HTN   3. S/P AVR (aortic valve replacement)    4. S/P CABG x 1   5. Smoking    PLAN:    In order of problems listed above:  Coronary artery disease: Secondary prevention stressed with patient.  Importance of compliance with diet medication stressed any vocalized understanding.  He promises to do better with exercise.  I told him to walk at least half an hour a day 5 days a week and he promises to do so. Post aortic valve replacement: Stable at this time.  Echo report discussed below.  I discussed this with the patient. Mixed dyslipidemia: Diet was emphasized.  Lipids were reviewed.  He is taking his medications regularly. Overweight and obesity: Risks explained.  He has significant abdominal obesity and I explained the risks of this to cardiovascular health.  Diet was emphasized. Cigarette smoker: I spent 5 minutes with the patient discussing solely about smoking. Smoking cessation was counseled. I suggested to the patient also different medications and pharmacological interventions. Patient is keen to try stopping on its own at this time. He will get back to me if he needs any further assistance in this matter. Patient will be seen in follow-up appointment in 6 months or earlier if the patient has any concerns    Medication Adjustments/Labs and Tests Ordered: Current medicines are reviewed at length with the patient today.  Concerns regarding medicines are outlined above.  No orders of the defined types were placed in this encounter.  No orders of the defined types were placed in this encounter.    No chief complaint on file.     History of Present Illness:    Blake Griffin is a 65 y.o. male.  Patient has past medical history of coronary artery disease, aortic valve replacement, essential hypertension, dyslipidemia and active smoking.  Unfortunately continues to smoke.  He leads a sedentary lifestyle.  He is overweight.  He denies any chest pain orthopnea or PND.  At the time of my evaluation, the patient is alert awake oriented and in no distress.   Past Medical History:  Diagnosis Date   Abnormal nuclear cardiac imaging test    Anal fissure    Aortic stenosis    Benign essential HTN 08/28/2017   Bilateral carotid bruits    COPD (chronic obstructive pulmonary disease) (HCC)    Coronary artery disease involving native coronary artery of native heart with angina pectoris (HCC)    Dyslipidemia    ED (erectile dysfunction)    Erectile dysfunction 06/29/2018   Hemorrhoids    Hypertension    Hypogonadism in male    Left bundle branch block (LBBB) 08/06/2018   Leg cramps    Light headed 08/28/2017   Pain of left shoulder region    Petechiae    Pneumonia 2016   S/P aortic valve replacement with bioprosthetic valve 08/06/2018   25 mm Edwards Intuity Elite stented bovine pericardial bioprosthetic tissue valve  [id number] Model: 8300AB   S/P AVR (aortic valve replacement)  08/06/2018   25 mm Edwards Intuity Elite stented bovine pericardial bioprosthetic tissue valve  [id number] Model: 8300AB   S/P CABG x 1 08/06/2018   SVG to RCA, EVH via right thigh   Smoking    Vitamin D deficiency     Past Surgical History:  Procedure Laterality Date   AORTIC VALVE REPLACEMENT N/A 08/06/2018   Procedure: AORTIC VALVE REPLACEMENT (AVR);  Surgeon: Purcell Nails, MD;  Location: [facility] OR;  Service: Open Heart Surgery;  Laterality: N/A;   APPENDECTOMY     COLONOSCOPY W/ POLYPECTOMY     x 2   CORONARY ARTERY BYPASS GRAFT N/A 08/06/2018   Procedure: CORONARY ARTERY BYPASS GRAFTING (CABG) times one using right greater saphenous  vein harvested endoscopically and aortic valve replacement.;  Surgeon: Purcell Nails, MD;  Location: [facility] OR;  Service: Open Heart Surgery;  Laterality: N/A;   CORONARY/GRAFT ANGIOGRAPHY N/A 11/21/2020   Procedure: CORONARY/GRAFT ANGIOGRAPHY;  Surgeon: Lennette Bihari, MD;  Location: [facility] INVASIVE CV LAB;  Service: Cardiovascular;  Laterality: N/A;   Lung Biospy Right    lower lung   RIGHT/LEFT HEART CATH AND CORONARY ANGIOGRAPHY N/A 06/30/2018   Procedure: RIGHT/LEFT HEART CATH AND CORONARY ANGIOGRAPHY;  Surgeon: Lyn Records, MD;  Location: [facility] INVASIVE CV LAB;  Service: Cardiovascular;  Laterality: N/A;   TEE WITHOUT CARDIOVERSION N/A 08/06/2018   Procedure: TRANSESOPHAGEAL ECHOCARDIOGRAM (TEE);  Surgeon: Purcell Nails, MD;  Location: [facility] OR;  Service: Open Heart Surgery;  Laterality: N/A;   TOOTH EXTRACTION N/A 07/30/2018   Procedure: Extraction of tooth #5 w/ Alveoloplasty and Gross debridement of remaining dentition.;  Surgeon: Charlynne Pander, DDS;  Location: [facility] OR;  Service: Oral Surgery;  Laterality: N/A;    Current Medications: Current Meds  Medication Sig   albuterol (PROVENTIL HFA;VENTOLIN HFA) 108 (90 Base) MCG/ACT inhaler Inhale 2 puffs into the lungs every 6 (six) hours as needed for wheezing or shortness of breath.    aspirin EC 81 MG tablet Take 1 tablet (81 mg total) by mouth daily.   atorvastatin (LIPITOR) 40 MG tablet Take 20 mg by mouth daily.   D3-50 1.25 MG (50000 UT) capsule Take 50,000 Units by mouth once a week.   lisinopril-hydrochlorothiazide (ZESTORETIC) 20-12.5 MG tablet Take 1 tablet by mouth daily.   metoprolol tartrate (LOPRESSOR) 25 MG tablet Take 1 tablet (25 mg total) by mouth 2 (two) times daily.   naproxen sodium (ALEVE) 220 MG tablet Take 220 mg by mouth daily as needed for pain (pain).   nitroGLYCERIN (NITROSTAT) 0.4 MG SL tablet Place 0.4 mg under the tongue every 5 (five) minutes as needed for chest pain.   ranolazine (RANEXA) 1000 MG SR tablet Take  1 tablet (1,000 mg total) by mouth 2 (two) times daily.   valACYclovir (VALTREX) 500 MG tablet Take 500 mg by mouth daily as needed for other (cold sores). Cold sores     Allergies:   Patient has no known allergies.   Social History   Socioeconomic History   Marital status: Married    Spouse name: Not on file   Number of children: 5   Years of education: Not on file   Highest education level: Not on file  Occupational History   Not on file  Tobacco Use   Smoking status: Former    Packs/day: 1.00    Years: 50.00    Pack years: 50.00    Types: Cigarettes    Quit date: 08/06/2018    Years since quitting: 2.6   Smokeless tobacco: Never  Vaping Use   Vaping Use: Never  used  Substance and Sexual Activity   Alcohol use: Yes    Alcohol/week: 14.0 standard drinks    Types: 14 Shots of liquor per week    Comment: Bacardi rum daily   Drug use: Never   Sexual activity: Not on file  Other Topics Concern   Not on file  Social History Narrative   Not on file   Social Determinants of Health   Financial Resource Strain: Not on file  Food Insecurity: Not on file  Transportation Needs: Not on file  Physical Activity: Not on file  Stress: Not on file  Social Connections: Not on file     Family History: The patient's family history includes Coronary artery disease in his mother; Hypertension in his father and mother.  ROS:   Please see the history of present illness.    All other systems reviewed and are negative.  EKGs/Labs/Other Studies Reviewed:    The following studies were reviewed today: IMPRESSIONS     1. Left ventricular ejection fraction, by estimation, is 45 to 50%. The  left ventricle has mildly decreased function. The left ventricle  demonstrates global hypokinesis. There is mild concentric left ventricular  hypertrophy. Left ventricular diastolic  parameters are consistent with Grade I diastolic dysfunction (impaired  relaxation).   2. Right ventricular  systolic function is normal. The right ventricular  size is normal.   3. The mitral valve is normal in structure. No evidence of mitral valve  regurgitation. No evidence of mitral stenosis.   4. Normal AVR velocity and AT. The aortic valve is normal in structure.  Aortic valve regurgitation is not visualized. No aortic stenosis is  present. There is a 25 mm Edwards bioprosthetic valve present in the  aortic position. Procedure Date: 08/06/2018.   5. Aortic Normal arch and DTA size. There is mild dilatation of the  ascending aorta, measuring 37 mm.   6. The inferior vena cava is normal in size with greater than 50%  respiratory variability, suggesting right atrial pressure of 3 mmHg.    Recent Labs: 11/17/2020: BUN 22; Creatinine, Ser 0.83; Hemoglobin 15.4; Platelets 194; Potassium 4.7; Sodium 143  Recent Lipid Panel    Component Value Date/Time   CHOL 139 06/29/2018 0938   TRIG 173 (H) 06/29/2018 0938   HDL 38 (L) 06/29/2018 0938   CHOLHDL 3.7 06/29/2018 0938   LDLCALC 66 06/29/2018 0938    Physical Exam:    VS:  BP (!) 164/80   Pulse 62   Ht 5\' 11"  (1.803 m)   Wt 233 lb 12.8 oz (106.1 kg)   SpO2 91%   BMI 32.61 kg/m     Wt Readings from Last 3 Encounters:  03/29/21 233 lb 12.8 oz (106.1 kg)  12/21/20 236 lb 6.4 oz (107.2 kg)  11/21/20 232 lb (105.2 kg)     GEN: Patient is in no acute distress HEENT: Normal NECK: No JVD; No carotid bruits LYMPHATICS: No lymphadenopathy CARDIAC: Hear sounds regular, 2/6 systolic murmur at the apex. RESPIRATORY:  Clear to auscultation without rales, wheezing or rhonchi  ABDOMEN: Soft, non-tender, non-distended MUSCULOSKELETAL:  No edema; No deformity  SKIN: Warm and dry NEUROLOGIC:  Alert and oriented x 3 PSYCHIATRIC:  Normal affect   Signed, 01/19/21, MD  03/29/2021 9:18 AM    [facility]

## 2021-05-02 DIAGNOSIS — E669 Obesity, unspecified: Secondary | ICD-10-CM | POA: Diagnosis not present

## 2021-05-02 DIAGNOSIS — Z0000 Encounter for general adult medical examination without abnormal findings: Secondary | ICD-10-CM | POA: Diagnosis not present

## 2021-05-02 DIAGNOSIS — E785 Hyperlipidemia, unspecified: Secondary | ICD-10-CM | POA: Diagnosis not present

## 2021-05-02 DIAGNOSIS — Z9181 History of falling: Secondary | ICD-10-CM | POA: Diagnosis not present

## 2021-05-02 DIAGNOSIS — Z1331 Encounter for screening for depression: Secondary | ICD-10-CM | POA: Diagnosis not present

## 2021-06-06 DIAGNOSIS — Z6841 Body Mass Index (BMI) 40.0 and over, adult: Secondary | ICD-10-CM | POA: Diagnosis not present

## 2021-06-06 DIAGNOSIS — E785 Hyperlipidemia, unspecified: Secondary | ICD-10-CM | POA: Diagnosis not present

## 2021-06-06 DIAGNOSIS — E559 Vitamin D deficiency, unspecified: Secondary | ICD-10-CM | POA: Diagnosis not present

## 2021-06-06 DIAGNOSIS — M7551 Bursitis of right shoulder: Secondary | ICD-10-CM | POA: Diagnosis not present

## 2021-06-06 DIAGNOSIS — Z79899 Other long term (current) drug therapy: Secondary | ICD-10-CM | POA: Diagnosis not present

## 2021-06-06 DIAGNOSIS — J449 Chronic obstructive pulmonary disease, unspecified: Secondary | ICD-10-CM | POA: Diagnosis not present

## 2021-06-06 DIAGNOSIS — I350 Nonrheumatic aortic (valve) stenosis: Secondary | ICD-10-CM | POA: Diagnosis not present

## 2021-06-06 DIAGNOSIS — I10 Essential (primary) hypertension: Secondary | ICD-10-CM | POA: Diagnosis not present

## 2021-06-06 DIAGNOSIS — K429 Umbilical hernia without obstruction or gangrene: Secondary | ICD-10-CM | POA: Diagnosis not present

## 2021-07-19 ENCOUNTER — Telehealth: Payer: Self-pay | Admitting: Cardiology

## 2021-07-19 DIAGNOSIS — Z6841 Body Mass Index (BMI) 40.0 and over, adult: Secondary | ICD-10-CM | POA: Diagnosis not present

## 2021-07-19 DIAGNOSIS — R55 Syncope and collapse: Secondary | ICD-10-CM | POA: Diagnosis not present

## 2021-07-19 DIAGNOSIS — Z79899 Other long term (current) drug therapy: Secondary | ICD-10-CM | POA: Diagnosis not present

## 2021-07-19 NOTE — Telephone Encounter (Signed)
Returned call to pt he states that he was out of work on Monday, Tuesday. Wednesday and today, he states that he tried to go to work on Monday and was "driving down the road and got a little ways and got scared" so he called his dispatch and went back home, he did not take his blood pressure. He verifies that he does take his medication as ordered daily. He states that his BP on Tuesday was 180/70. He states that his BP "has went down since then, 170's, 160's 150's. He cannot give me any other BP's he does not remember.  He states that he will look at his BP machine and email his BP/HR logged there. Pt states that he needs a note to return to work since he has been not "a few days". Please advise and provide note to return to work.

## 2021-07-19 NOTE — Telephone Encounter (Signed)
Pt c/o BP issue: STAT if pt c/o blurred vision, one-sided weakness or slurred speech  1. What are your last 5 BP readings? 140/60 180/70-MONDAY  2. Are you having any other symptoms (ex. Dizziness, headache, blurred vision, passed out)? LIGHTHEADEDNESS DIZZINESS  3. What is your BP issue? PT BP IS ELEVATED   Pt c/o Syncope: STAT if syncope occurred within 30 minutes and pt complains of lightheadedness High Priority if episode of passing out, completely, today or in last 24 hours   Did you pass out today? NO  When is the last time you passed out? PT ALMOST PASSED OUT MONDAY  Has this occurred multiple times?  ALLDAY  Did you have any symptoms prior to passing out? DIZZINESS PT HAS AN APPT IN December, TAMMY FROM DR CAMPBELL'S OFFICE WANTS TO KNOW IF PT CAN BE SEEN SOONER

## 2021-07-20 ENCOUNTER — Ambulatory Visit (INDEPENDENT_AMBULATORY_CARE_PROVIDER_SITE_OTHER): Payer: Medicare HMO | Admitting: Cardiology

## 2021-07-20 ENCOUNTER — Other Ambulatory Visit: Payer: Self-pay

## 2021-07-20 ENCOUNTER — Encounter: Payer: Self-pay | Admitting: Cardiology

## 2021-07-20 VITALS — BP 138/72 | HR 62 | Ht 71.0 in | Wt 228.8 lb

## 2021-07-20 DIAGNOSIS — I10 Essential (primary) hypertension: Secondary | ICD-10-CM | POA: Diagnosis not present

## 2021-07-20 DIAGNOSIS — E785 Hyperlipidemia, unspecified: Secondary | ICD-10-CM | POA: Diagnosis not present

## 2021-07-20 DIAGNOSIS — I25119 Atherosclerotic heart disease of native coronary artery with unspecified angina pectoris: Secondary | ICD-10-CM | POA: Diagnosis not present

## 2021-07-20 DIAGNOSIS — Z953 Presence of xenogenic heart valve: Secondary | ICD-10-CM | POA: Diagnosis not present

## 2021-07-20 NOTE — Progress Notes (Signed)
Cardiology Office Note:    Date:  07/20/2021   ID:  Blake Griffin, DOB 09/07/1956, MRN [id number]  PCP:  Wilmer Floor., MD  Cardiologist:  Garwin Brothers, MD   Referring MD: Wilmer Floor., MD    ASSESSMENT:    1. Coronary artery disease involving native coronary artery of native heart with angina pectoris (HCC)   2. Primary hypertension   3. Dyslipidemia   4. S/P aortic valve replacement with bioprosthetic valve    PLAN:    In order of problems listed above:  Coronary artery disease: Secondary prevention stressed with the patient.  Importance of compliance with diet medication stressed any vocalized understanding. Dizziness: This is completely resolved.  The patient admits that he had excessive alcohol previous night when this happened.  Subsequently since he has been cleared of this he has had no issues.  He has been walking significantly without any symptoms and no dizziness or any such symptoms have recurred.  His blood pressure also has been stable.  In view of this he requests a note to get back to work and I think he should be fine with this in my clinical judgment.  If he has any recurrence he will get back to [place] immediately.  I reviewed records including coronary angiography echocardiogram reports from the past. Post aortic valve replacement: Stable at this time.  Echocardiogram report reviewed. Essential hypertension, mixed dyslipidemia, diabetes mellitus and obesity: Lifestyle modification urged to the patient he promises to do better.  He was advised to walk at least half an hour a day 5 days a week and he promises to do so. Patient will be seen in follow-up appointment in 6 months or earlier if the patient has any concerns    Medication Adjustments/Labs and Tests Ordered: Current medicines are reviewed at length with the patient today.  Concerns regarding medicines are outlined above.  No orders of the defined types were placed in this encounter.  No  orders of the defined types were placed in this encounter.    No chief complaint on file.    History of Present Illness:    Blake Griffin is a 65 y.o. male.  Patient has past medical history of coronary artery disease, aortic valve replacement, essential hypertension and obesity.  He called our office mentioning that he had a spell of dizziness the other day.  No chest pain orthopnea PND or syncope.  Therefore we asked him to come in today to be evaluated.  Patient denies any chest pain orthopnea or PND.  Patient mentions to me that the time he felt dizzy the previous day.  Much more alcohol than he usually consumes.  Subsequently once this possible hangover effect resolved he has had no issues.  In the past day or 2 he has been ambulating well without any chest pain or any dizziness or any such symptoms.  He confirmed to me that he has never had a syncopal episode.  At the time of my evaluation, the patient is alert awake oriented and in no distress.  Past Medical History:  Diagnosis Date   Abnormal nuclear cardiac imaging test    Anal fissure    Aortic stenosis    Benign essential HTN 08/28/2017   Bilateral carotid bruits    COPD (chronic obstructive pulmonary disease) (HCC)    Coronary artery disease involving native coronary artery of native heart with angina pectoris (HCC)    Dyslipidemia    ED (erectile dysfunction)  Erectile dysfunction 06/29/2018   Hemorrhoids    Hypertension    Hypogonadism in male    Left bundle branch block (LBBB) 08/06/2018   Leg cramps    Light headed 08/28/2017   Pain of left shoulder region    Petechiae    Pneumonia 2016   S/P aortic valve replacement with bioprosthetic valve 08/06/2018   25 mm Edwards Intuity Elite stented bovine pericardial bioprosthetic tissue valve  [id number] Model: 8300AB   S/P AVR (aortic valve replacement)  08/06/2018   25 mm Edwards Intuity Elite stented bovine pericardial bioprosthetic tissue valve  [id number] Model:  8300AB   S/P CABG x 1 08/06/2018   SVG to RCA, EVH via right thigh   Smoking    Vitamin D deficiency     Past Surgical History:  Procedure Laterality Date   AORTIC VALVE REPLACEMENT N/A 08/06/2018   Procedure: AORTIC VALVE REPLACEMENT (AVR);  Surgeon: Purcell Nails, MD;  Location: [facility] OR;  Service: Open Heart Surgery;  Laterality: N/A;   APPENDECTOMY     COLONOSCOPY W/ POLYPECTOMY     x 2   CORONARY ARTERY BYPASS GRAFT N/A 08/06/2018   Procedure: CORONARY ARTERY BYPASS GRAFTING (CABG) times one using right greater saphenous vein harvested endoscopically and aortic valve replacement.;  Surgeon: Purcell Nails, MD;  Location: [facility] OR;  Service: Open Heart Surgery;  Laterality: N/A;   CORONARY/GRAFT ANGIOGRAPHY N/A 11/21/2020   Procedure: CORONARY/GRAFT ANGIOGRAPHY;  Surgeon: Lennette Bihari, MD;  Location: [facility] INVASIVE CV LAB;  Service: Cardiovascular;  Laterality: N/A;   Lung Biospy Right    lower lung   RIGHT/LEFT HEART CATH AND CORONARY ANGIOGRAPHY N/A 06/30/2018   Procedure: RIGHT/LEFT HEART CATH AND CORONARY ANGIOGRAPHY;  Surgeon: Lyn Records, MD;  Location: [facility] INVASIVE CV LAB;  Service: Cardiovascular;  Laterality: N/A;   TEE WITHOUT CARDIOVERSION N/A 08/06/2018   Procedure: TRANSESOPHAGEAL ECHOCARDIOGRAM (TEE);  Surgeon: Purcell Nails, MD;  Location: [facility] OR;  Service: Open Heart Surgery;  Laterality: N/A;   TOOTH EXTRACTION N/A 07/30/2018   Procedure: Extraction of tooth #5 w/ Alveoloplasty and Gross debridement of remaining dentition.;  Surgeon: Charlynne Pander, DDS;  Location: [facility] OR;  Service: Oral Surgery;  Laterality: N/A;    Current Medications: Current Meds  Medication Sig   albuterol (PROVENTIL HFA;VENTOLIN HFA) 108 (90 Base) MCG/ACT inhaler Inhale 2 puffs into the lungs every 6 (six) hours as needed for wheezing or shortness of breath.    aspirin EC 81 MG tablet Take 1 tablet (81 mg total) by mouth daily.   atorvastatin (LIPITOR) 40 MG tablet Take 20 mg by mouth  daily.   D3-50 1.25 MG (50000 UT) capsule Take 50,000 Units by mouth once a week.   lisinopril-hydrochlorothiazide (ZESTORETIC) 20-12.5 MG tablet Take 1 tablet by mouth daily.   metoprolol tartrate (LOPRESSOR) 25 MG tablet Take 1 tablet (25 mg total) by mouth 2 (two) times daily.   naproxen sodium (ALEVE) 220 MG tablet Take 220 mg by mouth daily as needed for pain (pain).   nitroGLYCERIN (NITROSTAT) 0.4 MG SL tablet Place 0.4 mg under the tongue every 5 (five) minutes as needed for chest pain.   ranolazine (RANEXA) 1000 MG SR tablet Take 1 tablet (1,000 mg total) by mouth 2 (two) times daily.   valACYclovir (VALTREX) 500 MG tablet Take 500 mg by mouth daily as needed for other (cold sores). Cold sores     Allergies:   Patient has no known allergies.   Social History  Socioeconomic History   Marital status: Married    Spouse name: Not on file   Number of children: 5   Years of education: Not on file   Highest education level: Not on file  Occupational History   Not on file  Tobacco Use   Smoking status: Former    Packs/day: 1.00    Years: 50.00    Pack years: 50.00    Types: Cigarettes    Quit date: 08/06/2018    Years since quitting: 2.9   Smokeless tobacco: Never  Vaping Use   Vaping Use: Never used  Substance and Sexual Activity   Alcohol use: Yes    Alcohol/week: 14.0 standard drinks    Types: 14 Shots of liquor per week    Comment: Bacardi rum daily   Drug use: Never   Sexual activity: Not on file  Other Topics Concern   Not on file  Social History Narrative   Not on file   Social Determinants of Health   Financial Resource Strain: Not on file  Food Insecurity: Not on file  Transportation Needs: Not on file  Physical Activity: Not on file  Stress: Not on file  Social Connections: Not on file     Family History: The patient's family history includes Coronary artery disease in his mother; Hypertension in his father and mother.  ROS:   Please see the  history of present illness.    All other systems reviewed and are negative.  EKGs/Labs/Other Studies Reviewed:    The following studies were reviewed today: I discussed my findings with the patient at length.   Recent Labs: 11/17/2020: BUN 22; Creatinine, Ser 0.83; Hemoglobin 15.4; Platelets 194; Potassium 4.7; Sodium 143  Recent Lipid Panel    Component Value Date/Time   CHOL 139 06/29/2018 0938   TRIG 173 (H) 06/29/2018 0938   HDL 38 (L) 06/29/2018 0938   CHOLHDL 3.7 06/29/2018 0938   LDLCALC 66 06/29/2018 0938    Physical Exam:    VS:  BP 138/72   Pulse 62   Ht 5\' 11"  (1.803 m)   Wt 228 lb 12.8 oz (103.8 kg)   SpO2 92%   BMI 31.91 kg/m     Wt Readings from Last 3 Encounters:  07/20/21 228 lb 12.8 oz (103.8 kg)  03/29/21 233 lb 12.8 oz (106.1 kg)  12/21/20 236 lb 6.4 oz (107.2 kg)     GEN: Patient is in no acute distress HEENT: Normal NECK: No JVD; No carotid bruits LYMPHATICS: No lymphadenopathy CARDIAC: Hear sounds regular, 2/6 systolic murmur at the apex. RESPIRATORY:  Clear to auscultation without rales, wheezing or rhonchi  ABDOMEN: Soft, non-tender, non-distended MUSCULOSKELETAL:  No edema; No deformity  SKIN: Warm and dry NEUROLOGIC:  Alert and oriented x 3 PSYCHIATRIC:  Normal affect   Signed, 02/20/21, MD  07/20/2021 10:09 AM    [facility]

## 2021-07-20 NOTE — Patient Instructions (Signed)
Medication Instructions:  No medication changes. *If you need a refill on your cardiac medications before your next appointment, please call your pharmacy*   Lab Work: None ordered If you have labs (blood work) drawn today and your tests are completely normal, you will receive your results only by: MyChart Message (if you have MyChart) OR A paper copy in the mail If you have any lab test that is abnormal or we need to change your treatment, we will call you to review the results.   Testing/Procedures: None ordered   Follow-Up: At [facility], you and your health needs are our priority.  As part of our continuing mission to provide you with exceptional heart care, we have created designated Provider Care Teams.  These Care Teams include your primary Cardiologist (physician) and Advanced Practice Providers (APPs -  Physician Assistants and Nurse Practitioners) who all work together to provide you with the care you need, when you need it.  We recommend signing up for the patient portal called "MyChart".  Sign up information is provided on this After Visit Summary.  MyChart is used to connect with patients for Virtual Visits (Telemedicine).  Patients are able to view lab/test results, encounter notes, upcoming appointments, etc.  Non-urgent messages can be sent to your provider as well.   To learn more about what you can do with MyChart, go to [email or web address].    Your next appointment:   You have scheduled  The format for your next appointment:   In Person  Provider:   Belva Crome, MD   Other Instructions NA

## 2021-09-13 DIAGNOSIS — Z6841 Body Mass Index (BMI) 40.0 and over, adult: Secondary | ICD-10-CM | POA: Diagnosis not present

## 2021-09-13 DIAGNOSIS — Z79899 Other long term (current) drug therapy: Secondary | ICD-10-CM | POA: Diagnosis not present

## 2021-09-13 DIAGNOSIS — M5412 Radiculopathy, cervical region: Secondary | ICD-10-CM | POA: Diagnosis not present

## 2021-10-09 ENCOUNTER — Other Ambulatory Visit: Payer: Self-pay

## 2021-10-09 ENCOUNTER — Ambulatory Visit (INDEPENDENT_AMBULATORY_CARE_PROVIDER_SITE_OTHER): Payer: Medicare HMO | Admitting: Cardiology

## 2021-10-09 ENCOUNTER — Encounter: Payer: Self-pay | Admitting: Cardiology

## 2021-10-09 VITALS — BP 148/80 | HR 60 | Ht 71.0 in | Wt 226.8 lb

## 2021-10-09 DIAGNOSIS — I25119 Atherosclerotic heart disease of native coronary artery with unspecified angina pectoris: Secondary | ICD-10-CM | POA: Diagnosis not present

## 2021-10-09 DIAGNOSIS — I10 Essential (primary) hypertension: Secondary | ICD-10-CM | POA: Diagnosis not present

## 2021-10-09 DIAGNOSIS — Z951 Presence of aortocoronary bypass graft: Secondary | ICD-10-CM | POA: Diagnosis not present

## 2021-10-09 DIAGNOSIS — E785 Hyperlipidemia, unspecified: Secondary | ICD-10-CM | POA: Diagnosis not present

## 2021-10-09 DIAGNOSIS — Z952 Presence of prosthetic heart valve: Secondary | ICD-10-CM

## 2021-10-09 DIAGNOSIS — F17200 Nicotine dependence, unspecified, uncomplicated: Secondary | ICD-10-CM

## 2021-10-09 NOTE — Patient Instructions (Signed)
Medication Instructions:  Your physician recommends that you continue on your current medications as directed. Please refer to the Current Medication list given to you today.  *If you need a refill on your cardiac medications before your next appointment, please call your pharmacy*   Lab Work: Your physician recommends that you return for lab work in: at the time of your stress test. You need to have labs done when you are fasting.  You can come Monday through Friday 8:30 am to 12:00 pm and 1:15 to 4:30. You do not need to make an appointment as the order has already been placed. The labs you are going to have done are BMET, CBC, TSH, LFT and Lipids.  If you have labs (blood work) drawn today and your tests are completely normal, you will receive your results only by: MyChart Message (if you have MyChart) OR A paper copy in the mail If you have any lab test that is abnormal or we need to change your treatment, we will call you to review the results.   Testing/Procedures: Your physician has requested that you have a lexiscan myoview. For further information please visit [email or web address] Please follow instruction sheet, as given.  The test will take approximately 3 to 4 hours to complete; you may bring reading material.  If someone comes with you to your appointment, they will need to remain in the main lobby due to limited space in the testing area. **If you are pregnant or breastfeeding, please notify the nuclear lab prior to your appointment**  How to prepare for your Myocardial Perfusion Test: Do not eat or drink 3 hours prior to your test, except you may have water. Do not consume products containing caffeine (regular or decaffeinated) 12 hours prior to your test. (ex: coffee, chocolate, sodas, tea). Do bring a list of your current medications with you.  If not listed below, you may take your medications as normal. Do not take metoprolol (Lopressor, Toprol) for 24 hours prior to the  test.  Bring the medication to your appointment as you may be required to take it once the test is complete. Do wear comfortable clothes (no dresses or overalls) and walking shoes, tennis shoes preferred (No heels or open toe shoes are allowed). Do NOT wear cologne, perfume, aftershave, or lotions (deodorant is allowed). If these instructions are not followed, your test will have to be rescheduled.    Follow-Up: At [facility], you and your health needs are our priority.  As part of our continuing mission to provide you with exceptional heart care, we have created designated Provider Care Teams.  These Care Teams include your primary Cardiologist (physician) and Advanced Practice Providers (APPs -  Physician Assistants and Nurse Practitioners) who all work together to provide you with the care you need, when you need it.  We recommend signing up for the patient portal called "MyChart".  Sign up information is provided on this After Visit Summary.  MyChart is used to connect with patients for Virtual Visits (Telemedicine).  Patients are able to view lab/test results, encounter notes, upcoming appointments, etc.  Non-urgent messages can be sent to your provider as well.   To learn more about what you can do with MyChart, go to [email or web address].    Your next appointment:   6 month(s)  The format for your next appointment:   In Person  Provider:   Belva Crome, MD   Other Instructions Cardiac Nuclear Scan A cardiac nuclear scan is a test that is  done to check the flow of blood to your heart. It is done when you are resting and when you are exercising. The test looks for problems such as: Not enough blood reaching a portion of the heart. The heart muscle not working as it should. You may need this test if: You have heart disease. You have had lab results that are not normal. You have had heart surgery or a balloon procedure to open up blocked arteries (angioplasty). You have  chest pain. You have shortness of breath. In this test, a special dye (tracer) is put into your bloodstream. The tracer will travel to your heart. A camera will then take pictures of your heart to see how the tracer moves through your heart. This test is usually done at a hospital and takes 2-4 hours. Tell a doctor about: Any allergies you have. All medicines you are taking, including vitamins, herbs, eye drops, creams, and over-the-counter medicines. Any problems you or family members have had with anesthetic medicines. Any blood disorders you have. Any surgeries you have had. Any medical conditions you have. Whether you are pregnant or may be pregnant. What are the risks? Generally, this is a safe test. However, problems may occur, such as: Serious chest pain and heart attack. This is only a risk if the stress portion of the test is done. Rapid heartbeat. A feeling of warmth in your chest. This feeling usually does not last long. Allergic reaction to the tracer. What happens before the test? Ask your doctor about changing or stopping your normal medicines. This is important. Follow instructions from your doctor about what you cannot eat or drink. Remove your jewelry on the day of the test. What happens during the test? An IV tube will be inserted into one of your veins. Your doctor will give you a small amount of tracer through the IV tube. You will wait for 20-40 minutes while the tracer moves through your bloodstream. Your heart will be monitored with an electrocardiogram (ECG). You will lie down on an exam table. Pictures of your heart will be taken for about 15-20 minutes. You may also have a stress test. For this test, one of these things may be done: You will be asked to exercise on a treadmill or a stationary bike. You will be given medicines that will make your heart work harder. This is done if you are unable to exercise. When blood flow to your heart has peaked, a tracer  will again be given through the IV tube. After 20-40 minutes, you will get back on the exam table. More pictures will be taken of your heart. Depending on the tracer that is used, more pictures may need to be taken 3-4 hours later. Your IV tube will be removed when the test is over. The test may vary among doctors and hospitals. What happens after the test? Ask your doctor: Whether you can return to your normal schedule, including diet, activities, and medicines. Whether you should drink more fluids. This will help to remove the tracer from your body. Drink enough fluid to keep your pee (urine) pale yellow. Ask your doctor, or the department that is doing the test: When will my results be ready? How will I get my results? Summary A cardiac nuclear scan is a test that is done to check the flow of blood to your heart. Tell your doctor whether you are pregnant or may be pregnant. Before the test, ask your doctor about changing or stopping your normal  medicines. This is important. Ask your doctor whether you can return to your normal activities. You may be asked to drink more fluids. This information is not intended to replace advice given to you by your health care provider. Make sure you discuss any questions you have with your health care provider. Document Revised: 01/20/2019 Document Reviewed: 03/16/2018 Elsevier Patient Education  2021 [employer].

## 2021-10-09 NOTE — Progress Notes (Signed)
Cardiology Office Note:    Date:  10/09/2021   ID:  Blake Griffin, DOB 21-Apr-1956, MRN [id number]  PCP:  Wilmer Floor., MD  Cardiologist:  Garwin Brothers, MD   Referring MD: Wilmer Floor., MD    ASSESSMENT:    1. Coronary artery disease involving native coronary artery of native heart with angina pectoris (HCC)   2. S/P CABG x 1   3. Benign essential HTN   4. S/P AVR (aortic valve replacement)    5. Smoking   6. Dyslipidemia    PLAN:    In order of problems listed above:  Coronary artery disease: Secondary prevention stressed with the patient.  Importance of compliance with diet medication stressed and vocalized understanding.  He was advised to walk at least half an hour a day 5 days a week and he promises to do so. Post aortic valve replacement: Stable at this time. DOT physical: Patient requests a stress test that he needs annually.  He has known coronary artery disease and we will do an exercise stress Cardiolite.  If he cannot walk this may be changed to Kathryn. Essential hypertension: Blood pressure stable and diet was emphasized.  Lifestyle modification urged. Mixed dyslipidemia and obesity: Lipids were reviewed.  We will recheck them when he comes for a stress test.  Diet emphasized. Cigarette smoker: Cigarette smoking counseled at length and risks explained and he promises to do better. Patient will be seen in follow-up appointment in 6 months or earlier if the patient has any concerns    Medication Adjustments/Labs and Tests Ordered: Current medicines are reviewed at length with the patient today.  Concerns regarding medicines are outlined above.  Orders Placed This Encounter  Procedures   Basic metabolic panel   CBC with Differential/Platelet   Hepatic function panel   Lipid panel   TSH   MYOCARDIAL PERFUSION IMAGING   No orders of the defined types were placed in this encounter.    No chief complaint on file.    History of Present  Illness:    Blake Griffin is a 65 y.o. male.  Patient has past medical history of coronary artery disease post aortic valve replacement, essential hypertension, dyslipidemia, obesity and cigarette smoking.  Overall he leads a sedentary lifestyle.  He is a [employer].  No chest pain orthopnea or PND.  At the time of my evaluation, the patient is alert awake oriented and in no distress.  Past Medical History:  Diagnosis Date   Abnormal nuclear cardiac imaging test    Anal fissure    Aortic stenosis    Benign essential HTN 08/28/2017   Bilateral carotid bruits    COPD (chronic obstructive pulmonary disease) (HCC)    Coronary artery disease involving native coronary artery of native heart with angina pectoris (HCC)    Dyslipidemia    ED (erectile dysfunction)    Erectile dysfunction 06/29/2018   Hemorrhoids    Hypertension    Hypogonadism in male    Left bundle branch block (LBBB) 08/06/2018   Leg cramps    Light headed 08/28/2017   Pain of left shoulder region    Petechiae    Pneumonia 2016   S/P aortic valve replacement with bioprosthetic valve 08/06/2018   25 mm Edwards Intuity Elite stented bovine pericardial bioprosthetic tissue valve  [id number] Model: 8300AB   S/P AVR (aortic valve replacement)  08/06/2018   25 mm Edwards Intuity Elite stented bovine pericardial bioprosthetic tissue valve  [id number]  Model: 8300AB   S/P CABG x 1 08/06/2018   SVG to RCA, EVH via right thigh   Smoking    Vitamin D deficiency     Past Surgical History:  Procedure Laterality Date   AORTIC VALVE REPLACEMENT N/A 08/06/2018   Procedure: AORTIC VALVE REPLACEMENT (AVR);  Surgeon: Purcell Nails, MD;  Location: [facility] OR;  Service: Open Heart Surgery;  Laterality: N/A;   APPENDECTOMY     COLONOSCOPY W/ POLYPECTOMY     x 2   CORONARY ARTERY BYPASS GRAFT N/A 08/06/2018   Procedure: CORONARY ARTERY BYPASS GRAFTING (CABG) times one using right greater saphenous vein harvested endoscopically and aortic  valve replacement.;  Surgeon: Purcell Nails, MD;  Location: [facility] OR;  Service: Open Heart Surgery;  Laterality: N/A;   CORONARY/GRAFT ANGIOGRAPHY N/A 11/21/2020   Procedure: CORONARY/GRAFT ANGIOGRAPHY;  Surgeon: Lennette Bihari, MD;  Location: [facility] INVASIVE CV LAB;  Service: Cardiovascular;  Laterality: N/A;   Lung Biospy Right    lower lung   RIGHT/LEFT HEART CATH AND CORONARY ANGIOGRAPHY N/A 06/30/2018   Procedure: RIGHT/LEFT HEART CATH AND CORONARY ANGIOGRAPHY;  Surgeon: Lyn Records, MD;  Location: [facility] INVASIVE CV LAB;  Service: Cardiovascular;  Laterality: N/A;   TEE WITHOUT CARDIOVERSION N/A 08/06/2018   Procedure: TRANSESOPHAGEAL ECHOCARDIOGRAM (TEE);  Surgeon: Purcell Nails, MD;  Location: [facility] OR;  Service: Open Heart Surgery;  Laterality: N/A;   TOOTH EXTRACTION N/A 07/30/2018   Procedure: Extraction of tooth #5 w/ Alveoloplasty and Gross debridement of remaining dentition.;  Surgeon: Charlynne Pander, DDS;  Location: [facility] OR;  Service: Oral Surgery;  Laterality: N/A;    Current Medications: Current Meds  Medication Sig   albuterol (PROVENTIL HFA;VENTOLIN HFA) 108 (90 Base) MCG/ACT inhaler Inhale 2 puffs into the lungs every 6 (six) hours as needed for wheezing or shortness of breath.    aspirin EC 81 MG tablet Take 1 tablet (81 mg total) by mouth daily.   atorvastatin (LIPITOR) 40 MG tablet Take 20 mg by mouth daily.   D3-50 1.25 MG (50000 UT) capsule Take 50,000 Units by mouth once a week.   lisinopril-hydrochlorothiazide (ZESTORETIC) 20-12.5 MG tablet Take 1 tablet by mouth daily.   metoprolol tartrate (LOPRESSOR) 25 MG tablet Take 1 tablet (25 mg total) by mouth 2 (two) times daily.   naproxen sodium (ALEVE) 220 MG tablet Take 440 mg by mouth daily.   nitroGLYCERIN (NITROSTAT) 0.4 MG SL tablet Place 0.4 mg under the tongue every 5 (five) minutes as needed for chest pain.   ranolazine (RANEXA) 1000 MG SR tablet Take 1 tablet (1,000 mg total) by mouth 2 (two) times daily.    valACYclovir (VALTREX) 500 MG tablet Take 500 mg by mouth daily as needed for other (cold sores). Cold sores     Allergies:   Patient has no known allergies.   Social History   Socioeconomic History   Marital status: Married    Spouse name: Not on file   Number of children: 5   Years of education: Not on file   Highest education level: Not on file  Occupational History   Not on file  Tobacco Use   Smoking status: Former    Packs/day: 1.00    Years: 50.00    Pack years: 50.00    Types: Cigarettes    Quit date: 08/06/2018    Years since quitting: 3.1   Smokeless tobacco: Never  Vaping Use   Vaping Use: Never used  Substance and Sexual Activity  Alcohol use: Yes    Alcohol/week: 14.0 standard drinks    Types: 14 Shots of liquor per week    Comment: Bacardi rum daily   Drug use: Never   Sexual activity: Not on file  Other Topics Concern   Not on file  Social History Narrative   Not on file   Social Determinants of Health   Financial Resource Strain: Not on file  Food Insecurity: Not on file  Transportation Needs: Not on file  Physical Activity: Not on file  Stress: Not on file  Social Connections: Not on file     Family History: The patient's family history includes Coronary artery disease in his mother; Hypertension in his father and mother.  ROS:   Please see the history of present illness.    All other systems reviewed and are negative.  EKGs/Labs/Other Studies Reviewed:    The following studies were reviewed today: I discussed my findings with the patient at length.   Recent Labs: 11/17/2020: BUN 22; Creatinine, Ser 0.83; Hemoglobin 15.4; Platelets 194; Potassium 4.7; Sodium 143  Recent Lipid Panel    Component Value Date/Time   CHOL 139 06/29/2018 0938   TRIG 173 (H) 06/29/2018 0938   HDL 38 (L) 06/29/2018 0938   CHOLHDL 3.7 06/29/2018 0938   LDLCALC 66 06/29/2018 0938    Physical Exam:    VS:  BP (!) 148/80    Pulse 60    Ht 5\' 11"  (1.803 m)     Wt 226 lb 12.8 oz (102.9 kg)    SpO2 95%    BMI 31.63 kg/m     Wt Readings from Last 3 Encounters:  10/09/21 226 lb 12.8 oz (102.9 kg)  07/20/21 228 lb 12.8 oz (103.8 kg)  03/29/21 233 lb 12.8 oz (106.1 kg)     GEN: Patient is in no acute distress HEENT: Normal NECK: No JVD; No carotid bruits LYMPHATICS: No lymphadenopathy CARDIAC: Hear sounds regular, 2/6 systolic murmur at the apex. RESPIRATORY:  Clear to auscultation without rales, wheezing or rhonchi  ABDOMEN: Soft, non-tender, non-distended MUSCULOSKELETAL:  No edema; No deformity  SKIN: Warm and dry NEUROLOGIC:  Alert and oriented x 3 PSYCHIATRIC:  Normal affect   Signed, 03/31/21, MD  10/09/2021 8:30 AM    [facility]

## 2021-10-11 ENCOUNTER — Telehealth (HOSPITAL_COMMUNITY): Payer: Self-pay | Admitting: *Deleted

## 2021-10-11 NOTE — Telephone Encounter (Signed)
Left message on voicemail per DPR in reference to upcoming appointment scheduled on 10/17/21 at 7:45 with detailed instructions given per Myocardial Perfusion Study Information Sheet for the test. LM to arrive 15 minutes early, and that it is imperative to arrive on time for appointment to keep from having the test rescheduled. If you need to cancel or reschedule your appointment, please call the office within 24 hours of your appointment. Failure to do so may result in a cancellation of your appointment, and a $50 no show fee. Phone number given for call back for any questions.

## 2021-10-16 DIAGNOSIS — M791 Myalgia, unspecified site: Secondary | ICD-10-CM | POA: Diagnosis not present

## 2021-10-16 DIAGNOSIS — R051 Acute cough: Secondary | ICD-10-CM | POA: Diagnosis not present

## 2021-10-16 DIAGNOSIS — Z20828 Contact with and (suspected) exposure to other viral communicable diseases: Secondary | ICD-10-CM | POA: Diagnosis not present

## 2022-01-26 ENCOUNTER — Emergency Department (HOSPITAL_BASED_OUTPATIENT_CLINIC_OR_DEPARTMENT_OTHER)
Admit: 2022-01-26 | Discharge: 2022-01-26 | Disposition: A | Discharge status: Home / Self Care | Payer: Medicare HMO | Attending: Emergency Medicine | Admitting: Emergency Medicine

## 2022-01-26 ENCOUNTER — Inpatient Hospital Stay (HOSPITAL_COMMUNITY)
Admission: EM | Admit: 2022-01-26 | Discharge: 2022-01-29 | DRG: 291 | Disposition: A | Discharge status: Home / Self Care | Payer: Medicare HMO | Attending: Internal Medicine | Admitting: Internal Medicine

## 2022-01-26 ENCOUNTER — Emergency Department (HOSPITAL_COMMUNITY): Payer: Medicare HMO

## 2022-01-26 ENCOUNTER — Encounter (HOSPITAL_COMMUNITY): Payer: Self-pay | Admitting: Emergency Medicine

## 2022-01-26 ENCOUNTER — Other Ambulatory Visit: Payer: Self-pay

## 2022-01-26 DIAGNOSIS — I509 Heart failure, unspecified: Secondary | ICD-10-CM

## 2022-01-26 DIAGNOSIS — R42 Dizziness and giddiness: Principal | ICD-10-CM | POA: Diagnosis present

## 2022-01-26 DIAGNOSIS — Z8249 Family history of ischemic heart disease and other diseases of the circulatory system: Secondary | ICD-10-CM

## 2022-01-26 DIAGNOSIS — Z91119 Patient's noncompliance with dietary regimen due to unspecified reason: Secondary | ICD-10-CM | POA: Diagnosis not present

## 2022-01-26 DIAGNOSIS — Z9049 Acquired absence of other specified parts of digestive tract: Secondary | ICD-10-CM | POA: Diagnosis not present

## 2022-01-26 DIAGNOSIS — Z720 Tobacco use: Secondary | ICD-10-CM | POA: Diagnosis present

## 2022-01-26 DIAGNOSIS — I447 Left bundle-branch block, unspecified: Secondary | ICD-10-CM | POA: Diagnosis present

## 2022-01-26 DIAGNOSIS — R55 Syncope and collapse: Secondary | ICD-10-CM | POA: Diagnosis not present

## 2022-01-26 DIAGNOSIS — I471 Supraventricular tachycardia: Secondary | ICD-10-CM | POA: Diagnosis not present

## 2022-01-26 DIAGNOSIS — I25119 Atherosclerotic heart disease of native coronary artery with unspecified angina pectoris: Secondary | ICD-10-CM | POA: Diagnosis present

## 2022-01-26 DIAGNOSIS — I5043 Acute on chronic combined systolic (congestive) and diastolic (congestive) heart failure: Secondary | ICD-10-CM | POA: Diagnosis present

## 2022-01-26 DIAGNOSIS — Z953 Presence of xenogenic heart valve: Secondary | ICD-10-CM

## 2022-01-26 DIAGNOSIS — Z951 Presence of aortocoronary bypass graft: Secondary | ICD-10-CM

## 2022-01-26 DIAGNOSIS — F1090 Alcohol use, unspecified, uncomplicated: Secondary | ICD-10-CM

## 2022-01-26 DIAGNOSIS — E785 Hyperlipidemia, unspecified: Secondary | ICD-10-CM | POA: Diagnosis not present

## 2022-01-26 DIAGNOSIS — N179 Acute kidney failure, unspecified: Secondary | ICD-10-CM | POA: Diagnosis not present

## 2022-01-26 DIAGNOSIS — I110 Hypertensive heart disease with heart failure: Principal | ICD-10-CM | POA: Diagnosis present

## 2022-01-26 DIAGNOSIS — Z79899 Other long term (current) drug therapy: Secondary | ICD-10-CM | POA: Diagnosis not present

## 2022-01-26 DIAGNOSIS — R0602 Shortness of breath: Secondary | ICD-10-CM | POA: Diagnosis not present

## 2022-01-26 DIAGNOSIS — J449 Chronic obstructive pulmonary disease, unspecified: Secondary | ICD-10-CM | POA: Diagnosis present

## 2022-01-26 DIAGNOSIS — I5022 Chronic systolic (congestive) heart failure: Secondary | ICD-10-CM | POA: Diagnosis present

## 2022-01-26 DIAGNOSIS — Z7982 Long term (current) use of aspirin: Secondary | ICD-10-CM | POA: Diagnosis not present

## 2022-01-26 DIAGNOSIS — Z86010 Personal history of colonic polyps: Secondary | ICD-10-CM

## 2022-01-26 DIAGNOSIS — F17210 Nicotine dependence, cigarettes, uncomplicated: Secondary | ICD-10-CM | POA: Diagnosis present

## 2022-01-26 DIAGNOSIS — M7989 Other specified soft tissue disorders: Secondary | ICD-10-CM | POA: Diagnosis not present

## 2022-01-26 DIAGNOSIS — J431 Panlobular emphysema: Secondary | ICD-10-CM | POA: Diagnosis not present

## 2022-01-26 DIAGNOSIS — I4892 Unspecified atrial flutter: Secondary | ICD-10-CM | POA: Diagnosis present

## 2022-01-26 DIAGNOSIS — I083 Combined rheumatic disorders of mitral, aortic and tricuspid valves: Secondary | ICD-10-CM | POA: Diagnosis present

## 2022-01-26 DIAGNOSIS — Z23 Encounter for immunization: Secondary | ICD-10-CM | POA: Diagnosis not present

## 2022-01-26 DIAGNOSIS — I480 Paroxysmal atrial fibrillation: Secondary | ICD-10-CM | POA: Diagnosis present

## 2022-01-26 DIAGNOSIS — I517 Cardiomegaly: Secondary | ICD-10-CM | POA: Diagnosis not present

## 2022-01-26 DIAGNOSIS — Z789 Other specified health status: Secondary | ICD-10-CM | POA: Diagnosis not present

## 2022-01-26 DIAGNOSIS — I4891 Unspecified atrial fibrillation: Secondary | ICD-10-CM | POA: Diagnosis not present

## 2022-01-26 DIAGNOSIS — I248 Other forms of acute ischemic heart disease: Secondary | ICD-10-CM | POA: Diagnosis present

## 2022-01-26 DIAGNOSIS — R000 Tachycardia, unspecified: Secondary | ICD-10-CM

## 2022-01-26 DIAGNOSIS — I10 Essential (primary) hypertension: Secondary | ICD-10-CM | POA: Diagnosis present

## 2022-01-26 DIAGNOSIS — J9811 Atelectasis: Secondary | ICD-10-CM | POA: Diagnosis not present

## 2022-01-26 HISTORY — DX: Chronic systolic (congestive) heart failure: I50.22

## 2022-01-26 HISTORY — DX: Alcohol use, unspecified, uncomplicated: F10.90

## 2022-01-26 HISTORY — DX: Tobacco use: Z72.0

## 2022-01-26 HISTORY — DX: Syncope and collapse: R55

## 2022-01-26 HISTORY — DX: Other specified health status: Z78.9

## 2022-01-26 LAB — CBC
HCT: 45.3 % (ref 39.0–52.0)
Hemoglobin: 14.9 g/dL (ref 13.0–17.0)
MCH: 32.5 pg (ref 26.0–34.0)
MCHC: 32.9 g/dL (ref 30.0–36.0)
MCV: 98.7 fL (ref 80.0–100.0)
Platelets: 165 10*3/uL (ref 150–400)
RBC: 4.59 MIL/uL (ref 4.22–5.81)
RDW: 14 % (ref 11.5–15.5)
WBC: 8.5 10*3/uL (ref 4.0–10.5)
nRBC: 0 % (ref 0.0–0.2)

## 2022-01-26 LAB — BASIC METABOLIC PANEL
Anion gap: 7 (ref 5–15)
BUN: 14 mg/dL (ref 8–23)
CO2: 24 mmol/L (ref 22–32)
Calcium: 8.9 mg/dL (ref 8.9–10.3)
Chloride: 105 mmol/L (ref 98–111)
Creatinine, Ser: 0.78 mg/dL (ref 0.61–1.24)
GFR, Estimated: >60 mL/min (ref >60)
Glucose, Bld: 115 mg/dL — ABNORMAL HIGH (ref 70–99)
Potassium: 3.8 mmol/L (ref 3.5–5.1)
Sodium: 136 mmol/L (ref 135–145)

## 2022-01-26 LAB — URINALYSIS, ROUTINE W REFLEX MICROSCOPIC
Bilirubin Urine: NEGATIVE
Glucose, UA: NEGATIVE mg/dL
Hgb urine dipstick: NEGATIVE
Ketones, ur: NEGATIVE mg/dL
Leukocytes,Ua: NEGATIVE
Nitrite: NEGATIVE
Protein, ur: NEGATIVE mg/dL
Specific Gravity, Urine: 1.012 (ref 1.005–1.030)
pH: 5 (ref 5.0–8.0)

## 2022-01-26 LAB — CBG MONITORING, ED: Glucose-Capillary: 132 mg/dL — ABNORMAL HIGH (ref 70–99)

## 2022-01-26 LAB — TROPONIN I (HIGH SENSITIVITY)
Troponin I (High Sensitivity): 26 ng/L — ABNORMAL HIGH (ref <18)
Troponin I (High Sensitivity): 22 ng/L — ABNORMAL HIGH (ref <18)

## 2022-01-26 LAB — BRAIN NATRIURETIC PEPTIDE: B Natriuretic Peptide: 56.4 pg/mL (ref 0.0–100.0)

## 2022-01-26 MED ORDER — ALBUTEROL SULFATE (2.5 MG/3ML) 0.083% IN NEBU: 2.5000 mg | INHALATION_SOLUTION | Freq: Four times a day (QID) | RESPIRATORY_TRACT | Status: DC | PRN

## 2022-01-26 MED ORDER — ONDANSETRON HCL 4 MG PO TABS: 4.0000 mg | ORAL_TABLET | Freq: Four times a day (QID) | ORAL | Status: DC | PRN

## 2022-01-26 MED ORDER — SODIUM CHLORIDE 0.9% FLUSH
3.0000 mL | Freq: Two times a day (BID) | INTRAVENOUS | Status: DC
  Administered 2022-01-26 – 2022-01-28 (×5): 3 mL via INTRAVENOUS

## 2022-01-26 MED ORDER — ACETAMINOPHEN 650 MG RE SUPP: 650.0000 mg | Freq: Four times a day (QID) | RECTAL | Status: DC | PRN

## 2022-01-26 MED ORDER — LISINOPRIL-HYDROCHLOROTHIAZIDE 20-12.5 MG PO TABS: 1.0000 | ORAL_TABLET | Freq: Every day | ORAL | Status: DC

## 2022-01-26 MED ORDER — METOPROLOL TARTRATE 25 MG PO TABS
25.0000 mg | ORAL_TABLET | Freq: Two times a day (BID) | ORAL | Status: DC
  Administered 2022-01-26: 25 mg via ORAL
  Filled 2022-01-26: qty 1

## 2022-01-26 MED ORDER — LISINOPRIL 20 MG PO TABS
20.0000 mg | ORAL_TABLET | Freq: Every day | ORAL | Status: DC
  Administered 2022-01-26: 20 mg via ORAL
  Filled 2022-01-26: qty 1

## 2022-01-26 MED ORDER — ONDANSETRON HCL 4 MG/2ML IJ SOLN: 4.0000 mg | Freq: Four times a day (QID) | INTRAMUSCULAR | Status: DC | PRN

## 2022-01-26 MED ORDER — NICOTINE 21 MG/24HR TD PT24
21.0000 mg | MEDICATED_PATCH | Freq: Every day | TRANSDERMAL | Status: DC
  Administered 2022-01-26 – 2022-01-29 (×4): 21 mg via TRANSDERMAL
  Filled 2022-01-26 (×4): qty 1

## 2022-01-26 MED ORDER — ASPIRIN EC 81 MG PO TBEC
81.0000 mg | DELAYED_RELEASE_TABLET | Freq: Every day | ORAL | Status: DC
  Administered 2022-01-27 – 2022-01-29 (×3): 81 mg via ORAL
  Filled 2022-01-26 (×3): qty 1

## 2022-01-26 MED ORDER — ENOXAPARIN SODIUM 40 MG/0.4ML IJ SOSY
40.0000 mg | PREFILLED_SYRINGE | INTRAMUSCULAR | Status: DC
  Administered 2022-01-26 – 2022-01-28 (×3): 40 mg via SUBCUTANEOUS
  Filled 2022-01-26 (×3): qty 0.4

## 2022-01-26 MED ORDER — ALBUTEROL SULFATE HFA 108 (90 BASE) MCG/ACT IN AERS: 2.0000 | INHALATION_SPRAY | Freq: Four times a day (QID) | RESPIRATORY_TRACT | Status: DC | PRN

## 2022-01-26 MED ORDER — RANOLAZINE ER 500 MG PO TB12
1000.0000 mg | ORAL_TABLET | Freq: Two times a day (BID) | ORAL | Status: DC
  Administered 2022-01-27 – 2022-01-29 (×4): 1000 mg via ORAL
  Filled 2022-01-26 (×7): qty 2

## 2022-01-26 MED ORDER — SENNOSIDES-DOCUSATE SODIUM 8.6-50 MG PO TABS: 1.0000 | ORAL_TABLET | Freq: Every evening | ORAL | Status: DC | PRN

## 2022-01-26 MED ORDER — FUROSEMIDE 20 MG PO TABS: 20.0000 mg | ORAL_TABLET | Freq: Every day | ORAL | 0 refills | Status: DC

## 2022-01-26 MED ORDER — HYDROCHLOROTHIAZIDE 12.5 MG PO TABS: 12.5000 mg | ORAL_TABLET | Freq: Every day | ORAL | Status: DC

## 2022-01-26 MED ORDER — ACETAMINOPHEN 325 MG PO TABS
650.0000 mg | ORAL_TABLET | Freq: Four times a day (QID) | ORAL | Status: DC | PRN
Start: 2022-01-26 — End: 2022-01-29

## 2022-01-26 MED ORDER — ATORVASTATIN CALCIUM 10 MG PO TABS
20.0000 mg | ORAL_TABLET | Freq: Every day | ORAL | Status: DC
  Administered 2022-01-27 – 2022-01-29 (×3): 20 mg via ORAL
  Filled 2022-01-26 (×3): qty 2

## 2022-01-26 MED ORDER — FUROSEMIDE 10 MG/ML IJ SOLN
20.0000 mg | Freq: Once | INTRAMUSCULAR | Status: AC
  Administered 2022-01-26: 20 mg via INTRAVENOUS
  Filled 2022-01-26: qty 2

## 2022-01-26 NOTE — Hospital Course (Signed)
Blake Griffin is a 66 y.o. male with medical history significant for CAD s/p CABG, aortic stenosis s/p bioprosthetic AVR, LBBB, HTN, COPD, alcohol use, tobacco use who is admitted for evaluation of near syncope. ?

## 2022-01-26 NOTE — Assessment & Plan Note (Signed)
Reassess valve function on echocardiogram. ?

## 2022-01-26 NOTE — ED Notes (Signed)
Pt transported to VAS. ?

## 2022-01-26 NOTE — Progress Notes (Signed)
Right lower extremity venous duplex has been completed. ?Preliminary results can be found in CV Proc through chart review.  ?Results were given to Dr. Stevie Kern. ? ?01/26/22 5:02 PM ?Olen Cordial RVT   ?

## 2022-01-26 NOTE — Assessment & Plan Note (Signed)
History suggestive of orthostatic versus vasovagal presyncope.  He states that he did not lose consciousness.  He has not had any chest pain. ?-Check orthostatic vitals ?-Keep on telemetry ?-Update echocardiogram ?-PT/OT eval ?

## 2022-01-26 NOTE — Assessment & Plan Note (Signed)
Patient reports new lower extremity edema over the last week.  Last TTE 12/01/2020 showed EF 45-50% with G1DD.  CXR shows cardiomegaly with pulmonary vascular prominence. ?-IV Lasix 20 mg once tonight, monitor response ?-Update echocardiogram ?-Monitor strict I/O's and daily weights ?

## 2022-01-26 NOTE — ED Provider Notes (Signed)
?[facility] EMERGENCY DEPARTMENT ?Provider Note ? ? ?CSN: [id number] ?Arrival date & time: 01/26/22  1001 ? ?  ? ?History ? ?Chief Complaint  ?Patient presents with  ? Dizziness  ? ? ?Blake Griffin is a 66 y.o. male.  Presenting to ER due to episode of near syncope.  Patient states that last night while walking he suddenly felt extremely lightheaded, dizzy, short of breath and felt that he was going to pass out.  States that he did not completely pass out.  He sat down and the symptoms slowly seem to resolve.  This morning he was feeling better.  Still is having some intermittent very mild dizziness.  Currently does not have any symptoms.  He states that he does deal with intermittent episodes of dizziness but episode last night was distinctly different and worse. ? ? ? ?HPI ? ?  ? ?Home Medications ?Prior to Admission medications   ?Medication Sig Start Date End Date Taking? Authorizing Provider  ?furosemide (LASIX) 20 MG tablet Take 1 tablet (20 mg total) by mouth daily. 01/26/22  Yes Milagros Loll, MD  ?albuterol (PROVENTIL HFA;VENTOLIN HFA) 108 (90 Base) MCG/ACT inhaler Inhale 2 puffs into the lungs every 6 (six) hours as needed for wheezing or shortness of breath.     [provider]  ?aspirin EC 81 MG tablet Take 1 tablet (81 mg total) by mouth daily. 02/12/21   Revankar, Aundra Dubin, MD  ?atorvastatin (LIPITOR) 40 MG tablet Take 20 mg by mouth daily.    [provider]  ?D3-50 1.25 MG (50000 UT) capsule Take 50,000 Units by mouth once a week. 03/03/19   [provider]  ?lisinopril-hydrochlorothiazide (ZESTORETIC) 20-12.5 MG tablet Take 1 tablet by mouth daily. 02/12/21   Revankar, Aundra Dubin, MD  ?metoprolol tartrate (LOPRESSOR) 25 MG tablet Take 1 tablet (25 mg total) by mouth 2 (two) times daily. 02/12/21   Revankar, Aundra Dubin, MD  ?naproxen sodium (ALEVE) 220 MG tablet Take 440 mg by mouth daily.    [provider]  ?nitroGLYCERIN (NITROSTAT) 0.4 MG SL tablet Place  0.4 mg under the tongue every 5 (five) minutes as needed for chest pain.    [provider]  ?ranolazine (RANEXA) 1000 MG SR tablet Take 1 tablet (1,000 mg total) by mouth 2 (two) times daily. 02/12/21   Revankar, Aundra Dubin, MD  ?valACYclovir (VALTREX) 500 MG tablet Take 500 mg by mouth daily as needed for other (cold sores). Cold sores    [provider]  ?   ? ?Allergies    ?Patient has no known allergies.   ? ?Review of Systems   ?Review of Systems  ?Constitutional:  Negative for chills and fever.  ?HENT:  Negative for ear pain and sore throat.   ?Eyes:  Negative for pain and visual disturbance.  ?Respiratory:  Negative for cough and shortness of breath.   ?Cardiovascular:  Negative for chest pain and palpitations.  ?Gastrointestinal:  Negative for abdominal pain and vomiting.  ?Genitourinary:  Negative for dysuria and hematuria.  ?Musculoskeletal:  Negative for arthralgias and back pain.  ?Skin:  Negative for color change and rash.  ?Neurological:  Positive for dizziness and light-headedness. Negative for seizures and syncope.  ?All other systems reviewed and are negative. ? ?Physical Exam ?Updated Vital Signs ?BP (!) 188/62 (BP Location: Right Arm)   Pulse 65   Temp 98.2 ?F (36.8 ?C)   Resp 16   SpO2 95%  ?Physical Exam ?Vitals and nursing  note reviewed.  ?Constitutional:   ?   General: He is not in acute distress. ?   Appearance: He is well-developed.  ?HENT:  ?   Head: Normocephalic and atraumatic.  ?Eyes:  ?   Conjunctiva/sclera: Conjunctivae normal.  ?Cardiovascular:  ?   Rate and Rhythm: Normal rate and regular rhythm.  ?   Heart sounds: No murmur heard. ?Pulmonary:  ?   Effort: Pulmonary effort is normal. No respiratory distress.  ?   Breath sounds: Normal breath sounds.  ?Abdominal:  ?   Palpations: Abdomen is soft.  ?   Tenderness: There is no abdominal tenderness.  ?Musculoskeletal:     ?   General: No swelling.  ?   Cervical back: Neck supple.  ?Skin: ?   General: Skin is warm and  dry.  ?   Capillary Refill: Capillary refill takes less than 2 seconds.  ?Neurological:  ?   General: No focal deficit present.  ?   Mental Status: He is alert and oriented to person, place, and time.  ?Psychiatric:     ?   Mood and Affect: Mood normal.  ? ? ?ED Results / Procedures / Treatments   ?Labs ?(all labs ordered are listed, but only abnormal results are displayed) ?Labs Reviewed  ?BASIC METABOLIC PANEL - Abnormal; Notable for the following components:  ?    Result Value  ? Glucose, Bld 115 (*)   ? All other components within normal limits  ?CBG MONITORING, ED - Abnormal; Notable for the following components:  ? Glucose-Capillary 132 (*)   ? All other components within normal limits  ?TROPONIN I (HIGH SENSITIVITY) - Abnormal; Notable for the following components:  ? Troponin I (High Sensitivity) 26 (*)   ? All other components within normal limits  ?TROPONIN I (HIGH SENSITIVITY) - Abnormal; Notable for the following components:  ? Troponin I (High Sensitivity) 22 (*)   ? All other components within normal limits  ?CBC  ?URINALYSIS, ROUTINE W REFLEX MICROSCOPIC  ?BRAIN NATRIURETIC PEPTIDE  ? ? ?EKG ?EKG Interpretation ? ?Date/Time:  Saturday January 26 2022 10:19:14 EDT ?Ventricular Rate:  61 ?PR Interval:  180 ?QRS Duration: 164 ?QT Interval:  450 ?QTC Calculation: 453 ?R Axis:   100 ?Text Interpretation: Normal sinus rhythm Rightward axis Left bundle branch block Abnormal ECG When compared with ECG of 07-Aug-2018 07:04, no acute change Confirmed by Madalyn Rob [id number]) on 01/26/2022 3:09:46 PM ? ?Radiology ?DG Chest 2 View ? ?Result Date: 01/26/2022 ?CLINICAL DATA:  Shortness of breath EXAM: CHEST - 2 VIEW COMPARISON:  Chest x-ray 09/08/2018 FINDINGS: Mild cardiomegaly. Mediastinum appears stable. Calcified plaques in the aortic arch. Cardiac surgical changes and median sternotomy wires. Pulmonary vascular prominence and bibasilar linear scarring/subsegmental atelectasis. No pleural effusion or  pneumothorax. IMPRESSION: 1. Mild cardiomegaly with pulmonary vascular prominence. 2. Bibasilar linear scarring/subsegmental atelectasis. Electronically Signed   By: Ofilia Neas M.D.   On: 01/26/2022 18:22  ? ?CT HEAD WO CONTRAST ? ?Result Date: 01/26/2022 ?CLINICAL DATA:  Dizziness. EXAM: CT HEAD WITHOUT CONTRAST TECHNIQUE: Contiguous axial images were obtained from the base of the skull through the vertex without intravenous contrast. RADIATION DOSE REDUCTION: This exam was performed according to the departmental dose-optimization program which includes automated exposure control, adjustment of the mA and/or kV according to patient size and/or use of iterative reconstruction technique. COMPARISON:  02/06/2011. FINDINGS: Brain: No evidence of acute infarction, hemorrhage, hydrocephalus, extra-axial collection or mass lesion/mass effect. Mild periventricular white matter hypoattenuation is noted consistent with  chronic microvascular ischemic change. Vascular: No hyperdense vessel or unexpected calcification. Skull: Normal. Negative for fracture or focal lesion. Sinuses/Orbits: Globes and orbits are unremarkable. Sinuses are clear. Other: None. IMPRESSION: 1. No acute intracranial abnormalities. 2. Mild chronic microvascular ischemic change. Electronically Signed   By: Lajean Manes M.D.   On: 01/26/2022 11:24  ? ?VAS [place] LOWER EXTREMITY VENOUS (DVT) (7a-7p) ? ?Result Date: 01/26/2022 ? Lower Venous DVT Study Patient Name:  Blake Griffin  Date of Exam:   01/26/2022 Medical Rec #: [id number]      Accession #:    [id number] Date of Birth: [birth year]      Patient Gender: M Patient Age:   49 years Exam Location:  [facility] Procedure:      VAS [place] LOWER EXTREMITY VENOUS (DVT) Referring Phys: Madalyn Rob --------------------------------------------------------------------------------  Indications: Swelling.  Risk Factors: None identified. Limitations: Poor ultrasound/tissue interface. Comparison Study: No  prior studies. Performing Technologist: Oliver Hum RVT  Examination Guidelines: A complete evaluation includes B-mode imaging, spectral Doppler, color Doppler, and power Doppler as needed of all accessible portions

## 2022-01-26 NOTE — Assessment & Plan Note (Signed)
Reports drinking 2-3 ounces liquor nightly.  Monitor CIWA. ?

## 2022-01-26 NOTE — Assessment & Plan Note (Signed)
Smoking 1 pack/day.  Smoking cessation advised.  Nicotine patch ordered. ?

## 2022-01-26 NOTE — Assessment & Plan Note (Signed)
Continue atorvastatin

## 2022-01-26 NOTE — Assessment & Plan Note (Addendum)
Will plan to resume home lisinopril-HCTZ and Lopressor unless orthostatic vitals are positive. ?

## 2022-01-26 NOTE — Assessment & Plan Note (Signed)
Some faint wheezing on exam.  He is not hypoxic.  He reports smoking 1 pack/day. ?-Continue albuterol as needed ?-Smoking cessation advised ?

## 2022-01-26 NOTE — Discharge Instructions (Addendum)
Follow with Blake Griffin., MD in 5-7 days ? ?Please get a complete blood count and chemistry panel checked by your Primary MD at your next visit, and again as instructed by your Primary MD. Please get your medications reviewed and adjusted by your Primary MD. ? ?Please request your Primary MD to go over all Hospital Tests and Procedure/Radiological results at the follow up, please get all Hospital records sent to your Prim MD by signing hospital release before you go home. ? ?In some cases, there will be blood work, cultures and biopsy results pending at the time of your discharge. Please request that your primary care M.D. goes through all the records of your hospital data and follows up on these results. ? ?If you had Pneumonia of Lung problems at the Hospital: ?Please get a 2 view Chest X ray done in 6-8 weeks after hospital discharge or sooner if instructed by your Primary MD. ? ?If you have Congestive Heart Failure: ?Please call your Cardiologist or Primary MD anytime you have any of the following symptoms:  ?1) 3 pound weight gain in 24 hours or 5 pounds in 1 week  ?2) shortness of breath, with or without a dry hacking cough  ?3) swelling in the hands, feet or stomach  ?4) if you have to sleep on extra pillows at night in order to breathe ? ?Follow cardiac low salt diet and 1.5 lit/day fluid restriction. ? ?If you have diabetes ?Accuchecks 4 times/day, Once in AM empty stomach and then before each meal. ?Log in all results and show them to your primary doctor at your next visit. ?If any glucose reading is under 80 or above 300 call your primary MD immediately. ? ?If you have Seizure/Convulsions/Epilepsy: ?Please do not drive, operate heavy machinery, participate in activities at heights or participate in high speed sports until you have seen by Primary MD or a Neurologist and advised to do so again. ?Per [facility] statutes, patients with seizures are not allowed to drive until they have been  seizure-free for six months.  ?Use caution when using heavy equipment or power tools. Avoid working on ladders or at heights. Take showers instead of baths. Ensure the water temperature is not too high on the home water heater. Do not go swimming alone. Do not lock yourself in a room alone (i.e. bathroom). When caring for infants or small children, sit down when holding, feeding, or changing them to minimize risk of injury to the child in the event you have a seizure. Maintain good sleep hygiene. Avoid alcohol.  ? ?If you had Gastrointestinal Bleeding: ?Please ask your Primary MD to check a complete blood count within one week of discharge or at your next visit. Your endoscopic/colonoscopic biopsies that are pending at the time of discharge, will also need to followed by your Primary MD. ? ?Get Medicines reviewed and adjusted. ?Please take all your medications with you for your next visit with your Primary MD ? ?Please request your Primary MD to go over all hospital tests and procedure/radiological results at the follow up, please ask your Primary MD to get all Hospital records sent to his/her office. ? ?If you experience worsening of your admission symptoms, develop shortness of breath, life threatening emergency, suicidal or homicidal thoughts you must seek medical attention immediately by calling 911 or calling your MD immediately  if symptoms less severe. ? ?You must read complete instructions/literature along with all the possible adverse reactions/side effects for all the Medicines you  take and that have been prescribed to you. Take any new Medicines after you have completely understood and accpet all the possible adverse reactions/side effects.  ? ?Do not drive or operate heavy machinery when taking Pain medications.  ? ?Do not take more than prescribed Pain, Sleep and Anxiety Medications ? ?Special Instructions: If you have smoked or chewed Tobacco  in the last 2 yrs please stop smoking, stop any regular  Alcohol  and or any Recreational drug use. ? ?Wear Seat belts while driving. ? ?Please note ?You were cared for by a hospitalist during your hospital stay. If you have any questions about your discharge medications or the care you received while you were in the hospital after you are discharged, you can call the unit and asked to speak with the hospitalist on call if the hospitalist that took care of you is not available. Once you are discharged, your primary care physician will handle any further medical issues. Please note that NO REFILLS for any discharge medications will be authorized once you are discharged, as it is imperative that you return to your primary care physician (or establish a relationship with a primary care physician if you do not have one) for your aftercare needs so that they can reassess your need for medications and monitor your lab values. ? ?You can reach the hospitalist office at phone [phone] or fax [phone] ?  ?If you do not have a primary care physician, you can call [phone] for a physician referral. ? ?Activity: As tolerated with Full fall precautions use walker/cane & assistance as needed ? ?  ?Diet: low sodium ? ?Disposition Home  ?

## 2022-01-26 NOTE — Assessment & Plan Note (Signed)
Status post CABG.  Troponin minimally elevated and downtrending on repeat.  EKG without acute ischemic changes.  Patient denies any recent chest pain. ?-Continue aspirin, atorvastatin, Ranexa ?

## 2022-01-26 NOTE — ED Provider Triage Note (Signed)
Emergency Medicine Provider Triage Evaluation Note ? ?Blake Griffin , a 66 y.o. male  was evaluated in triage.  Pt complains of intermittent dizziness for the past few months.  History of hypertension, COPD, aortic valve replacement, CAD, hyperlipidemia. ? ?Says that the symptoms, out of nowhere and that he feels as though he may pass out.  Has not had a syncopal episode.  Says that the dizziness was worse last night ? ?Review of Systems  ?Positive: Dizziness ?Negative: Syncope, weakness ? ?Physical Exam  ?BP (!) 178/84   Pulse (!) 55   Temp 98.2 ?F (36.8 ?C)   Resp 20   SpO2 95%  ?Gen:   Awake, no distress   ?Resp:  Normal effort  ?MSK:   Moves extremities without difficulty  ?Other:  Neurologically intact.  Alert and oriented. ? ?Medical Decision Making  ?Medically screening exam initiated at 10:39 AM.  Appropriate orders placed.  JANARD CULP was informed that the remainder of the evaluation will be completed by another provider, this initial triage assessment does not replace that evaluation, and the importance of remaining in the ED until their evaluation is complete. ? ? ?  ?Saddie Benders, PA-C ?01/26/22 1040 ? ?

## 2022-01-26 NOTE — H&P (Signed)
?History and Physical  ? ? ?Blake Griffin [id number] DOB: [birth year] DOA: 01/26/2022 ? ?PCP: Helen Hashimoto., MD  ?Patient coming from: Home ? ?I have personally briefly reviewed patient's old medical records in [place] ? ?Chief Complaint: Near syncope ? ?HPI: ?Blake Griffin is a 66 y.o. male with medical history significant for CAD s/p CABG, aortic stenosis s/p bioprosthetic AVR, LBBB, HTN, COPD, alcohol use, tobacco use who presented to the ED for evaluation of near syncope. ? ?Patient states he was in his usual state of health until evening of 01/25/22.  He says he was sitting down playing his guitar.  Afterwards he got up and was walking across the room when he suddenly felt lightheaded and as if he was going to pass out.  He had associated shortness of breath and diaphoresis.  He says he did not fall or lose consciousness.  He denied any room spinning sensation.  He did not have any chest pain.  He also reports new swelling to both of his lower extremities over the last week.  He reports good urine output without dysuria.  He denies any recent diarrhea, nausea, vomiting, abdominal pain. ? ?He is a current smoker, reports 1 pack/day.  He also reports drinking 2-3 ounces of liquor nightly.  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  He does admit that some days he forgets to take his medications as he is no longer on his routine schedule when he was working. ? ?ED Course  Labs/Imaging on admission: I have personally reviewed following labs and imaging studies. ? ?Initial vitals showed BP 178/84, pulse 55, RR 20, temp 98.2 ?F, SPO2 95% on room air. ? ?Labs show sodium 136, potassium 3.8, bicarb 24, BUN 14, creatinine 0.70, serum glucose 115, WBC 8.5, hemoglobin 14.9, platelets 165,000, BNP 56.4, troponin 26 > 22.  Urinalysis negative for UTI. ? ?Right lower extremity venous ultrasound negative for evidence of DVT. ? ?CT head without contrast negative for acute  intracranial abnormality. ? ?2 view chest x-ray shows mild cardiomegaly with pulmonary vascular prominence and bibasilar linear scarring/subsegmental atelectasis. ? ?Patient was given IV Lasix 20 mg and the hospitalist service was consulted to admit for further evaluation and management of near syncope. ? ?Review of Systems: All systems reviewed and are negative except as documented in history of present illness above. ? ? ?Past Medical History:  ?Diagnosis Date  ? Abnormal nuclear cardiac imaging test   ? Anal fissure   ? Aortic stenosis   ? Benign essential HTN 08/28/2017  ? Bilateral carotid bruits   ? COPD (chronic obstructive pulmonary disease) ([place])   ? Coronary artery disease involving native coronary artery of native heart with angina pectoris ([place])   ? Dyslipidemia   ? ED (erectile dysfunction)   ? Erectile dysfunction 06/29/2018  ? Hemorrhoids   ? Hypertension   ? Hypogonadism in male   ? Left bundle branch block (LBBB) 08/06/2018  ? Leg cramps   ? Light headed 08/28/2017  ? Pain of left shoulder region   ? Petechiae   ? Pneumonia 2016  ? S/P aortic valve replacement with bioprosthetic valve 08/06/2018  ? 25 mm Edwards Intuity Elite stented bovine pericardial bioprosthetic tissue valve  [id number] Model: 8300AB  ? S/P AVR (aortic valve replacement)  08/06/2018  ? 25 mm Edwards Intuity Elite stented bovine pericardial bioprosthetic tissue valve  [id number] Model: 8300AB  ? S/P CABG x 1 08/06/2018  ?  SVG to RCA, EVH via right thigh  ? Smoking   ? Vitamin D deficiency   ? ? ?Past Surgical History:  ?Procedure Laterality Date  ? AORTIC VALVE REPLACEMENT N/A 08/06/2018  ? Procedure: AORTIC VALVE REPLACEMENT (AVR);  Surgeon: Rexene Alberts, MD;  Location: [place];  Service: Open Heart Surgery;  Laterality: N/A;  ? APPENDECTOMY    ? COLONOSCOPY W/ POLYPECTOMY    ? x 2  ? CORONARY ARTERY BYPASS GRAFT N/A 08/06/2018  ? Procedure: CORONARY ARTERY BYPASS GRAFTING (CABG) times one using right greater saphenous vein  harvested endoscopically and aortic valve replacement.;  Surgeon: Rexene Alberts, MD;  Location: [place];  Service: Open Heart Surgery;  Laterality: N/A;  ? CORONARY/GRAFT ANGIOGRAPHY N/A 11/21/2020  ? Procedure: CORONARY/GRAFT ANGIOGRAPHY;  Surgeon: Troy Sine, MD;  Location: [place] [facility];  Service: Cardiovascular;  Laterality: N/A;  ? Lung Biospy Right   ? lower lung  ? RIGHT/LEFT HEART CATH AND CORONARY ANGIOGRAPHY N/A 06/30/2018  ? Procedure: RIGHT/LEFT HEART CATH AND CORONARY ANGIOGRAPHY;  Surgeon: Belva Crome, MD;  Location: [place] [facility];  Service: Cardiovascular;  Laterality: N/A;  ? TEE WITHOUT CARDIOVERSION N/A 08/06/2018  ? Procedure: TRANSESOPHAGEAL ECHOCARDIOGRAM (TEE);  Surgeon: Rexene Alberts, MD;  Location: [facility];  Service: Open Heart Surgery;  Laterality: N/A;  ? TOOTH EXTRACTION N/A 07/30/2018  ? Procedure: Extraction of tooth #5 w/ Alveoloplasty and Gross debridement of remaining dentition.;  Surgeon: Lenn Cal, DDS;  Location: [place];  Service: Oral Surgery;  Laterality: N/A;  ? ? ?Social History: ? reports that he quit smoking about 3 years ago. His smoking use included cigarettes. He has a 50.00 pack-year smoking history. He has never used smokeless tobacco. He reports current alcohol use of about 14.0 standard drinks per week. He reports that he does not use drugs. ? ?No Known Allergies ? ?Family History  ?Problem Relation Age of Onset  ? Hypertension Mother   ? Coronary artery disease Mother   ? Hypertension Father   ? ? ? ?Prior to Admission medications   ?Medication Sig Start Date End Date Taking? Authorizing Provider  ?furosemide (LASIX) 20 MG tablet Take 1 tablet (20 mg total) by mouth daily. 01/26/22  Yes Lucrezia Starch, MD  ?albuterol (PROVENTIL HFA;VENTOLIN HFA) 108 (90 Base) MCG/ACT inhaler Inhale 2 puffs into the lungs every 6 (six) hours as needed for wheezing or shortness of breath.     [provider]  ?aspirin EC 81 MG tablet Take 1 tablet  (81 mg total) by mouth daily. 02/12/21   Revankar, Reita Cliche, MD  ?atorvastatin (LIPITOR) 40 MG tablet Take 20 mg by mouth daily.    [provider]  ?D3-50 1.25 MG (50000 UT) capsule Take 50,000 Units by mouth once a week. 03/03/19   [provider]  ?lisinopril-hydrochlorothiazide (ZESTORETIC) 20-12.5 MG tablet Take 1 tablet by mouth daily. 02/12/21   Revankar, Reita Cliche, MD  ?metoprolol tartrate (LOPRESSOR) 25 MG tablet Take 1 tablet (25 mg total) by mouth 2 (two) times daily. 02/12/21   Revankar, Reita Cliche, MD  ?naproxen sodium (ALEVE) 220 MG tablet Take 440 mg by mouth daily.    [provider]  ?nitroGLYCERIN (NITROSTAT) 0.4 MG SL tablet Place 0.4 mg under the tongue every 5 (five) minutes as needed for chest pain.    [provider]  ?ranolazine (RANEXA) 1000 MG SR tablet Take 1 tablet (1,000 mg total) by mouth 2 (two) times daily.  02/12/21   Revankar, Reita Cliche, MD  ?valACYclovir (VALTREX) 500 MG tablet Take 500 mg by mouth daily as needed for other (cold sores). Cold sores    [provider]  ? ? ?Physical Exam: ?Vitals:  ? 01/26/22 1016 01/26/22 1418 01/26/22 1552 01/26/22 1756  ?BP: (!) 178/84 (!) 142/106 (!) 174/86 (!) 188/62  ?Pulse: (!) 55 (!) 56 (!) 59 65  ?Resp: 20 18 18 16   ?Temp: 98.2 ?F (36.8 ?C)     ?SpO2: 95% 95% 94% 95%  ? ?Constitutional: Sitting up in bed, NAD, calm, comfortable ?Eyes: PERRL, lids and conjunctivae normal ?ENMT: Mucous membranes are moist. Posterior pharynx clear of any exudate or lesions.Normal dentition.  ?Neck: normal, supple, no masses. ?Respiratory: Distant and expiratory wheezing bilaterally. Normal respiratory effort. No accessory muscle use.  ?Cardiovascular: Regular rate and rhythm, no murmurs / rubs / gallops.  +2 bilateral lower extremity edema. 2+ pedal pulses. ?Abdomen: no tenderness, no masses palpated.  ?Musculoskeletal: no clubbing / cyanosis. No joint deformity upper and lower extremities. Good ROM, no contractures. Normal muscle  tone.  ?Skin: no rashes, lesions, ulcers. No induration ?Neurologic: CN 2-12 grossly intact. Sensation intact. Strength 5/5 in all 4.  ?Psychiatric: Alert and oriented x 3. Normal mood.  ? ?EKG: Personally reviewe

## 2022-01-26 NOTE — ED Triage Notes (Signed)
Pt reports intermittent dizziness x 3-5 months that is worse since last night.  Reports L arm tingling.  No arm drift.  Also reports R foot swelling x 1 week.  Chronic SOB. ?

## 2022-01-27 ENCOUNTER — Observation Stay (HOSPITAL_COMMUNITY): Payer: Medicare HMO

## 2022-01-27 DIAGNOSIS — Z7982 Long term (current) use of aspirin: Secondary | ICD-10-CM | POA: Diagnosis not present

## 2022-01-27 DIAGNOSIS — I5043 Acute on chronic combined systolic (congestive) and diastolic (congestive) heart failure: Secondary | ICD-10-CM | POA: Diagnosis present

## 2022-01-27 DIAGNOSIS — J431 Panlobular emphysema: Secondary | ICD-10-CM | POA: Diagnosis not present

## 2022-01-27 DIAGNOSIS — Z79899 Other long term (current) drug therapy: Secondary | ICD-10-CM | POA: Diagnosis not present

## 2022-01-27 DIAGNOSIS — I110 Hypertensive heart disease with heart failure: Secondary | ICD-10-CM | POA: Diagnosis present

## 2022-01-27 DIAGNOSIS — Z953 Presence of xenogenic heart valve: Secondary | ICD-10-CM | POA: Diagnosis not present

## 2022-01-27 DIAGNOSIS — I447 Left bundle-branch block, unspecified: Secondary | ICD-10-CM | POA: Diagnosis present

## 2022-01-27 DIAGNOSIS — I5022 Chronic systolic (congestive) heart failure: Secondary | ICD-10-CM

## 2022-01-27 DIAGNOSIS — N179 Acute kidney failure, unspecified: Secondary | ICD-10-CM | POA: Diagnosis present

## 2022-01-27 DIAGNOSIS — Z9049 Acquired absence of other specified parts of digestive tract: Secondary | ICD-10-CM | POA: Diagnosis not present

## 2022-01-27 DIAGNOSIS — I25119 Atherosclerotic heart disease of native coronary artery with unspecified angina pectoris: Secondary | ICD-10-CM | POA: Diagnosis present

## 2022-01-27 DIAGNOSIS — F17210 Nicotine dependence, cigarettes, uncomplicated: Secondary | ICD-10-CM | POA: Diagnosis present

## 2022-01-27 DIAGNOSIS — Z23 Encounter for immunization: Secondary | ICD-10-CM | POA: Diagnosis not present

## 2022-01-27 DIAGNOSIS — R42 Dizziness and giddiness: Secondary | ICD-10-CM | POA: Diagnosis present

## 2022-01-27 DIAGNOSIS — E785 Hyperlipidemia, unspecified: Secondary | ICD-10-CM | POA: Diagnosis present

## 2022-01-27 DIAGNOSIS — I248 Other forms of acute ischemic heart disease: Secondary | ICD-10-CM | POA: Diagnosis present

## 2022-01-27 DIAGNOSIS — Z789 Other specified health status: Secondary | ICD-10-CM | POA: Diagnosis not present

## 2022-01-27 DIAGNOSIS — I4892 Unspecified atrial flutter: Secondary | ICD-10-CM | POA: Diagnosis present

## 2022-01-27 DIAGNOSIS — Z8249 Family history of ischemic heart disease and other diseases of the circulatory system: Secondary | ICD-10-CM | POA: Diagnosis not present

## 2022-01-27 DIAGNOSIS — I480 Paroxysmal atrial fibrillation: Secondary | ICD-10-CM | POA: Diagnosis present

## 2022-01-27 DIAGNOSIS — J449 Chronic obstructive pulmonary disease, unspecified: Secondary | ICD-10-CM | POA: Diagnosis present

## 2022-01-27 DIAGNOSIS — R55 Syncope and collapse: Secondary | ICD-10-CM | POA: Diagnosis present

## 2022-01-27 DIAGNOSIS — I4891 Unspecified atrial fibrillation: Secondary | ICD-10-CM | POA: Diagnosis not present

## 2022-01-27 DIAGNOSIS — I083 Combined rheumatic disorders of mitral, aortic and tricuspid valves: Secondary | ICD-10-CM | POA: Diagnosis present

## 2022-01-27 DIAGNOSIS — I471 Supraventricular tachycardia: Secondary | ICD-10-CM | POA: Diagnosis not present

## 2022-01-27 DIAGNOSIS — Z951 Presence of aortocoronary bypass graft: Secondary | ICD-10-CM | POA: Diagnosis not present

## 2022-01-27 DIAGNOSIS — Z91119 Patient's noncompliance with dietary regimen due to unspecified reason: Secondary | ICD-10-CM | POA: Diagnosis not present

## 2022-01-27 DIAGNOSIS — Z86010 Personal history of colonic polyps: Secondary | ICD-10-CM | POA: Diagnosis not present

## 2022-01-27 HISTORY — DX: Unspecified atrial fibrillation: I48.91

## 2022-01-27 LAB — BASIC METABOLIC PANEL
Anion gap: 7 (ref 5–15)
BUN: 16 mg/dL (ref 8–23)
CO2: 26 mmol/L (ref 22–32)
Calcium: 8.9 mg/dL (ref 8.9–10.3)
Chloride: 105 mmol/L (ref 98–111)
Creatinine, Ser: 0.86 mg/dL (ref 0.61–1.24)
GFR, Estimated: >60 mL/min (ref >60)
Glucose, Bld: 98 mg/dL (ref 70–99)
Potassium: 4.1 mmol/L (ref 3.5–5.1)
Sodium: 138 mmol/L (ref 135–145)

## 2022-01-27 LAB — CBC
HCT: 47.3 % (ref 39.0–52.0)
Hemoglobin: 15.2 g/dL (ref 13.0–17.0)
MCH: 32.3 pg (ref 26.0–34.0)
MCHC: 32.1 g/dL (ref 30.0–36.0)
MCV: 100.6 fL — ABNORMAL HIGH (ref 80.0–100.0)
Platelets: 164 10*3/uL (ref 150–400)
RBC: 4.7 MIL/uL (ref 4.22–5.81)
RDW: 14 % (ref 11.5–15.5)
WBC: 9.1 10*3/uL (ref 4.0–10.5)
nRBC: 0 % (ref 0.0–0.2)

## 2022-01-27 LAB — ECHOCARDIOGRAM COMPLETE
AR max vel: 2.97 cm2
AV Area VTI: 2.16 cm2
AV Area mean vel: 2.4 cm2
AV Mean grad: 8 mmHg
AV Peak grad: 13.1 mmHg
Ao pk vel: 1.81 m/s
Area-P 1/2: 2.31 cm2
Height: 71 in
MV VTI: 2.67 cm2
S' Lateral: 3.4 cm

## 2022-01-27 LAB — CBG MONITORING, ED: Glucose-Capillary: 160 mg/dL — ABNORMAL HIGH (ref 70–99)

## 2022-01-27 LAB — MAGNESIUM: Magnesium: 2.2 mg/dL (ref 1.7–2.4)

## 2022-01-27 LAB — HIV ANTIBODY (ROUTINE TESTING W REFLEX): HIV Screen 4th Generation wRfx: NONREACTIVE

## 2022-01-27 MED ORDER — METOPROLOL TARTRATE 12.5 MG HALF TABLET: 12.5000 mg | ORAL_TABLET | Freq: Two times a day (BID) | ORAL | Status: DC

## 2022-01-27 MED ORDER — FUROSEMIDE 10 MG/ML IJ SOLN
40.0000 mg | Freq: Two times a day (BID) | INTRAMUSCULAR | Status: DC
Start: 2022-01-27 — End: 2022-01-29
  Administered 2022-01-27 – 2022-01-28 (×4): 40 mg via INTRAVENOUS
  Filled 2022-01-27 (×4): qty 4

## 2022-01-27 MED ORDER — METOPROLOL TARTRATE 12.5 MG HALF TABLET
12.5000 mg | ORAL_TABLET | Freq: Two times a day (BID) | ORAL | Status: DC
  Administered 2022-01-27 – 2022-01-28 (×2): 12.5 mg via ORAL
  Filled 2022-01-27 (×2): qty 1

## 2022-01-27 NOTE — Evaluation (Signed)
Physical Therapy Evaluation & Discharge ?Patient Details ?Name: Blake Griffin ?MRN: [id number] ?DOB: [birth year] ?Today's Date: 01/27/2022 ? ?History of Present Illness ? Pt is a 66 y.o. male who presented 01/26/22 with near syncope. No acute intracranial abnormalities noted with head CT. PMH: CAD s/p CABG, aortic stenosis s/p bioprosthetic AVR, LBBB, HTN, COPD, alcohol use, tobacco use ?  ?Clinical Impression ? Pt presents with condition above. PTA, he was IND without DME, living with his wife in a 1-level house with 9 STE. Currently, pt is at his baseline, displaying WFL strength, balance, and gait pattern. He is capable of being IND without UE support, LOB, or assistance with all functional mobility. See vitals below. Educated pt on limiting sodium intake, smoking cessation, and continued exercise/activity to improve health. All education completed and questions answered. PT will sign off. ? ?SpO2 down to 85% on RA with poor waveform, >/= 88% on RA with good waveform ? ?HR 70s-80s when taking orthostatic readings ? ?BP:  ?132/76 supine ?122/84 sitting ?129/77 standing ?138/108 standing ~3 min ? ?**denied lightheadedness/dizziness throughout ?   ?   ? ?Recommendations for follow up therapy are one component of a multi-disciplinary discharge planning process, led by the attending physician.  Recommendations may be updated based on patient status, additional functional criteria and insurance authorization. ? ?Follow Up Recommendations No PT follow up ? ?  ?Assistance Recommended at Discharge None  ?Patient can return home with the following ? Other (comment) (N/A) ? ?  ?Equipment Recommendations None recommended by PT  ?Recommendations for Other Services ?    ?  ?Functional Status Assessment Patient has not had a recent decline in their functional status  ? ?  ?Precautions / Restrictions Precautions ?Precautions: Other (comment) ?Precaution Comments: watch SpO2 ?Restrictions ?Weight Bearing Restrictions: No  ? ?   ? ?Mobility ? Bed Mobility ?Overal bed mobility: Modified Independent ?  ?  ?  ?  ?  ?  ?General bed mobility comments: HOB elevated, no need for assistance. ?  ? ?Transfers ?Overall transfer level: Independent ?Equipment used: None ?  ?  ?  ?  ?  ?  ?  ?General transfer comment: Able to come to stand without assistance or LOB ?  ? ?Ambulation/Gait ?Ambulation/Gait assistance: Supervision ?Gait Distance (Feet): 240 Feet ?Assistive device: None ?Gait Pattern/deviations: WFL(Within Functional Limits) ?Gait velocity: WFL ?Gait velocity interpretation: >4.37 ft/sec, indicative of normal walking speed ?  ?General Gait Details: No significant gait deviations noted or LOB bouts, Supervision for safety. ? ?Stairs ?  ?  ?  ?  ?  ? ?Wheelchair Mobility ?  ? ?Modified Rankin (Stroke Patients Only) ?  ? ?  ? ?Balance Overall balance assessment: No apparent balance deficits (not formally assessed) (sway 1x with initial cue to march in place, but recovered well and no further instability noted) ?  ?  ?  ?  ?  ?  ?  ?  ?  ?  ?  ?  ?  ?  ?  ?  ?  ?  ?   ? ? ? ?Pertinent Vitals/Pain Pain Assessment ?Pain Assessment: No/denies pain  ? ? ?Home Living Family/patient expects to be discharged to:: Private residence ?Living Arrangements: Spouse/significant other ?Available Help at Discharge: Family;Available 24 hours/day ?Type of Home: House ?Home Access: Stairs to enter ?Entrance Stairs-Rails: Can reach both ?Entrance Stairs-Number of Steps: 9 ?  ?Home Layout: One level ?Home Equipment: Grab bars - tub/shower ?   ?  ?Prior Function Prior  Level of Function : Independent/Modified Independent;Driving ?  ?  ?  ?  ?  ?  ?Mobility Comments: Does no use AD, no falls. ?  ?  ? ? ?Hand Dominance  ? Dominant Hand: Right ? ?  ?Extremity/Trunk Assessment  ? Upper Extremity Assessment ?Upper Extremity Assessment: Overall WFL for tasks assessed (MMT scores of 4+ to 5 grossly; denies numbness/tingling bil) ?  ? ?Lower Extremity Assessment ?Lower  Extremity Assessment: Overall WFL for tasks assessed (denies numbness/tingling bil) ?  ? ?Cervical / Trunk Assessment ?Cervical / Trunk Assessment: Normal  ?Communication  ? Communication: No difficulties  ?Cognition Arousal/Alertness: Awake/alert ?Behavior During Therapy: [facility] for tasks assessed/performed ?Overall Cognitive Status: Within Functional Limits for tasks assessed ?  ?  ?  ?  ?  ?  ?  ?  ?  ?  ?  ?  ?  ?  ?  ?  ?  ?  ?  ? ?  ?General Comments General comments (skin integrity, edema, etc.): SpO2 down to 85% on RA with poor waveform, >/= 88% on RA with good waveform; denied lightheadedness/dizziness throughout; BP 132/76 supine, 122/84 sitting, 129/77 standing, 138/108 standing ~3 min; HR 70s-80s when taking orthostatic readings; educated pt on limiting sodium intake, smoking cessation, and continued exercise/activity to improve health ? ?  ?Exercises    ? ?Assessment/Plan  ?  ?PT Assessment Patient does not need any further PT services  ?PT Problem List   ? ?   ?  ?PT Treatment Interventions     ? ?PT Goals (Current goals can be found in the Care Plan section)  ?Acute Rehab PT Goals ?Patient Stated Goal: to get healthier ?PT Goal Formulation: All assessment and education complete, DC therapy ?Time For Goal Achievement: 01/28/22 ?Potential to Achieve Goals: Good ? ?  ?Frequency   ?  ? ? ?Co-evaluation   ?  ?  ?  ?  ? ? ?  ?AM-PAC PT "6 Clicks" Mobility  ?Outcome Measure Help needed turning from your back to your side while in a flat bed without using bedrails?: None ?Help needed moving from lying on your back to sitting on the side of a flat bed without using bedrails?: None ?Help needed moving to and from a bed to a chair (including a wheelchair)?: None ?Help needed standing up from a chair using your arms (e.g., wheelchair or bedside chair)?: None ?Help needed to walk in hospital room?: A Little ?Help needed climbing 3-5 steps with a railing? : A Little ?6 Click Score: 22 ? ?  ?End of Session   ?Activity  Tolerance: Patient tolerated treatment well ?Patient left: in bed;with call bell/phone within reach ?Nurse Communication: Mobility status;Other (comment) (vitals) ?PT Visit Diagnosis: Dizziness and giddiness (R42) ?  ? ?Time: [id number] ?PT Time Calculation (min) (ACUTE ONLY): 17 min ? ? ?Charges:   PT Evaluation ?$PT Eval Low Complexity: 1 Low ?  ?  ?   ? ? ?Moishe Spice, PT, DPT ?Acute Rehabilitation Services  ?Pager: [phone] ?Office: [phone] ? ? ?Blake Griffin ?01/27/2022, 11:03 AM ? ?

## 2022-01-27 NOTE — Care Management Obs Status (Signed)
MEDICARE OBSERVATION STATUS NOTIFICATION ? ? ?Patient Details  ?Name: Blake Griffin ?MRN: [id number] ?Date of Birth: March 15, 1956 ? ? ?Medicare Observation Status Notification Given:  Yes ? ?Permission to sign Notice added to DC instructions for print out ? ?Verdell Carmine, RN ?01/27/2022, 9:19 AM ?

## 2022-01-27 NOTE — Progress Notes (Signed)
Pt ambulated in hallway approximately 470 feet without difficulty.  Pt tolerated ambulation well with no dizziness or shortness of breath to report.   ?

## 2022-01-27 NOTE — ED Notes (Signed)
Pt called out to nurse's station stating need to use the bathroom. By guided to restroom by another nurse. Afterwards pt ambulated approximately 250' in the hallway with myself. Denied CP, SOB, dizziness. Pt HR noted to be 88-90 and O2 89% on RA while walking. Pt placed back in bed and on cardiac monitor. NAD noted at this time.  ?

## 2022-01-27 NOTE — Progress Notes (Signed)
?  Echocardiogram ?2D Echocardiogram has been performed. ? ?Gerda Diss ?01/27/2022, 3:00 PM ?

## 2022-01-27 NOTE — Progress Notes (Signed)
OT Cancellation Note ? ?Patient Details ?Name: MARKANTHONY GEDNEY ?MRN: [id number] ?DOB: 1956-03-03 ? ? ?Cancelled Treatment:    Reason Eval/Treat Not Completed: OT screened, no needs identified, will sign off. PT assessed pt and reports that he is at his baseline. Pt was able to don socks, toilet, and complete functional mobility independently. He has no OT needs at this time. Thank you for the consult. Acute OT will sign off.  ? ?Gerhard Rappaport Elane Bing Plume ?01/27/2022, 10:59 AM ?

## 2022-01-27 NOTE — ED Notes (Addendum)
Pt noted to have HR 44, RN does note that pt was administered lopressor earlier in the evening. Pt reports feeling "flushed" but denies any pain or shortness of breath. Pt oxygen levels also noted to intermittently drop to 88-90% on room air and monitor frequently alarms "apnea". Pt placed on 2L via [place] and vital signs stable. aECG obtained by nursing tech and floor coverage provider, Dr. Julian Reil notified of the same. No new orders given at this time.  ?

## 2022-01-27 NOTE — ED Notes (Signed)
Breakfast order placed ?

## 2022-01-27 NOTE — Progress Notes (Signed)
Patient arrived from ED to 4E25 INAD.  Telemetry monitor applied and CCMD notified.  CHG bath and skin assessment completed.  Patient oriented to unit and room to include call light and phone.  Will continue to monitor. ?

## 2022-01-27 NOTE — Progress Notes (Signed)
?PROGRESS NOTE ? ?Blake Griffin [id number] DOB: 11-15-1955 DOA: 01/26/2022 ?PCP: Helen Hashimoto., MD ? ? LOS: 0 days  ? ?Brief Narrative / Interim history: ?Blake Griffin is a 66 y.o. male with medical history significant for CAD s/p CABG, aortic stenosis s/p bioprosthetic AVR, LBBB, HTN, COPD, alcohol use, tobacco use who is admitted for evaluation of near syncope. ? ?Subjective / 24h Interval events: ?Patient is doing well this afternoon, no chest pain, no nausea or vomiting.  While I was in the room interviewing, he developed a little bit of diaphoresis, and telemetry in the room showed significant tachycardia, A-fib with RVR with rates into the 150s. ? ?Assesement and Plan: ?Principal Problem: ?  Near syncope ?Active Problems: ?  Coronary artery disease involving native coronary artery of native heart with angina pectoris ([place]) ?  Acute on chronic heart failure with mid-range ejection fraction (HFmEF) ([place]) ?  Hypertension ?  S/P aortic valve replacement with bioprosthetic valve ?  COPD (chronic obstructive pulmonary disease) ([place]) ?  Dyslipidemia ?  Alcohol use ?  Tobacco use ?  Atrial fibrillation with RVR ([place]) ? ?Assessment and Plan: ?Principal problem ?Near syncope -possibly multifactorial in the setting of mild fluid overload as well as newly discovered A-fib with RVR that he witnessed this afternoon.  It was short-lived but he felt similarly as he felt at home so suspect PAF.  Continue telemetry ? ?Active problems ?Acute on chronic heart failure with mid-range ejection fraction (HFmEF) ([place]) -Patient reports new lower extremity edema over the last week.  Last TTE 12/01/2020 showed EF 45-50% with G1DD.  CXR shows cardiomegaly with pulmonary vascular prominence.  Continue IV furosemide, swelling is improving already.  A 2D echo done today shows an EF of 50-55%, normal RV, and repaired aortic valve looks well-positioned with normal structure and function. ? ?Paroxysmal A-fib, with RVR-an episode this  afternoon with rates into the 150s.  Consult cardiology in a.m. as this is new onset for him.  Continue metoprolol.  Possibly related to his daily EtOH intake ? ?Coronary artery disease involving native coronary artery of native heart with angina pectoris ([place]) -Status post CABG.  Troponin minimally elevated and downtrending on repeat.  Continue home medications ? ?Hypertension -hold lisinopril/HCTZ, continue metoprolol as well as furosemide ? ?S/P aortic valve replacement with bioprosthetic valve -valve looks good on echo ? ?COPD (chronic obstructive pulmonary disease) ([facility]) -stable, no wheezing.  Satting 89% and above with walking, suspect chronic ? ?Dyslipidemia -Continue atorvastatin. ? ?Tobacco use -Smoking 1 pack/day.  Smoking cessation advised.  Nicotine patch ordered. ? ?Alcohol use -Reports drinking 2-3 ounces liquor nightly.  Monitor CIWA. ? ? ?Scheduled Meds: ? aspirin EC  81 mg Oral Daily  ? atorvastatin  20 mg Oral Daily  ? enoxaparin (LOVENOX) injection  40 mg Subcutaneous Q24H  ? furosemide  40 mg Intravenous Q12H  ? metoprolol tartrate  12.5 mg Oral BID  ? nicotine  21 mg Transdermal Daily  ? ranolazine  1,000 mg Oral BID  ? sodium chloride flush  3 mL Intravenous Q12H  ? ?Continuous Infusions: ?PRN Meds:.acetaminophen **OR** acetaminophen, albuterol, ondansetron **OR** ondansetron (ZOFRAN) IV, senna-docusate ? ?Diet Orders (From admission, onward)  ? ?  Start     Ordered  ? 01/27/22 1316  Diet Heart Room service appropriate? Yes with Assist; Fluid consistency: Thin  Diet effective now       ?Question Answer Comment  ?Room service appropriate? Yes with Assist   ?Fluid consistency: Thin   ?  ?  01/27/22 1316  ? ?  ?  ? ?  ? ? ?DVT prophylaxis: enoxaparin (LOVENOX) injection 40 mg Start: 01/26/22 2200 ? ? ?Lab Results  ?Component Value Date  ? PLT 164 01/27/2022  ? ? ?  Code Status: Full Code ? ?Family Communication: Wife present at bedside ? ?Status is: Inpatient ? ?Remains inpatient appropriate  because: A fib RVR, diuresis ? ?Level of care: Telemetry Cardiac ? ?Consultants:  ?none ? ?Procedures:  ?2D echo ? ?Microbiology  ?none ? ?Antimicrobials: ?none  ? ? ?Objective: ?Vitals:  ? 01/27/22 1215 01/27/22 1236 01/27/22 1302 01/27/22 1514  ?BP: 138/78  140/86 (!) 153/94  ?Pulse: 71  70 84  ?Resp: 20  16 18   ?Temp:  98.9 ?F (37.2 ?C) 98.3 ?F (36.8 ?C) 98.4 ?F (36.9 ?C)  ?TempSrc:  Oral Oral Oral  ?SpO2: 93%  92% 91%  ?Height:   5\' 11"  (1.803 m)   ? ? ?Intake/Output Summary (Last 24 hours) at 01/27/2022 1805 ?Last data filed at 01/27/2022 1109 ?Gross per 24 hour  ?Intake --  ?Output 800 ml  ?Net -800 ml  ? ?Wt Readings from Last 3 Encounters:  ?10/09/21 102.9 kg  ?07/20/21 103.8 kg  ?03/29/21 106.1 kg  ? ? ?Examination: ? ?Constitutional: NAD ?Eyes: no scleral icterus ?ENMT: Mucous membranes are moist.  ?Neck: normal, supple ?Respiratory: Faint bibasilar crackles, no wheezing ?Cardiovascular: Regular rate and rhythm, no murmurs / rubs / gallops.  1+ pitting lower extremity edema ?Abdomen: non distended, no tenderness. Bowel sounds positive.  ?Musculoskeletal: no clubbing / cyanosis.  ?Skin: no rashes ?Neurologic: Nonfocal ? ?Data Reviewed: I have independently reviewed following labs and imaging studies  ? ?CBC ?Recent Labs  ?Lab 01/26/22 ?1048 01/27/22 ?0210  ?WBC 8.5 9.1  ?HGB 14.9 15.2  ?HCT 45.3 47.3  ?PLT 165 164  ?MCV 98.7 100.6*  ?MCH 32.5 32.3  ?MCHC 32.9 32.1  ?RDW 14.0 14.0  ? ? ?Recent Labs  ?Lab 01/26/22 ?1048 01/26/22 ?1604 01/27/22 ?0210  ?NA 136  --  138  ?K 3.8  --  4.1  ?CL 105  --  105  ?CO2 24  --  26  ?GLUCOSE 115*  --  98  ?BUN 14  --  16  ?CREATININE 0.78  --  0.86  ?CALCIUM 8.9  --  8.9  ?MG  --   --  2.2  ?BNP  --  56.4  --   ? ? ?------------------------------------------------------------------------------------------------------------------ ?No results for input(s): CHOL, HDL, LDLCALC, TRIG, CHOLHDL, LDLDIRECT in the last 72 hours. ? ?Lab Results  ?Component Value Date  ? HGBA1C 6.0 (H)  08/04/2018  ? ?------------------------------------------------------------------------------------------------------------------ ?No results for input(s): TSH, T4TOTAL, T3FREE, THYROIDAB in the last 72 hours. ? ?Invalid input(s): FREET3 ? ?Cardiac Enzymes ?No results for input(s): CKMB, TROPONINI, MYOGLOBIN in the last 168 hours. ? ?Invalid input(s): CK ?------------------------------------------------------------------------------------------------------------------ ?   ?Component Value Date/Time  ? BNP 56.4 01/26/2022 1604  ? ? ?CBG: ?Recent Labs  ?Lab 01/26/22 ?1823 01/27/22 ?0442  ?GLUCAP 132* 160*  ? ? ?No results found for this or any previous visit (from the past 240 hour(s)).  ? ?Radiology Studies: ?DG Chest 2 View ? ?Result Date: 01/26/2022 ?CLINICAL DATA:  Shortness of breath EXAM: CHEST - 2 VIEW COMPARISON:  Chest x-ray 09/08/2018 FINDINGS: Mild cardiomegaly. Mediastinum appears stable. Calcified plaques in the aortic arch. Cardiac surgical changes and median sternotomy wires. Pulmonary vascular prominence and bibasilar linear scarring/subsegmental atelectasis. No pleural effusion or pneumothorax. IMPRESSION: 1. Mild cardiomegaly with pulmonary vascular  prominence. 2. Bibasilar linear scarring/subsegmental atelectasis. Electronically Signed   By: Ofilia Neas M.D.   On: 01/26/2022 18:22  ? ?ECHOCARDIOGRAM COMPLETE ? ?Result Date: 01/27/2022 ?   ECHOCARDIOGRAM REPORT   Patient Name:   Blake Griffin Date of Exam: 01/27/2022 Medical Rec #:  [id number]     Height:       71.0 in Accession #:    [id number]    Weight:       226.8 lb Date of Birth:  August 18, 1956     BSA:          2.224 m? Patient Age:    47 years      BP:           140/86 mmHg Patient Gender: M             HR:           77 bpm. Exam Location:  Inpatient Procedure: 2D Echo, Cardiac Doppler and Color Doppler Indications:    CHF  History:        Patient has prior history of Echocardiogram examinations, most                 recent 12/01/2020. COPD,  Arrythmias:LBBB; Risk Factors:Current                 Smoker. Near syncope. 25 mm Edwards bioprosthetic valve in the                 aortic position. Procedure date 08/06/18.  Sonographer:    Clayton Lefort RDCS (

## 2022-01-28 DIAGNOSIS — I5022 Chronic systolic (congestive) heart failure: Secondary | ICD-10-CM

## 2022-01-28 DIAGNOSIS — E785 Hyperlipidemia, unspecified: Secondary | ICD-10-CM | POA: Diagnosis not present

## 2022-01-28 DIAGNOSIS — R55 Syncope and collapse: Secondary | ICD-10-CM | POA: Diagnosis not present

## 2022-01-28 DIAGNOSIS — I4891 Unspecified atrial fibrillation: Secondary | ICD-10-CM

## 2022-01-28 DIAGNOSIS — R000 Tachycardia, unspecified: Secondary | ICD-10-CM

## 2022-01-28 HISTORY — DX: Tachycardia, unspecified: R00.0

## 2022-01-28 LAB — BASIC METABOLIC PANEL
Anion gap: 8 (ref 5–15)
BUN: 22 mg/dL (ref 8–23)
CO2: 27 mmol/L (ref 22–32)
Calcium: 9.1 mg/dL (ref 8.9–10.3)
Chloride: 104 mmol/L (ref 98–111)
Creatinine, Ser: 0.97 mg/dL (ref 0.61–1.24)
GFR, Estimated: >60 mL/min (ref >60)
Glucose, Bld: 146 mg/dL — ABNORMAL HIGH (ref 70–99)
Potassium: 4.3 mmol/L (ref 3.5–5.1)
Sodium: 139 mmol/L (ref 135–145)

## 2022-01-28 LAB — TSH: TSH: 4.474 u[IU]/mL (ref 0.350–4.500)

## 2022-01-28 MED ORDER — METOPROLOL TARTRATE 12.5 MG HALF TABLET
12.5000 mg | ORAL_TABLET | Freq: Once | ORAL | Status: AC
  Administered 2022-01-28: 12.5 mg via ORAL
  Filled 2022-01-28: qty 1

## 2022-01-28 MED ORDER — METOPROLOL TARTRATE 25 MG PO TABS
25.0000 mg | ORAL_TABLET | Freq: Two times a day (BID) | ORAL | Status: DC
  Administered 2022-01-28 – 2022-01-29 (×2): 25 mg via ORAL
  Filled 2022-01-28 (×2): qty 1

## 2022-01-28 NOTE — Consult Note (Addendum)
?Cardiology Consultation:  ? ?Patient ID: Blake Griffin ?MRN: [id number]; DOB: [birth year] ? ?Admit date: 01/26/2022 ?Date of Consult: 01/28/2022 ? ?PCP:  Blake Floorampbell, Stephen D., MD ?  ?[facility] Providers ?Cardiologist:  Garwin Brothersajan R Revankar, MD   { ? ?Patient Profile:  ? ?Blake Griffin is a 66 y.o. male with a hx of CAD and aortic valve disorder s/p bioprosthetic AVR and CABG x1 in 2019, hypertension, hyperlipidemia, chronic left bundle branch block, COPD with ongoing tobacco smoking alcohol abuse who is being seen 01/28/2022 for the evaluation of tachycardia/near syncope at the request of Dr. Elvera LennoxGherghe. ? ?Last cardiac catheterization February 2022 showed severe native RCA disease.  Patent SVG to RCA prior to PDA takeoff.  There was a well-seated bovine pericardial tissue valve. ? ?History of Present Illness:  ? ?Blake Griffin was in usual state of health up until 4/14 evening when had sudden onset severe lightheadedness, shortness of breath and palpitation.  Felt like he is going to pass out but no true syncope.  No associated chest pain or weakness on one side of body.  He brought to emergency room for further evaluation and being admitted.  He had a recurrent episode yesterday evening while being evaluated by hospitalist.  Telemetry showing tachycardia.  Cardiology is asked for further evaluation.  He had a recurrent episode of tachycardia this morning around 3 AM as well.  He is symptomatic with tachycardia. ? ?Patient reports he also had similar episode 2 weeks ago.  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 ? ?He reported noncompliant with salt intake.  Has noted lower extremity edema for past 1 to 2 weeks.  Some orthopnea.  No syncope or melena.  No regular exercise.  He smokes about 1 pack of cigarette each day. ? ?High-sensitivity troponin 26>>22 ?Magnesium 2.2 ? ?Echo 01/27/2021 ? 1. Left ventricular ejection  fraction, by estimation, is 50 to 55%. The  ?left ventricle has low normal function. Left ventricular endocardial  ?border not optimally defined to evaluate regional wall motion. There is  ?mild concentric left ventricular  ?hypertrophy. Left ventricular diastolic parameters are consistent with  ?Grade I diastolic dysfunction (impaired relaxation).  ? 2. Right ventricular systolic function is normal. The right ventricular  ?size is normal. Tricuspid regurgitation signal is inadequate for assessing  ?PA pressure.  ? 3. The mitral valve is grossly normal. Trivial mitral valve  ?regurgitation.  ? 4. The aortic valve has been repaired/replaced. Aortic valve  ?regurgitation is not visualized. Echo findings are consistent with normal  ?structure and function of the aortic valve prosthesis. Aortic valve mean  ?gradient measures 8.0 mmHg.  ? 5. Aortic dilatation noted. There is borderline dilatation of the  ?ascending aorta, measuring 37 mm.  ? 6. The inferior vena cava is normal in size with greater than 50%  ?respiratory variability, suggesting right atrial pressure of 3 mmHg.  ? ?Comparison(s): Compared to prior TTE in 11/2020, there is no significant  ?change. Prior EF 45-50%; current EF ~50%. Prior mean AoV gradient 11mmHg,  ?currently 8mmHg.  ? ?Past Medical History:  ?Diagnosis Date  ? Abnormal nuclear cardiac imaging test   ? Anal fissure   ? Aortic stenosis   ? Benign essential HTN 08/28/2017  ? Bilateral carotid bruits   ? COPD (chronic obstructive pulmonary disease) (HCC)   ? Coronary artery disease involving native coronary artery of native  heart with angina pectoris (HCC)   ? Dyslipidemia   ? ED (erectile dysfunction)   ? Erectile dysfunction 06/29/2018  ? Hemorrhoids   ? Hypertension   ? Hypogonadism in male   ? Left bundle branch block (LBBB) 08/06/2018  ? Leg cramps   ? Light headed 08/28/2017  ? Pain of left shoulder region   ? Petechiae   ? Pneumonia 2016  ? S/P aortic valve replacement with bioprosthetic  valve 08/06/2018  ? 25 mm Edwards Intuity Elite stented bovine pericardial bioprosthetic tissue valve  [id number] Model: 8300AB  ? S/P AVR (aortic valve replacement)  08/06/2018  ? 25 mm Edwards Intuity Elite stented bovine pericardial bioprosthetic tissue valve  [id number] Model: 8300AB  ? S/P CABG x 1 08/06/2018  ? SVG to RCA, EVH via right thigh  ? Smoking   ? Vitamin D deficiency   ? ? ?Past Surgical History:  ?Procedure Laterality Date  ? AORTIC VALVE REPLACEMENT N/A 08/06/2018  ? Procedure: AORTIC VALVE REPLACEMENT (AVR);  Surgeon: Purcell Nails, MD;  Location: [facility] OR;  Service: Open Heart Surgery;  Laterality: N/A;  ? APPENDECTOMY    ? COLONOSCOPY W/ POLYPECTOMY    ? x 2  ? CORONARY ARTERY BYPASS GRAFT N/A 08/06/2018  ? Procedure: CORONARY ARTERY BYPASS GRAFTING (CABG) times one using right greater saphenous vein harvested endoscopically and aortic valve replacement.;  Surgeon: Purcell Nails, MD;  Location: [facility] OR;  Service: Open Heart Surgery;  Laterality: N/A;  ? CORONARY/GRAFT ANGIOGRAPHY N/A 11/21/2020  ? Procedure: CORONARY/GRAFT ANGIOGRAPHY;  Surgeon: Lennette Bihari, MD;  Location: [facility] INVASIVE CV LAB;  Service: Cardiovascular;  Laterality: N/A;  ? Lung Biospy Right   ? lower lung  ? RIGHT/LEFT HEART CATH AND CORONARY ANGIOGRAPHY N/A 06/30/2018  ? Procedure: RIGHT/LEFT HEART CATH AND CORONARY ANGIOGRAPHY;  Surgeon: Lyn Records, MD;  Location: [facility] INVASIVE CV LAB;  Service: Cardiovascular;  Laterality: N/A;  ? TEE WITHOUT CARDIOVERSION N/A 08/06/2018  ? Procedure: TRANSESOPHAGEAL ECHOCARDIOGRAM (TEE);  Surgeon: Purcell Nails, MD;  Location: [facility] OR;  Service: Open Heart Surgery;  Laterality: N/A;  ? TOOTH EXTRACTION N/A 07/30/2018  ? Procedure: Extraction of tooth #5 w/ Alveoloplasty and Gross debridement of remaining dentition.;  Surgeon: Charlynne Pander, DDS;  Location: [facility] OR;  Service: Oral Surgery;  Laterality: N/A;  ?  ? ?Inpatient Medications: ?Scheduled Meds: ? aspirin EC  81 mg Oral Daily   ? atorvastatin  20 mg Oral Daily  ? enoxaparin (LOVENOX) injection  40 mg Subcutaneous Q24H  ? furosemide  40 mg Intravenous Q12H  ? metoprolol tartrate  12.5 mg Oral BID  ? nicotine  21 mg Transdermal Daily  ? ranolazine  1,000 mg Oral BID  ? sodium chloride flush  3 mL Intravenous Q12H  ? ?Continuous Infusions: ? ?PRN Meds: ?acetaminophen **OR** acetaminophen, albuterol, ondansetron **OR** ondansetron (ZOFRAN) IV, senna-docusate ? ?Allergies:   No Known Allergies ? ?Social History:   ?Social History  ? ?Socioeconomic History  ? Marital status: Married  ?  Spouse name: Not on file  ? Number of children: 5  ? Years of education: Not on file  ? Highest education level: Not on file  ?Occupational History  ? Not on file  ?Tobacco Use  ? Smoking status: Former  ?  Packs/day: 1.00  ?  Years: 50.00  ?  Pack years: 50.00  ?  Types: Cigarettes  ?  Quit date: 08/06/2018  ?  Years since quitting: 3.4  ? Smokeless  tobacco: Never  ?Vaping Use  ? Vaping Use: Never used  ?Substance and Sexual Activity  ? Alcohol use: Yes  ?  Alcohol/week: 14.0 standard drinks  ?  Types: 14 Shots of liquor per week  ?  Comment: Bacardi rum daily  ? Drug use: Never  ? Sexual activity: Not on file  ?Other Topics Concern  ? Not on file  ?Social History Narrative  ? Not on file  ? ?Social Determinants of Health  ? ?Financial Resource Strain: Not on file  ?Food Insecurity: Not on file  ?Transportation Needs: Not on file  ?Physical Activity: Not on file  ?Stress: Not on file  ?Social Connections: Not on file  ?Intimate Partner Violence: Not on file  ?  ?Family History:   ? ?Family History  ?Problem Relation Age of Onset  ? Hypertension Mother   ? Coronary artery disease Mother   ? Hypertension Father   ?  ? ?ROS:  ?Please see the history of present illness.  ?All other ROS reviewed and negative.    ? ?Physical Exam/Data:  ? ?Vitals:  ? 01/28/22 5170 01/28/22 0329 01/28/22 0732 01/28/22 0921  ?BP:  111/66 109/76 117/74  ?Pulse:  79 78 87  ?Resp:  17 20    ?Temp:  97.9 ?F (36.6 ?C) 98 ?F (36.7 ?C)   ?TempSrc:  Oral Oral   ?SpO2:  90% 97%   ?Weight: 101.2 kg     ?Height:      ? ? ?Intake/Output Summary (Last 24 hours) at 01/28/2022 0952 ?Last data filed at 4/17

## 2022-01-28 NOTE — Progress Notes (Signed)
?PROGRESS NOTE ? ?Blake MuscaVernon R Ribas ZOX:[id number] DOB: [birth year] DOA: 01/26/2022 ?PCP: Wilmer Floorampbell, Stephen D., MD ? ? LOS: 1 day  ? ?Brief Narrative / Interim history: ?Blake Griffin is a 66 y.o. male with medical history significant for CAD s/p CABG, aortic stenosis s/p bioprosthetic AVR, LBBB, HTN, COPD, alcohol use, tobacco use who is admitted for evaluation of near syncope.  He was found to have significant tachycardia associated with his symptoms ? ?Subjective / 24h Interval events: ?Had another episode last night, felt flushed, palpitations, weakness ? ?Assesement and Plan: ?Principal Problem: ?  Near syncope ?Active Problems: ?  Coronary artery disease involving native coronary artery of native heart with angina pectoris (HCC) ?  Acute on chronic heart failure with mid-range ejection fraction (HFmEF) (HCC) ?  Hypertension ?  S/P aortic valve replacement with bioprosthetic valve ?  COPD (chronic obstructive pulmonary disease) (HCC) ?  Dyslipidemia ?  Alcohol use ?  Tobacco use ?  Tachycardia, unspecified ? ?Assessment and Plan: ?Principal problem ?Tachycardia, presyncope-patient with occasional bursts of SVT versus a flutter, associated with diaphoresis as well as presyncope like symptoms.  He had an episode last night and another one overnight.  2D echo was done, fairly unremarkable.  Cardiology consulted, not clear rhythm, SVT versus a flutter.  Did not recommend AC right now but 2 weeks of ZIO at discharge.  Increase metoprolol, continue diuresis with IV Lasix to optimize volume status. ? ?Active problems ?Acute on chronic heart failure with mid-range ejection fraction (HFmEF) (HCC) -Patient reports new lower extremity edema over the last week.  This is likely in the setting of dietary nonadherence to a low-sodium diet.  Last TTE 12/01/2020 showed EF 45-50% with G1DD.  CXR shows cardiomegaly with pulmonary vascular prominence.  Continue IV furosemide, swelling is improving already.  A 2D echo done today shows an  EF of 50-55%, normal RV, and repaired aortic valve looks well-positioned with normal structure and function. ? ?Coronary artery disease involving native coronary artery of native heart with angina pectoris (HCC) -Status post CABG.  Troponin minimally elevated and downtrending on repeat.  Continue home medications ? ?Hypertension -hold lisinopril/HCTZ, continue metoprolol as well as furosemide ? ?S/P aortic valve replacement with bioprosthetic valve -valve looks good on echo ? ?COPD (chronic obstructive pulmonary disease) (HCC) -stable, no wheezing.  Satting 89% and above with walking, suspect chronic ? ?Dyslipidemia -Continue atorvastatin. ? ?Tobacco use -Smoking 1 pack/day.  Smoking cessation advised.  Nicotine patch ordered. ? ?Alcohol use -Reports drinking 2-3 ounces liquor nightly.  No significant CIWA triggering ? ? ?Scheduled Meds: ? aspirin EC  81 mg Oral Daily  ? atorvastatin  20 mg Oral Daily  ? enoxaparin (LOVENOX) injection  40 mg Subcutaneous Q24H  ? furosemide  40 mg Intravenous Q12H  ? metoprolol tartrate  25 mg Oral BID  ? nicotine  21 mg Transdermal Daily  ? ranolazine  1,000 mg Oral BID  ? sodium chloride flush  3 mL Intravenous Q12H  ? ?Continuous Infusions: ?PRN Meds:.acetaminophen **OR** acetaminophen, albuterol, ondansetron **OR** ondansetron (ZOFRAN) IV, senna-docusate ? ?Diet Orders (From admission, onward)  ? ?  Start     Ordered  ? 01/27/22 1316  Diet Heart Room service appropriate? Yes with Assist; Fluid consistency: Thin  Diet effective now       ?Question Answer Comment  ?Room service appropriate? Yes with Assist   ?Fluid consistency: Thin   ?  ? 01/27/22 1316  ? ?  ?  ? ?  ? ? ?  DVT prophylaxis: Place and maintain sequential compression device Start: 01/28/22 1037 ?enoxaparin (LOVENOX) injection 40 mg Start: 01/26/22 2200 ? ? ?Lab Results  ?Component Value Date  ? PLT 164 01/27/2022  ? ? ?  Code Status: Full Code ? ?Family Communication: Wife present at bedside ? ?Status is:  Inpatient ? ?Remains inpatient appropriate because: Intermittent episodes of tachycardia, ongoing diuresis ? ?Level of care: Telemetry Cardiac ? ?Consultants:  ?Cardiology ? ?Procedures:  ?2D echo ? ?Microbiology  ?none ? ?Antimicrobials: ?none  ? ? ?Objective: ?Vitals:  ? 01/28/22 0329 01/28/22 0732 01/28/22 0921 01/28/22 1144  ?BP: 111/66 109/76 117/74 124/78  ?Pulse: 79 78 87 76  ?Resp: 17 20  20   ?Temp: 97.9 ?F (36.6 ?C) 98 ?F (36.7 ?C)  98 ?F (36.7 ?C)  ?TempSrc: Oral Oral  Oral  ?SpO2: 90% 97%  96%  ?Weight:      ?Height:      ? ? ?Intake/Output Summary (Last 24 hours) at 01/28/2022 1326 ?Last data filed at 01/28/2022 0200 ?Gross per 24 hour  ?Intake 250 ml  ?Output 1300 ml  ?Net -1050 ml  ? ? ?Wt Readings from Last 3 Encounters:  ?01/28/22 101.2 kg  ?10/09/21 102.9 kg  ?07/20/21 103.8 kg  ? ? ?Examination: ? ?Constitutional: NAD ?Eyes: lids and conjunctivae normal, no scleral icterus ?ENMT: mmm ?Neck: normal, supple ?Respiratory: clear to auscultation bilaterally, no wheezing, no crackles. Normal respiratory effort.  ?Cardiovascular: Regular rate and rhythm, no murmurs / rubs / gallops. No LE edema. ?Abdomen: soft, no distention, no tenderness. Bowel sounds positive.  ?Skin: no rashes ?Neurologic: no focal deficits, equal strength ? ?Data Reviewed: I have independently reviewed following labs and imaging studies  ? ?CBC ?Recent Labs  ?Lab 01/26/22 ?1048 01/27/22 ?0210  ?WBC 8.5 9.1  ?HGB 14.9 15.2  ?HCT 45.3 47.3  ?PLT 165 164  ?MCV 98.7 100.6*  ?MCH 32.5 32.3  ?MCHC 32.9 32.1  ?RDW 14.0 14.0  ? ? ? ?Recent Labs  ?Lab 01/26/22 ?1048 01/26/22 ?1604 01/27/22 ?0210 01/28/22 ?0356  ?NA 136  --  138 139  ?K 3.8  --  4.1 4.3  ?CL 105  --  105 104  ?CO2 24  --  26 27  ?GLUCOSE 115*  --  98 146*  ?BUN 14  --  16 22  ?CREATININE 0.78  --  0.86 0.97  ?CALCIUM 8.9  --  8.9 9.1  ?MG  --   --  2.2  --   ?TSH  --   --  4.474  --   ?BNP  --  56.4  --   --    ? ? ? ?------------------------------------------------------------------------------------------------------------------ ?No results for input(s): CHOL, HDL, LDLCALC, TRIG, CHOLHDL, LDLDIRECT in the last 72 hours. ? ?Lab Results  ?Component Value Date  ? HGBA1C 6.0 (H) 08/04/2018  ? ?------------------------------------------------------------------------------------------------------------------ ?Recent Labs  ?  01/27/22 ?0210  ?TSH 4.474  ? ? ?Cardiac Enzymes ?No results for input(s): CKMB, TROPONINI, MYOGLOBIN in the last 168 hours. ? ?Invalid input(s): CK ?------------------------------------------------------------------------------------------------------------------ ?   ?Component Value Date/Time  ? BNP 56.4 01/26/2022 1604  ? ? ?CBG: ?Recent Labs  ?Lab 01/26/22 ?1823 01/27/22 ?0442  ?GLUCAP 132* 160*  ? ? ? ?No results found for this or any previous visit (from the past 240 hour(s)).  ? ?Radiology Studies: ?ECHOCARDIOGRAM COMPLETE ? ?Result Date: 01/27/2022 ?   ECHOCARDIOGRAM REPORT   Patient Name:   MACLEAN FOISTER Dorwart Date of Exam: 01/27/2022 Medical Rec #:  01/29/2022  Height:       71.0 in Accession #:    [id number]    Weight:       226.8 lb Date of Birth:  02-13-1956     BSA:          2.224 m? Patient Age:    66 years      BP:           140/86 mmHg Patient Gender: M             HR:           77 bpm. Exam Location:  Inpatient Procedure: 2D Echo, Cardiac Doppler and Color Doppler Indications:    CHF  History:        Patient has prior history of Echocardiogram examinations, most                 recent 12/01/2020. COPD, Arrythmias:LBBB; Risk Factors:Current                 Smoker. Near syncope. 25 mm Edwards bioprosthetic valve in the                 aortic position. Procedure date 08/06/18.  Sonographer:    Ross Ludwig RDCS (AE) Referring Phys: [id number] Daylene Katayama [facility] IMPRESSIONS  1. Left ventricular ejection fraction, by estimation, is 50 to 55%. The left ventricle has low normal function. Left ventricular  endocardial border not optimally defined to evaluate regional wall motion. There is mild concentric left ventricular hypertrophy. Left ventricular diastolic parameters are consistent with Grade I diastolic dysfunction (impaired relaxation).  2. Right ventricular systolic function is normal.

## 2022-01-28 NOTE — Progress Notes (Signed)
Mobility Specialist Progress Note ? ? 01/28/22 1321  ?Mobility  ?Activity Ambulated independently in hallway  ?Level of Assistance Independent after set-up  ?Assistive Device None  ?Distance Ambulated (ft) 470 ft  ?Activity Response Tolerated well  ?$Mobility charge 1 Mobility  ? ? ?Pt requesting to go on another walk. Asymptomatic throughout, returned back to bed w/ call bell placed in reach. ? ?Holland Falling ?Mobility Specialist ?Phone Number [phone] ? ?

## 2022-01-28 NOTE — Progress Notes (Signed)
Mobility Specialist Progress Note ? ? 01/28/22 1147  ?Mobility  ?Activity Ambulated independently in hallway  ?Level of Assistance Independent after set-up  ?Assistive Device None  ?Distance Ambulated (ft) 470 ft  ?Activity Response Tolerated well  ?$Mobility charge 1 Mobility  ? ?Pre Mobility: 78 HR, 110/89 BP, 95% SpO2 ?During Mobility: 86 HR, 91% SpO2 ?Post Mobility: 72 HR, 124/78 BP, 94% SpO2 ? ?Received pt in bed having no complaints and agreeable to mobility. Asymptomatic throughout ambulation, returned back to bed w/ call bell in reach and all needs met. ? ?Blake Griffin ?Mobility Specialist ?Phone Number [phone] ? ?

## 2022-01-29 ENCOUNTER — Other Ambulatory Visit: Payer: Self-pay | Admitting: Physician Assistant

## 2022-01-29 ENCOUNTER — Inpatient Hospital Stay (INDEPENDENT_AMBULATORY_CARE_PROVIDER_SITE_OTHER): Payer: Medicare HMO

## 2022-01-29 DIAGNOSIS — J431 Panlobular emphysema: Secondary | ICD-10-CM | POA: Diagnosis not present

## 2022-01-29 DIAGNOSIS — Z789 Other specified health status: Secondary | ICD-10-CM

## 2022-01-29 DIAGNOSIS — I4892 Unspecified atrial flutter: Secondary | ICD-10-CM

## 2022-01-29 DIAGNOSIS — R42 Dizziness and giddiness: Secondary | ICD-10-CM

## 2022-01-29 DIAGNOSIS — R55 Syncope and collapse: Secondary | ICD-10-CM

## 2022-01-29 DIAGNOSIS — I4891 Unspecified atrial fibrillation: Secondary | ICD-10-CM

## 2022-01-29 LAB — BASIC METABOLIC PANEL
Anion gap: 10 (ref 5–15)
BUN: 28 mg/dL — ABNORMAL HIGH (ref 8–23)
CO2: 30 mmol/L (ref 22–32)
Calcium: 8.8 mg/dL — ABNORMAL LOW (ref 8.9–10.3)
Chloride: 96 mmol/L — ABNORMAL LOW (ref 98–111)
Creatinine, Ser: 1.25 mg/dL — ABNORMAL HIGH (ref 0.61–1.24)
GFR, Estimated: >60 mL/min (ref >60)
Glucose, Bld: 180 mg/dL — ABNORMAL HIGH (ref 70–99)
Potassium: 3.7 mmol/L (ref 3.5–5.1)
Sodium: 136 mmol/L (ref 135–145)

## 2022-01-29 LAB — GLUCOSE, CAPILLARY: Glucose-Capillary: 144 mg/dL — ABNORMAL HIGH (ref 70–99)

## 2022-01-29 MED ORDER — LISINOPRIL 20 MG PO TABS: 20.0000 mg | ORAL_TABLET | Freq: Every day | ORAL | 1 refills | Status: DC

## 2022-01-29 MED ORDER — FUROSEMIDE 20 MG PO TABS: 20.0000 mg | ORAL_TABLET | Freq: Every day | ORAL | 1 refills | Status: DC

## 2022-01-29 MED ORDER — PNEUMOCOCCAL 20-VAL CONJ VACC 0.5 ML IM SUSY
0.5000 mL | PREFILLED_SYRINGE | INTRAMUSCULAR | Status: AC | PRN
  Administered 2022-01-29: 0.5 mL via INTRAMUSCULAR
  Filled 2022-01-29 (×3): qty 0.5

## 2022-01-29 MED ORDER — NICOTINE 21 MG/24HR TD PT24: 21.0000 mg | MEDICATED_PATCH | Freq: Every day | TRANSDERMAL | 0 refills | Status: DC

## 2022-01-29 NOTE — Progress Notes (Signed)
Plan of care reviewed. Pt is progressing. He is hemodynamically stable, NSR on the monitor. He is able to sleep well, no complaints tonight. We continue to monitor. ? ?Filiberto Pinks, RN ?

## 2022-01-29 NOTE — Discharge Summary (Signed)
? ?Physician Discharge Summary  ?Blake Griffin [id number] DOB: 25-May-1956 DOA: 01/26/2022 ? ?PCP: Helen Hashimoto., MD ? ?Admit date: 01/26/2022 ?Discharge date: 01/29/2022 ? ?Admitted From: home ?Disposition:  home ? ?Recommendations for Outpatient Follow-up:  ?Follow up with PCP in 1-2 weeks ?Please obtain BMP/CBC in one week ?Follow up with cardiology in 1-2 weeks ? ?Home Health: none ?Equipment/Devices: none ? ?Discharge Condition: stable ?CODE STATUS: Full code ?Diet recommendation: low sodium ? ?HPI: Per admitting MD, ?Blake Griffin is a 66 y.o. male with medical history significant for CAD s/p CABG, aortic stenosis s/p bioprosthetic AVR, LBBB, HTN, COPD, alcohol use, tobacco use who presented to the ED for evaluation of near syncope. Patient states he was in his usual state of health until evening of 01/25/22.  He says he was sitting down playing his guitar.  Afterwards he got up and was walking across the room when he suddenly felt lightheaded and as if he was going to pass out.  He had associated shortness of breath and diaphoresis.  He says he did not fall or lose consciousness.  He denied any room spinning sensation.  He did not have any chest pain.  He also reports new swelling to both of his lower extremities over the last week.  He reports good urine output without dysuria.  He denies any recent diarrhea, nausea, vomiting, abdominal pain. He is a current smoker, reports 1 pack/day.  He also reports drinking 2-3 ounces of liquor nightly.  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  He does admit that some days he forgets to take his medications as he is no longer on his routine schedule when he was working. ? ?Hospital Course / Discharge diagnoses: ?Principal Problem: ?  Near syncope ?Active Problems: ?  Coronary artery disease involving native coronary artery of native heart with angina pectoris ([place]) ?  Acute on chronic heart failure with mid-range ejection fraction  (HFmEF) ([place]) ?  Hypertension ?  S/P aortic valve replacement with bioprosthetic valve ?  COPD (chronic obstructive pulmonary disease) ([place]) ?  Dyslipidemia ?  Alcohol use ?  Tobacco use ?  Tachycardia, unspecified ? ? ?Assessment and Plan: ?Principal problem ?Tachycardia, presyncope-patient with occasional bursts of SVT versus a flutter, associated with diaphoresis as well as presyncope like symptoms.  He had couple of episodes while hospitalized as well.  2D echo was done, fairly unremarkable.  Cardiology consulted, not clear rhythm, SVT versus a flutter.  Did not recommend AC right now but 2 weeks of ZIO at discharge.  Once diuresed, he had no further episodes.  He is tolerating metoprolol well, will be discharged home in stable condition ?  ?Active problems ?Acute on chronic combined CHF -Patient reports new lower extremity edema over the last week.  This is likely in the setting of dietary nonadherence to a low-sodium diet.  Last TTE 12/01/2020 showed EF 45-50% with G1DD.  CXR shows cardiomegaly with pulmonary vascular prominence.  He has received IV furosemide and fluid overload resolved.  He is euvolemic.  Creatinine slightly up but that is due to IV Lasix. A 2D echo done during this hospitalization shows an EF of 50-55%, normal RV, and repaired aortic valve looks well-positioned with normal structure and function.  Discussed with cardiology, will replace HCTZ with furosemide.  Patient was counseled for daily weights.  He will be arranged with a Zio patch by cardiology as an outpatient ?Coronary artery disease involving native  coronary artery of native heart with angina pectoris ([place]) -Status post CABG.  Troponin minimally elevated and downtrending on repeat.  This is likely demand ischemia.  Continue home medications ?Hypertension -continue metoprolol, lisinopril, HCTZ will be changed to furosemide ?S/P aortic valve replacement with bioprosthetic valve -valve looks good on echo ?COPD (chronic obstructive  pulmonary disease) ([facility]) -stable, no wheezing.  ?Dyslipidemia -Continue atorvastatin. ?Tobacco use -Smoking 1 pack/day.  Smoking cessation advised.  Nicotine patch ordered. ?Alcohol use -Reports drinking 2-3 ounces liquor nightly.  No significant CIWA triggering.  Recommend cessation ? ?Sepsis ruled out ? ?Discharge Instructions ? ? ?Allergies as of 01/29/2022   ?No Known Allergies ?  ? ?  ?Medication List  ?  ? ?STOP taking these medications   ? ?lisinopril-hydrochlorothiazide 20-12.5 MG tablet ?Commonly known as: ZESTORETIC ?  ?naproxen sodium 220 MG tablet ?Commonly known as: ALEVE ?  ? ?  ? ?TAKE these medications   ? ?albuterol 108 (90 Base) MCG/ACT inhaler ?Commonly known as: VENTOLIN HFA ?Inhale 2 puffs into the lungs every 6 (six) hours as needed for wheezing or shortness of breath. ?  ?aspirin EC 81 MG tablet ?Take 1 tablet (81 mg total) by mouth daily. ?  ?atorvastatin 40 MG tablet ?Commonly known as: LIPITOR ?Take 20 mg by mouth daily. ?  ?D3-50 1.25 MG (50000 UT) capsule ?Generic drug: Cholecalciferol ?Take 50,000 Units by mouth once a week. No set day ?  ?furosemide 20 MG tablet ?Commonly known as: Lasix ?Take 1 tablet (20 mg total) by mouth daily. ?  ?lisinopril 20 MG tablet ?Commonly known as: ZESTRIL ?Take 1 tablet (20 mg total) by mouth daily. ?  ?metoprolol tartrate 25 MG tablet ?Commonly known as: LOPRESSOR ?Take 1 tablet (25 mg total) by mouth 2 (two) times daily. ?  ?nitroGLYCERIN 0.4 MG SL tablet ?Commonly known as: NITROSTAT ?Place 0.4 mg under the tongue every 5 (five) minutes as needed for chest pain. ?  ?ranolazine 1000 MG SR tablet ?Commonly known as: RANEXA ?Take 1 tablet (1,000 mg total) by mouth 2 (two) times daily. ?  ?valACYclovir 500 MG tablet ?Commonly known as: VALTREX ?Take 500 mg by mouth daily as needed for other (cold sores). Cold sores ?  ? ?  ? ? Follow-up Information   ? ? Helen Hashimoto., MD .   ?Specialty: Internal Medicine ?Contact information: ?[address] ?[address] ?[place] [address] ?[phone] ? ? ?  ?  ? ? Revankar, Reita Cliche, MD Follow up on 03/19/2022.   ?Specialty: Cardiology ?Why: @8 :20am for follow up. Clinic will call with monitor set up. ?Contact information: ?[address] ?[place] [address] ?[phone] ? ? ?  ?  ? ?  ?  ? ?  ? ? ?Consultations: ?Cardiology  ? ?Procedures/Studies: ? ?DG Chest 2 View ? ?Result Date: 01/26/2022 ?CLINICAL DATA:  Shortness of breath EXAM: CHEST - 2 VIEW COMPARISON:  Chest x-ray 09/08/2018 FINDINGS: Mild cardiomegaly. Mediastinum appears stable. Calcified plaques in the aortic arch. Cardiac surgical changes and median sternotomy wires. Pulmonary vascular prominence and bibasilar linear scarring/subsegmental atelectasis. No pleural effusion or pneumothorax. IMPRESSION: 1. Mild cardiomegaly with pulmonary vascular prominence. 2. Bibasilar linear scarring/subsegmental atelectasis. Electronically Signed   By: Ofilia Neas M.D.   On: 01/26/2022 18:22  ? ?CT HEAD WO CONTRAST ? ?Result Date: 01/26/2022 ?CLINICAL DATA:  Dizziness. EXAM: CT HEAD WITHOUT CONTRAST TECHNIQUE: Contiguous axial images were obtained from the base of the skull through the vertex without intravenous contrast. RADIATION DOSE  REDUCTION: This exam was performed according to the departmental dose-optimization program which includes automated exposure control, adjustment of the mA and/or kV according to patient size and/or use of iterative reconstruction technique. COMPARISON:  02/06/2011. FINDINGS: Brain: No evidence of acute infarction, hemorrhage, hydrocephalus, extra-axial collection or mass lesion/mass effect. Mild periventricular white matter hypoattenuation is noted consistent with chronic microvascular ischemic change. Vascular: No hyperdense vessel or unexpected calcification. Skull: Normal. Negative for fracture or focal lesion. Sinuses/Orbits: Globes and orbits are unremarkable. Sinuses are clear. Other: None. IMPRESSION: 1. No acute  intracranial abnormalities. 2. Mild chronic microvascular ischemic change. Electronically Signed   By: Lajean Manes M.D.   On: 01/26/2022 11:24  ? ?ECHOCARDIOGRAM COMPLETE ? ?Result Date: 01/27/2022 ?   ECHOCARDIOGRAM

## 2022-01-29 NOTE — Progress Notes (Signed)
Mobility Specialist Progress Note ? ? 01/29/22 1106  ?Mobility  ?Activity Ambulated independently in hallway  ?Level of Assistance Independent after set-up  ?Assistive Device None  ?Distance Ambulated (ft) 470 ft  ?Activity Response Tolerated well  ?$Mobility charge 1 Mobility  ? ?Pre Mobility: 64 HR ?During Mobility: 78 HR, 98% SpO2 ?Post Mobility: 61 HR ? ?Pt received in chair w/o complaint. Asx during ambulation, returned back to EOB in preparation for d/c. ? ?Frederico Hamman ?Mobility Specialist ?Phone Number [phone] ? ?

## 2022-01-29 NOTE — Progress Notes (Addendum)
? ?Progress Note ? ?Patient Name: Blake Griffin ?Date of Encounter: 01/29/2022 ? ?[facility] Cardiologist: Jenean Lindau, MD  ? ?Subjective  ? ?No recurrent tachycardia. No chest pain or dyspnea.  ? ?Inpatient Medications  ?  ?Scheduled Meds: ? aspirin EC  81 mg Oral Daily  ? atorvastatin  20 mg Oral Daily  ? enoxaparin (LOVENOX) injection  40 mg Subcutaneous Q24H  ? metoprolol tartrate  25 mg Oral BID  ? nicotine  21 mg Transdermal Daily  ? ranolazine  1,000 mg Oral BID  ? sodium chloride flush  3 mL Intravenous Q12H  ? ?Continuous Infusions: ? ?PRN Meds: ?acetaminophen **OR** acetaminophen, albuterol, ondansetron **OR** ondansetron (ZOFRAN) IV, pneumococcal 20-valent conjugate vaccine, senna-docusate  ? ?Vital Signs  ?  ?Vitals:  ? 01/28/22 2044 01/29/22 0004 01/29/22 0548 01/29/22 0835  ?BP: 137/79 (!) 117/52 139/75 126/72  ?Pulse: 72 70 76 88  ?Resp: 20 20 19 20   ?Temp: 98.2 ?F (36.8 ?C) 98 ?F (36.7 ?C) 98.2 ?F (36.8 ?C) 98.2 ?F (36.8 ?C)  ?TempSrc: Oral Oral Oral Oral  ?SpO2: 94% 93% 100% 94%  ?Weight:   101.3 kg   ?Height:      ? ? ?Intake/Output Summary (Last 24 hours) at 01/29/2022 0959 ?Last data filed at 01/29/2022 [id number] ?Gross per 24 hour  ?Intake 740 ml  ?Output 1600 ml  ?Net -860 ml  ? ? ?  01/29/2022  ?  5:48 AM 01/28/2022  ?  2:13 AM 10/09/2021  ?  8:02 AM  ?Last 3 Weights  ?Weight (lbs) 223 lb 6.4 oz 223 lb 1.7 oz 226 lb 12.8 oz  ?Weight (kg) 101.334 kg 101.2 kg 102.876 kg  ?   ? ?Telemetry  ?  ?NSR - Personally Reviewed ? ?ECG  ?  ?N/A ? ?Physical Exam  ? ?GEN: No acute distress.   ?Neck: No JVD ?Cardiac: RRR, no murmurs, rubs, or gallops.  ?Respiratory: Clear to auscultation bilaterally. ?GI: Soft, nontender, non-distended  ?MS: Trace edema R > L. No deformity. ?Neuro:  Nonfocal  ?Psych: Normal affect  ? ?Labs  ?  ?High Sensitivity Troponin:   ?Recent Labs  ?Lab 01/26/22 ?1604 01/26/22 ?[id number]  ?TROPONINIHS 26* 22*  ?   ?Chemistry ?Recent Labs  ?Lab 01/27/22 ?0210 01/28/22 ?0356 01/29/22 ?[id number]  ?NA  138 139 136  ?K 4.1 4.3 3.7  ?CL 105 104 96*  ?CO2 26 27 30   ?GLUCOSE 98 146* 180*  ?BUN 16 22 28*  ?CREATININE 0.86 0.97 1.25*  ?CALCIUM 8.9 9.1 8.8*  ?MG 2.2  --   --   ?GFRNONAA >60 >60 >60  ?ANIONGAP 7 8 10   ?  ?Lipids No results for input(s): CHOL, TRIG, HDL, LABVLDL, LDLCALC, CHOLHDL in the last 168 hours.  ?Hematology ?Recent Labs  ?Lab 01/26/22 ?1048 01/27/22 ?0210  ?WBC 8.5 9.1  ?RBC 4.59 4.70  ?HGB 14.9 15.2  ?HCT 45.3 47.3  ?MCV 98.7 100.6*  ?MCH 32.5 32.3  ?MCHC 32.9 32.1  ?RDW 14.0 14.0  ?PLT 165 164  ? ?Thyroid  ?Recent Labs  ?Lab 01/27/22 ?0210  ?TSH 4.474  ?  ?BNP ?Recent Labs  ?Lab 01/26/22 ?[id number]  ?BNP 56.4  ?  ?DDimer No results for input(s): DDIMER in the last 168 hours.  ? ?Radiology  ?  ?ECHOCARDIOGRAM COMPLETE ? ?Result Date: 01/27/2022 ?   ECHOCARDIOGRAM REPORT   Patient Name:   Blake Griffin Date of Exam: 01/27/2022 Medical Rec #:  [id number]     Height:  71.0 in Accession #:    [id number]    Weight:       226.8 lb Date of Birth:  June 19, 1956     BSA:          2.224 m? Patient Age:    66 years      BP:           140/86 mmHg Patient Gender: M             HR:           77 bpm. Exam Location:  Inpatient Procedure: 2D Echo, Cardiac Doppler and Color Doppler Indications:    CHF  History:        Patient has prior history of Echocardiogram examinations, most                 recent 12/01/2020. COPD, Arrythmias:LBBB; Risk Factors:Current                 Smoker. Near syncope. 25 mm Edwards bioprosthetic valve in the                 aortic position. Procedure date 08/06/18.  Sonographer:    Clayton Lefort RDCS (AE) Referring Phys: [place]  1. Left ventricular ejection fraction, by estimation, is 50 to 55%. The left ventricle has low normal function. Left ventricular endocardial border not optimally defined to evaluate regional wall motion. There is mild concentric left ventricular hypertrophy. Left ventricular diastolic parameters are consistent with Grade I diastolic dysfunction  (impaired relaxation).  2. Right ventricular systolic function is normal. The right ventricular size is normal. Tricuspid regurgitation signal is inadequate for assessing PA pressure.  3. The mitral valve is grossly normal. Trivial mitral valve regurgitation.  4. The aortic valve has been repaired/replaced. Aortic valve regurgitation is not visualized. Echo findings are consistent with normal structure and function of the aortic valve prosthesis. Aortic valve mean gradient measures 8.0 mmHg.  5. Aortic dilatation noted. There is borderline dilatation of the ascending aorta, measuring 37 mm.  6. The inferior vena cava is normal in size with greater than 50% respiratory variability, suggesting right atrial pressure of 3 mmHg. Comparison(s): Compared to prior TTE in 11/2020, there is no significant change. Prior EF 45-50%; current EF ~50%. Prior mean AoV gradient 72mmHg, currently 50mmHg. FINDINGS  Left Ventricle: Left ventricular ejection fraction, by estimation, is 50 to 55%. The left ventricle has low normal function. Left ventricular endocardial border not optimally defined to evaluate regional wall motion. The left ventricular internal cavity  size was normal in size. There is mild concentric left ventricular hypertrophy. Abnormal (paradoxical) septal motion, consistent with left bundle branch block. Left ventricular diastolic parameters are consistent with Grade I diastolic dysfunction (impaired relaxation). Right Ventricle: The right ventricular size is normal. Right vetricular wall thickness was not well visualized. Right ventricular systolic function is normal. Tricuspid regurgitation signal is inadequate for assessing PA pressure. Left Atrium: Left atrial size was not well visualized. Right Atrium: Right atrial size was not well visualized. Pericardium: There is no evidence of pericardial effusion. Mitral Valve: The mitral valve is grossly normal. There is mild thickening of the mitral valve leaflet(s). There  is mild calcification of the mitral valve leaflet(s). Mild to moderate mitral annular calcification. Trivial mitral valve regurgitation. MV peak gradient, 5.9 mmHg. The mean mitral valve gradient is 2.0 mmHg. Tricuspid Valve: The tricuspid valve is not well visualized. Tricuspid valve regurgitation is trivial. Aortic Valve: The aortic valve has  been repaired/replaced. Aortic valve regurgitation is not visualized. Aortic valve mean gradient measures 8.0 mmHg. Aortic valve peak gradient measures 13.1 mmHg. Aortic valve area, by VTI measures 2.16 cm?Marland Kitchen There is a 25 mm Edwards bioprosthetic valve present in the aortic position. Echo findings are consistent with normal structure and function of the aortic valve prosthesis. Pulmonic Valve: The pulmonic valve was not well visualized. Pulmonic valve regurgitation is trivial. Aorta: The aortic root is normal in size and structure and aortic dilatation noted. There is borderline dilatation of the ascending aorta, measuring 37 mm. Venous: The inferior vena cava is normal in size with greater than 50% respiratory variability, suggesting right atrial pressure of 3 mmHg. IAS/Shunts: The atrial septum is grossly normal.  LEFT VENTRICLE PLAX 2D LVIDd:         4.60 cm LVIDs:         3.40 cm LV PW:         1.20 cm LV IVS:        1.60 cm LVOT diam:     2.45 cm LV SV:         83 LV SV Index:   37 LVOT Area:     4.71 cm?  RIGHT VENTRICLE             IVC RV Basal diam:  3.50 cm     IVC diam: 1.60 cm RV S prime:     10.90 cm/s TAPSE (M-mode): 1.5 cm LEFT ATRIUM             Index        RIGHT ATRIUM           Index LA diam:        3.70 cm 1.66 cm/m?   RA Area:     19.80 cm? LA Vol (A2C):   62.3 ml 28.01 ml/m?  RA Volume:   58.10 ml  26.12 ml/m? LA Vol (A4C):   37.8 ml 16.99 ml/m? LA Biplane Vol: 51.0 ml 22.93 ml/m?  AORTIC VALVE AV Area (Vmax):    2.97 cm? AV Area (Vmean):   2.40 cm? AV Area (VTI):     2.16 cm? AV Vmax:           181.00 cm/s AV Vmean:          129.000 cm/s AV VTI:             0.382 m AV Peak Grad:      13.1 mmHg AV Mean Grad:      8.0 mmHg LVOT Vmax:         114.00 cm/s LVOT Vmean:        65.800 cm/s LVOT VTI:          0.175 m LVOT/AV VTI ratio: 0.46  AORTA Ao Root diam:

## 2022-01-29 NOTE — TOC Transition Note (Signed)
Transition of Care (TOC) - CM/SW Discharge Note ?Donn Pierini Charity fundraiser, BSN ?Transitions of Care ?Unit 4E- RN Case Manager ?See Treatment Team for direct phone #  ? ? ?Patient Details  ?Name: Blake Griffin ?MRN: [id number] ?Date of Birth: 03-16-1956 ? ?Transition of Care (TOC) CM/SW Contact:  ?Zenda Alpers, Lenn Sink, RN ?Phone Number: ?01/29/2022, 2:25 PM ? ? ?Clinical Narrative:    ?Pt stable for transition home today w/ wife. No TOC needs noted. Pt has transportation home.  ? ? ?Final next level of care: Home/Self Care ?Barriers to Discharge: No Barriers Identified ? ? ?Patient Goals and CMS Choice ?  ?  ?Choice offered to / list presented to : NA ? ?Discharge Placement ?  ?           ?  ? Home ?  ?  ? ?Discharge Plan and Services ?In-house Referral: NA ?Discharge Planning Services: NA ?           ?DME Arranged: N/A ?DME Agency: NA ?  ?  ?  ?HH Arranged: NA ?HH Agency: NA ?  ?  ?  ? ?Social Determinants of Health (SDOH) Interventions ?  ? ? ?Readmission Risk Interventions ? ?  01/29/2022  ?  2:25 PM  ?Readmission Risk Prevention Plan  ?Post Dischage Appt Complete  ?Medication Screening Complete  ?Transportation Screening Complete  ? ? ? ? ? ?

## 2022-01-29 NOTE — Progress Notes (Unsigned)
Enrolled for [employer] to mail a ZIO XT long term holter monitor to the patients address on file.  ? ?Dr. Geraldo Pitter to read and follow up. ?

## 2022-01-30 ENCOUNTER — Telehealth: Payer: Self-pay

## 2022-01-30 MED ORDER — RANOLAZINE ER 1000 MG PO TB12: 1000.0000 mg | ORAL_TABLET | Freq: Two times a day (BID) | ORAL | 3 refills | Status: DC

## 2022-01-30 NOTE — Telephone Encounter (Signed)
Refill sent for Ranexa   

## 2022-02-02 DIAGNOSIS — R55 Syncope and collapse: Secondary | ICD-10-CM

## 2022-02-02 DIAGNOSIS — I4892 Unspecified atrial flutter: Secondary | ICD-10-CM

## 2022-02-02 DIAGNOSIS — R42 Dizziness and giddiness: Secondary | ICD-10-CM

## 2022-02-05 ENCOUNTER — Other Ambulatory Visit: Payer: Self-pay | Admitting: Cardiology

## 2022-02-07 ENCOUNTER — Encounter: Payer: Self-pay | Admitting: Cardiology

## 2022-02-07 MED ORDER — FUROSEMIDE 20 MG PO TABS: 20.0000 mg | ORAL_TABLET | Freq: Every day | ORAL | 1 refills | Status: DC

## 2022-02-12 ENCOUNTER — Encounter: Payer: Self-pay | Admitting: Cardiology

## 2022-02-12 DIAGNOSIS — Z6841 Body Mass Index (BMI) 40.0 and over, adult: Secondary | ICD-10-CM | POA: Diagnosis not present

## 2022-02-12 DIAGNOSIS — I498 Other specified cardiac arrhythmias: Secondary | ICD-10-CM | POA: Diagnosis not present

## 2022-02-12 DIAGNOSIS — Z09 Encounter for follow-up examination after completed treatment for conditions other than malignant neoplasm: Secondary | ICD-10-CM | POA: Diagnosis not present

## 2022-02-12 DIAGNOSIS — I509 Heart failure, unspecified: Secondary | ICD-10-CM | POA: Diagnosis not present

## 2022-02-12 DIAGNOSIS — Z79899 Other long term (current) drug therapy: Secondary | ICD-10-CM | POA: Diagnosis not present

## 2022-02-12 DIAGNOSIS — J449 Chronic obstructive pulmonary disease, unspecified: Secondary | ICD-10-CM | POA: Diagnosis not present

## 2022-02-12 DIAGNOSIS — I350 Nonrheumatic aortic (valve) stenosis: Secondary | ICD-10-CM | POA: Diagnosis not present

## 2022-02-12 DIAGNOSIS — I209 Angina pectoris, unspecified: Secondary | ICD-10-CM | POA: Diagnosis not present

## 2022-02-25 ENCOUNTER — Encounter: Payer: Self-pay | Admitting: Cardiology

## 2022-02-25 ENCOUNTER — Telehealth: Payer: Self-pay

## 2022-02-25 DIAGNOSIS — I4892 Unspecified atrial flutter: Secondary | ICD-10-CM | POA: Diagnosis not present

## 2022-02-25 DIAGNOSIS — R55 Syncope and collapse: Secondary | ICD-10-CM | POA: Diagnosis not present

## 2022-02-25 DIAGNOSIS — R42 Dizziness and giddiness: Secondary | ICD-10-CM | POA: Diagnosis not present

## 2022-02-25 NOTE — Telephone Encounter (Signed)
-----   Message from Jenean Lindau, MD sent at 02/25/2022  3:23 PM EDT ----- ?Regarding: FW: ABNORMAL LONG TERM HOLTER MONITOR RESULTS ?As discussed I am off this afternoon but surprisingly saw this message in my box.  Please call the patient and have him call to the emergency room at [facility].  He needs somebody to drive him.  He cannot drive himself.  If he prefers he could also be taken by his family to the nearest emergency room.  Please let them tell the hospital that he has very slow heart rates. ?----- Message ----- ?From: Markus Daft A ?Sent: 02/25/2022  12:49 PM EDT ?To: Jenean Lindau, MD ?Subject: ABNORMAL LONG TERM HOLTER MONITOR RESULTS     ? ?Abnormal long term holter monitor results available for your review.  5 pauses, longest pause 5.4 seconds. ? ?Please go to " My [employer] Studies,  Assigned to me,  Reading Work List ",  to review patients monitor and sign the study. ?  ?Thank you, ? Shelly  ? ? ?

## 2022-02-25 NOTE — Telephone Encounter (Signed)
Spoke with Blake Griffin per Dr. Tomie China, instructed him to go to the ER at [facility] for evaluation of abnormal heart monitor and long pauses seen on his test. Blake Griffin verbalized understanding and stated that he would go to the ER and would have someone to driver him.  ?

## 2022-02-25 NOTE — Telephone Encounter (Signed)
As per Dr. Kem Parkinson note, Spoke with pt. Advised pt to go to the ER for evaluation of slow heart rate and abnormal heart monitor results ?

## 2022-02-26 ENCOUNTER — Encounter (HOSPITAL_COMMUNITY): Payer: Self-pay | Admitting: Emergency Medicine

## 2022-02-26 ENCOUNTER — Other Ambulatory Visit: Payer: Self-pay

## 2022-02-26 ENCOUNTER — Emergency Department (HOSPITAL_COMMUNITY): Payer: Medicare HMO

## 2022-02-26 ENCOUNTER — Emergency Department (HOSPITAL_COMMUNITY)
Admission: EM | Admit: 2022-02-26 | Discharge: 2022-02-26 | Disposition: A | Discharge status: Home / Self Care | Payer: Medicare HMO | Attending: Emergency Medicine | Admitting: Emergency Medicine

## 2022-02-26 DIAGNOSIS — R55 Syncope and collapse: Secondary | ICD-10-CM | POA: Insufficient documentation

## 2022-02-26 DIAGNOSIS — Z7982 Long term (current) use of aspirin: Secondary | ICD-10-CM | POA: Insufficient documentation

## 2022-02-26 DIAGNOSIS — R009 Unspecified abnormalities of heart beat: Secondary | ICD-10-CM | POA: Insufficient documentation

## 2022-02-26 DIAGNOSIS — I495 Sick sinus syndrome: Secondary | ICD-10-CM | POA: Diagnosis not present

## 2022-02-26 DIAGNOSIS — I455 Other specified heart block: Secondary | ICD-10-CM

## 2022-02-26 DIAGNOSIS — Z79899 Other long term (current) drug therapy: Secondary | ICD-10-CM | POA: Diagnosis not present

## 2022-02-26 DIAGNOSIS — J849 Interstitial pulmonary disease, unspecified: Secondary | ICD-10-CM | POA: Diagnosis not present

## 2022-02-26 DIAGNOSIS — I700 Atherosclerosis of aorta: Secondary | ICD-10-CM | POA: Diagnosis not present

## 2022-02-26 DIAGNOSIS — Z951 Presence of aortocoronary bypass graft: Secondary | ICD-10-CM | POA: Diagnosis not present

## 2022-02-26 DIAGNOSIS — I471 Supraventricular tachycardia: Secondary | ICD-10-CM | POA: Diagnosis not present

## 2022-02-26 DIAGNOSIS — R918 Other nonspecific abnormal finding of lung field: Secondary | ICD-10-CM | POA: Diagnosis not present

## 2022-02-26 LAB — CBC
HCT: 44.4 % (ref 39.0–52.0)
Hemoglobin: 14.7 g/dL (ref 13.0–17.0)
MCH: 32.6 pg (ref 26.0–34.0)
MCHC: 33.1 g/dL (ref 30.0–36.0)
MCV: 98.4 fL (ref 80.0–100.0)
Platelets: 164 10*3/uL (ref 150–400)
RBC: 4.51 MIL/uL (ref 4.22–5.81)
RDW: 13.4 % (ref 11.5–15.5)
WBC: 7.5 10*3/uL (ref 4.0–10.5)
nRBC: 0 % (ref 0.0–0.2)

## 2022-02-26 LAB — BASIC METABOLIC PANEL
Anion gap: 6 (ref 5–15)
BUN: 19 mg/dL (ref 8–23)
CO2: 24 mmol/L (ref 22–32)
Calcium: 9 mg/dL (ref 8.9–10.3)
Chloride: 109 mmol/L (ref 98–111)
Creatinine, Ser: 0.91 mg/dL (ref 0.61–1.24)
GFR, Estimated: >60 mL/min (ref >60)
Glucose, Bld: 164 mg/dL — ABNORMAL HIGH (ref 70–99)
Potassium: 4 mmol/L (ref 3.5–5.1)
Sodium: 139 mmol/L (ref 135–145)

## 2022-02-26 LAB — TROPONIN I (HIGH SENSITIVITY)
Troponin I (High Sensitivity): 14 ng/L (ref <18)
Troponin I (High Sensitivity): 15 ng/L (ref <18)

## 2022-02-26 MED ORDER — METOPROLOL SUCCINATE ER 25 MG PO TB24: 25.0000 mg | ORAL_TABLET | Freq: Every day | ORAL | 6 refills | Status: DC

## 2022-02-26 MED ORDER — METOPROLOL SUCCINATE ER 25 MG PO TB24: 25.0000 mg | ORAL_TABLET | Freq: Every day | ORAL | Status: DC

## 2022-02-26 NOTE — ED Provider Notes (Signed)
?[place] ?Provider Note ? ? ?CSN: [id number] ?Arrival date & time: 02/26/22  [id number] ? ?  ? ?History ? ?Chief Complaint  ?Patient presents with  ? Irregular Heart Beat  ? ? ?Blake Griffin is a 66 y.o. male. ? ?Patient with hx cad, c/o being told by his cardiologist office to come to ED. Patient with recent hospitalization last month for near syncope, had outpatient ZIO monitor for past two weeks which did show several 5 second pauses, as well as episodes svt. Pt denies any recent episodes of faintness/lightheadedness other than immediately after being placed into ED room (however not on monitor at that time). Patient denies any current or recent chest pain or discomfort. No sob. No palpitations. No syncope. No fever or chills. Other than at recent hospital d/c, denies any other recent change in meds.  ? ?The history is provided by the patient and medical records.  ? ?  ? ?Home Medications ?Prior to Admission medications   ?Medication Sig Start Date End Date Taking? Authorizing Provider  ?albuterol (PROVENTIL HFA;VENTOLIN HFA) 108 (90 Base) MCG/ACT inhaler Inhale 2 puffs into the lungs every 6 (six) hours as needed for wheezing or shortness of breath.     [provider]  ?aspirin EC 81 MG tablet Take 1 tablet (81 mg total) by mouth daily. 02/12/21   Revankar, Reita Cliche, MD  ?atorvastatin (LIPITOR) 40 MG tablet Take 20 mg by mouth daily.    [provider]  ?D3-50 1.25 MG (50000 UT) capsule Take 50,000 Units by mouth once a week. No set day 03/03/19   [provider]  ?furosemide (LASIX) 20 MG tablet Take 1 tablet (20 mg total) by mouth daily. 02/07/22 02/07/23  Revankar, Reita Cliche, MD  ?lisinopril (ZESTRIL) 20 MG tablet Take 1 tablet (20 mg total) by mouth daily. 01/29/22 01/29/23  Caren Griffins, MD  ?metoprolol tartrate (LOPRESSOR) 25 MG tablet Take 1 tablet (25 mg total) by mouth 2 (two) times daily. 02/05/22   Revankar, Reita Cliche, MD  ?nicotine (NICODERM CQ -  DOSED IN MG/24 HOURS) 21 mg/24hr patch Place 1 patch (21 mg total) onto the skin daily. 01/30/22   Caren Griffins, MD  ?nitroGLYCERIN (NITROSTAT) 0.4 MG SL tablet Place 0.4 mg under the tongue every 5 (five) minutes as needed for chest pain.    [provider]  ?ranolazine (RANEXA) 1000 MG SR tablet Take 1 tablet (1,000 mg total) by mouth 2 (two) times daily. 01/30/22   Revankar, Reita Cliche, MD  ?valACYclovir (VALTREX) 500 MG tablet Take 500 mg by mouth daily as needed for other (cold sores). Cold sores    [provider]  ?   ? ?Allergies    ?Patient has no known allergies.   ? ?Review of Systems   ?Review of Systems  ?Constitutional:  Negative for fever.  ?HENT:  Negative for sore throat.   ?Eyes:  Negative for redness.  ?Respiratory:  Negative for shortness of breath.   ?Cardiovascular:  Negative for chest pain, palpitations and leg swelling.  ?Gastrointestinal:  Negative for abdominal pain, blood in stool and vomiting.  ?Genitourinary:  Negative for flank pain.  ?Musculoskeletal:  Negative for back pain and neck pain.  ?Skin:  Negative for rash.  ?Neurological:  Positive for light-headedness. Negative for headaches.  ?Hematological:  Does not bruise/bleed easily.  ?Psychiatric/Behavioral:  Negative for confusion.   ? ?Physical Exam ?Updated Vital Signs ?BP (!) 166/77   Pulse 67  Temp 98.4 ?F (36.9 ?C) (Oral)   Resp 20   SpO2 91%  ?Physical Exam ?Vitals and nursing note reviewed.  ?Constitutional:   ?   Appearance: Normal appearance. He is well-developed.  ?HENT:  ?   Head: Atraumatic.  ?   Nose: Nose normal.  ?   Mouth/Throat:  ?   Mouth: Mucous membranes are moist.  ?Eyes:  ?   General: No scleral icterus. ?   Conjunctiva/sclera: Conjunctivae normal.  ?Neck:  ?   Vascular: No carotid bruit.  ?   Trachea: No tracheal deviation.  ?   Comments: Thyroid not grossly enlarged or tender.  ?Cardiovascular:  ?   Rate and Rhythm: Normal rate and regular rhythm.  ?   Pulses: Normal pulses.  ?   Heart  sounds: Normal heart sounds. No murmur heard. ?  No friction rub. No gallop.  ?Pulmonary:  ?   Effort: Pulmonary effort is normal. No accessory muscle usage or respiratory distress.  ?   Breath sounds: Normal breath sounds.  ?Abdominal:  ?   General: Bowel sounds are normal. There is no distension.  ?   Palpations: Abdomen is soft. There is no mass.  ?   Tenderness: There is no abdominal tenderness.  ?Genitourinary: ?   Comments: No cva tenderness. ?Musculoskeletal:     ?   General: No swelling or tenderness.  ?   Cervical back: Normal range of motion and neck supple. No rigidity.  ?   Right lower leg: No edema.  ?   Left lower leg: No edema.  ?Skin: ?   General: Skin is warm and dry.  ?   Findings: No rash.  ?Neurological:  ?   Mental Status: He is alert.  ?   Comments: Alert, speech clear. Steady gait.   ?Psychiatric:     ?   Mood and Affect: Mood normal.  ? ? ?ED Results / Procedures / Treatments   ?Labs ?(all labs ordered are listed, but only abnormal results are displayed) ?Results for orders placed or performed during the hospital encounter of 02/26/22  ?Basic metabolic panel  ?Result Value Ref Range  ? Sodium 139 135 - 145 mmol/L  ? Potassium 4.0 3.5 - 5.1 mmol/L  ? Chloride 109 98 - 111 mmol/L  ? CO2 24 22 - 32 mmol/L  ? Glucose, Bld 164 (H) 70 - 99 mg/dL  ? BUN 19 8 - 23 mg/dL  ? Creatinine, Ser 0.91 0.61 - 1.24 mg/dL  ? Calcium 9.0 8.9 - 10.3 mg/dL  ? GFR, Estimated >60 >60 mL/min  ? Anion gap 6 5 - 15  ?CBC  ?Result Value Ref Range  ? WBC 7.5 4.0 - 10.5 K/uL  ? RBC 4.51 4.22 - 5.81 MIL/uL  ? Hemoglobin 14.7 13.0 - 17.0 g/dL  ? HCT 44.4 39.0 - 52.0 %  ? MCV 98.4 80.0 - 100.0 fL  ? MCH 32.6 26.0 - 34.0 pg  ? MCHC 33.1 30.0 - 36.0 g/dL  ? RDW 13.4 11.5 - 15.5 %  ? Platelets 164 150 - 400 K/uL  ? nRBC 0.0 0.0 - 0.2 %  ?Troponin I (High Sensitivity)  ?Result Value Ref Range  ? Troponin I (High Sensitivity) 14 <18 ng/L  ?Troponin I (High Sensitivity)  ?Result Value Ref Range  ? Troponin I (High Sensitivity) 15  <18 ng/L  ? ?DG Chest 2 View ? ?Result Date: 02/26/2022 ?CLINICAL DATA:  66 year old male with history of irregular heart beat. EXAM: CHEST - 2  VIEW COMPARISON:  Chest x-ray 01/26/2022. FINDINGS: Lung volumes are normal. Diffuse peribronchial cuffing and mild diffuse interstitial prominence. No consolidative airspace disease. No pleural effusions. No pneumothorax. No pulmonary nodule or mass noted. Pulmonary vasculature and the cardiomediastinal silhouette are within normal limits. Atherosclerosis in the thoracic aorta. Status post median sternotomy for CABG and aortic valve replacement. IMPRESSION: 1. Mild diffuse interstitial prominence and peribronchial cuffing, concerning for an acute bronchitis. 2. Aortic atherosclerosis. Electronically Signed   By: Vinnie Langton M.D.   On: 02/26/2022 07:30  ? ?ECHOCARDIOGRAM COMPLETE ? ?Result Date: 01/27/2022 ?   ECHOCARDIOGRAM REPORT   Patient Name:   Blake Griffin Date of Exam: 01/27/2022 Medical Rec #:  [id number]     Height:       71.0 in Accession #:    [id number]    Weight:       226.8 lb Date of Birth:  1955/12/19     BSA:          2.224 m? Patient Age:    61 years      BP:           140/86 mmHg Patient Gender: M             HR:           77 bpm. Exam Location:  Inpatient Procedure: 2D Echo, Cardiac Doppler and Color Doppler Indications:    CHF  History:        Patient has prior history of Echocardiogram examinations, most                 recent 12/01/2020. COPD, Arrythmias:LBBB; Risk Factors:Current                 Smoker. Near syncope. 25 mm Edwards bioprosthetic valve in the                 aortic position. Procedure date 08/06/18.  Sonographer:    Clayton Lefort RDCS (AE) Referring Phys: [place]  1. Left ventricular ejection fraction, by estimation, is 50 to 55%. The left ventricle has low normal function. Left ventricular endocardial border not optimally defined to evaluate regional wall motion. There is mild concentric left ventricular  hypertrophy. Left ventricular diastolic parameters are consistent with Grade I diastolic dysfunction (impaired relaxation).  2. Right ventricular systolic function is normal. The right ventricular size is normal

## 2022-02-26 NOTE — ED Triage Notes (Signed)
Pt reported to ED after being referred further evaluation by PCP after wearing Holter monitor and being told he has "5 second pauses". Pt denies any chest pain or new onset shortness of breath, states he has occasional episodes d/t smoking hx. States he felt fine, and has been participating in normal activities with no difficulty.  ?

## 2022-02-26 NOTE — ED Notes (Signed)
Pt ambulatory to waiting room. Pt verbalized understanding of discharge instructions.   

## 2022-02-26 NOTE — Consult Note (Addendum)
? ?ELECTROPHYSIOLOGY CONSULT NOTE  ? ? ?Patient ID: Blake Griffin ?MRN: [id number], DOB/AGE: 02-24-1956 66 y.o. ? ?Admit date: 02/26/2022 ?Date of Consult: 02/26/2022 ? ?Primary Physician: Helen Hashimoto., MD ?Primary Cardiologist: Jenean Lindau, MD  ?Electrophysiologist:  New ? ?Referring Provider: Dr. Geraldo Pitter (sent to ED) ? ?Patient Profile: ?Blake Griffin is a 66 y.o. male with a history of probably SVT, near syncope, CAD with stable angina, HTN, and tobacco abuse (50+ pack years) who is being seen today for the evaluation of pauses on monitoring at the request of Dr. Geraldo Pitter who sent pt to ED based on monitor results. . ? ?HPI:  ?Blake Griffin is a 66 y.o. male with medical history as above.  ? ?Pt previously admitted 4/15 - 4/18 with near syncope. Echo was unremarkable with normal EF. Had brief episodes of SVT vs flutter on monitor which were at times associated with diaphoresis and near syncope.  Discharged on metoprolol 25 mg BID and with Zio in place.  ? ?Pt wore monitor to completion which showed: ?HR min 35 bpm ?HR  max 164 bpm ?HR avg 66 bpm ?11 SVT fastest 9 beats / 164 bpm , longest 13.5 seconds at 129 bpm with some noise.  ?5 pauses, longest 5.4 seconds again with some surrounding noise.  ?Rare SVEs/VEs.  ? ?Pt was instructed to go to ED when it resulted.  ? ?The patient is currently seated upright in bed with NAD. He mowed the yard yesterday and felt great.  He has overall felt better on increased beta block.  He had two episodes of dizziness while wearing the monitor, one the day after it was put on. He states he was driving and also angry.  He has not had any frank syncope. He was surprised and scared when he was instructed to report to the ED.  He is working on smoking cessation. Drinks 2-3 oz liquor nightly.  ? ?Past Medical History:  ?Diagnosis Date  ? Abnormal nuclear cardiac imaging test   ? Anal fissure   ? Aortic stenosis   ? Benign essential HTN 08/28/2017  ? Bilateral carotid bruits    ? COPD (chronic obstructive pulmonary disease) ([place])   ? Coronary artery disease involving native coronary artery of native heart with angina pectoris ([place])   ? Dyslipidemia   ? ED (erectile dysfunction)   ? Erectile dysfunction 06/29/2018  ? Hemorrhoids   ? Hypertension   ? Hypogonadism in male   ? Left bundle branch block (LBBB) 08/06/2018  ? Leg cramps   ? Light headed 08/28/2017  ? Pain of left shoulder region   ? Petechiae   ? Pneumonia 2016  ? S/P aortic valve replacement with bioprosthetic valve 08/06/2018  ? 25 mm Edwards Intuity Elite stented bovine pericardial bioprosthetic tissue valve  [id number] Model: 8300AB  ? S/P AVR (aortic valve replacement)  08/06/2018  ? 25 mm Edwards Intuity Elite stented bovine pericardial bioprosthetic tissue valve  [id number] Model: 8300AB  ? S/P CABG x 1 08/06/2018  ? SVG to RCA, EVH via right thigh  ? Smoking   ? Vitamin D deficiency   ?  ? ?Surgical History:  ?Past Surgical History:  ?Procedure Laterality Date  ? AORTIC VALVE REPLACEMENT N/A 08/06/2018  ? Procedure: AORTIC VALVE REPLACEMENT (AVR);  Surgeon: Rexene Alberts, MD;  Location: [facility];  Service: Open Heart Surgery;  Laterality: N/A;  ? APPENDECTOMY    ? COLONOSCOPY W/ POLYPECTOMY    ? x 2  ?  CORONARY ARTERY BYPASS GRAFT N/A 08/06/2018  ? Procedure: CORONARY ARTERY BYPASS GRAFTING (CABG) times one using right greater saphenous vein harvested endoscopically and aortic valve replacement.;  Surgeon: Rexene Alberts, MD;  Location: [facility];  Service: Open Heart Surgery;  Laterality: N/A;  ? CORONARY/GRAFT ANGIOGRAPHY N/A 11/21/2020  ? Procedure: CORONARY/GRAFT ANGIOGRAPHY;  Surgeon: Troy Sine, MD;  Location: [facility];  Service: Cardiovascular;  Laterality: N/A;  ? Lung Biospy Right   ? lower lung  ? RIGHT/LEFT HEART CATH AND CORONARY ANGIOGRAPHY N/A 06/30/2018  ? Procedure: RIGHT/LEFT HEART CATH AND CORONARY ANGIOGRAPHY;  Surgeon: Belva Crome, MD;  Location: [facility];  Service:  Cardiovascular;  Laterality: N/A;  ? TEE WITHOUT CARDIOVERSION N/A 08/06/2018  ? Procedure: TRANSESOPHAGEAL ECHOCARDIOGRAM (TEE);  Surgeon: Rexene Alberts, MD;  Location: [facility];  Service: Open Heart Surgery;  Laterality: N/A;  ? TOOTH EXTRACTION N/A 07/30/2018  ? Procedure: Extraction of tooth #5 w/ Alveoloplasty and Gross debridement of remaining dentition.;  Surgeon: Lenn Cal, DDS;  Location: [facility];  Service: Oral Surgery;  Laterality: N/A;  ?  ? ?(Not in a hospital admission) ? ? ?Inpatient Medications:  ? ?Allergies: No Known Allergies ? ?Social History  ? ?Socioeconomic History  ? Marital status: Married  ?  Spouse name: Not on file  ? Number of children: 5  ? Years of education: Not on file  ? Highest education level: Not on file  ?Occupational History  ? Not on file  ?Tobacco Use  ? Smoking status: Former  ?  Packs/day: 1.00  ?  Years: 50.00  ?  Pack years: 50.00  ?  Types: Cigarettes  ?  Quit date: 08/06/2018  ?  Years since quitting: 3.5  ? Smokeless tobacco: Never  ?Vaping Use  ? Vaping Use: Never used  ?Substance and Sexual Activity  ? Alcohol use: Yes  ?  Alcohol/week: 14.0 standard drinks  ?  Types: 14 Shots of liquor per week  ?  Comment: Bacardi rum daily  ? Drug use: Never  ? Sexual activity: Not on file  ?Other Topics Concern  ? Not on file  ?Social History Narrative  ? Not on file  ? ?Social Determinants of Health  ? ?Financial Resource Strain: Not on file  ?Food Insecurity: Not on file  ?Transportation Needs: Not on file  ?Physical Activity: Not on file  ?Stress: Not on file  ?Social Connections: Not on file  ?Intimate Partner Violence: Not on file  ?  ? ?Family History  ?Problem Relation Age of Onset  ? Hypertension Mother   ? Coronary artery disease Mother   ? Hypertension Father   ?  ? ?Review of Systems: ?All other systems reviewed and are otherwise negative except as noted above. ? ?Physical Exam: ?Vitals:  ? 02/26/22 0700  ?BP: (!) 166/77  ?Pulse: 67  ?Resp: 20  ?Temp: 98.4 ?F  (36.9 ?C)  ?TempSrc: Oral  ?SpO2: 91%  ? ? ?GEN- The patient is well appearing, alert and oriented x 3 today.   ?HEENT: normocephalic, atraumatic; sclera clear, conjunctiva pink; hearing intact; oropharynx clear; neck supple ?Lungs- Clear to ausculation bilaterally, normal work of breathing.  No wheezes, rales, rhonchi ?Heart- Regular rate and rhythm, no murmurs, rubs or gallops ?GI- soft, non-tender, non-distended, bowel sounds present ?Extremities- no clubbing, cyanosis, or edema; DP/PT/radial pulses 2+ bilaterally ?MS- no significant deformity or atrophy ?Skin- warm and dry, no rash or lesion ?Psych- euthymic mood, full affect ?Neuro-  strength and sensation are intact ? ?Labs: ?  ?Lab Results  ?Component Value Date  ? WBC 7.5 02/26/2022  ? HGB 14.7 02/26/2022  ? HCT 44.4 02/26/2022  ? MCV 98.4 02/26/2022  ? PLT 164 02/26/2022  ?  ?Recent Labs  ?Lab 02/26/22 ?[id number]  ?NA 139  ?K 4.0  ?CL 109  ?CO2 24  ?BUN 19  ?CREATININE 0.91  ?CALCIUM 9.0  ?GLUCOSE 164*  ? ? ?  ?Radiology/Studies: DG Chest 2 View ? ?Result Date: 02/26/2022 ?CLINICAL DATA:  66 year old male with history of irregular heart beat. EXAM: CHEST - 2 VIEW COMPARISON:  Chest x-ray 01/26/2022. FINDINGS: Lung volumes are normal. Diffuse peribronchial cuffing and mild diffuse interstitial prominence. No consolidative airspace disease. No pleural effusions. No pneumothorax. No pulmonary nodule or mass noted. Pulmonary vasculature and the cardiomediastinal silhouette are within normal limits. Atherosclerosis in the thoracic aorta. Status post median sternotomy for CABG and aortic valve replacement. IMPRESSION: 1. Mild diffuse interstitial prominence and peribronchial cuffing, concerning for an acute bronchitis. 2. Aortic atherosclerosis. Electronically Signed   By: Vinnie Langton M.D.   On: 02/26/2022 07:30  ? ?ECHOCARDIOGRAM COMPLETE ? ?Result Date: 01/27/2022 ?   ECHOCARDIOGRAM REPORT   Patient Name:   Blake Griffin [place] Date of Exam: 01/27/2022 Medical Rec #:   [id number]     Height:       71.0 in Accession #:    [id number]    Weight:       226.8 lb Date of Birth:  1956-07-16     BSA:          2.224 m? Patient Age:    70 years      BP:           140/86 mmHg Patient Gender: M

## 2022-02-26 NOTE — Discharge Instructions (Addendum)
It was our pleasure to provide your ER care today - we hope that you feel better. ? ?The cardiologist indicates to stop taking the lopressor (the beta blocker that you have been taking 2x/day), and instead take toprol xl once per day. ? ?Follow up with your cardiologist in office in the next 1-2 weeks.  ? ?Return to ER if worse, new symptoms, fevers, chest pain, trouble breathing, fainting, or other concern.  ?

## 2022-03-07 ENCOUNTER — Other Ambulatory Visit: Payer: Self-pay | Admitting: Cardiology

## 2022-03-19 ENCOUNTER — Encounter: Payer: Self-pay | Admitting: Cardiology

## 2022-03-19 ENCOUNTER — Ambulatory Visit (INDEPENDENT_AMBULATORY_CARE_PROVIDER_SITE_OTHER): Payer: Medicare HMO | Admitting: Cardiology

## 2022-03-19 VITALS — BP 170/90 | HR 64 | Ht 71.0 in | Wt 235.0 lb

## 2022-03-19 DIAGNOSIS — I25119 Atherosclerotic heart disease of native coronary artery with unspecified angina pectoris: Secondary | ICD-10-CM | POA: Diagnosis not present

## 2022-03-19 DIAGNOSIS — I10 Essential (primary) hypertension: Secondary | ICD-10-CM

## 2022-03-19 DIAGNOSIS — R55 Syncope and collapse: Secondary | ICD-10-CM | POA: Diagnosis not present

## 2022-03-19 DIAGNOSIS — Z953 Presence of xenogenic heart valve: Secondary | ICD-10-CM | POA: Diagnosis not present

## 2022-03-19 DIAGNOSIS — E785 Hyperlipidemia, unspecified: Secondary | ICD-10-CM | POA: Diagnosis not present

## 2022-03-19 DIAGNOSIS — Z952 Presence of prosthetic heart valve: Secondary | ICD-10-CM

## 2022-03-19 DIAGNOSIS — Z951 Presence of aortocoronary bypass graft: Secondary | ICD-10-CM | POA: Diagnosis not present

## 2022-03-19 NOTE — Patient Instructions (Signed)

## 2022-03-19 NOTE — Progress Notes (Signed)
Cardiology Office Note:    Date:  03/19/2022   ID:  Blake MuscaVernon R Diel, DOB 08/07/1956, MRN [id number]  PCP:  Wilmer Floorampbell, Stephen D., MD  Cardiologist:  Garwin Brothersajan R Ronnel Zuercher, MD   Referring MD: Wilmer Floorampbell, Stephen D., MD    ASSESSMENT:    1. Near syncope   2. Primary hypertension   3. Coronary artery disease involving native coronary artery of native heart with angina pectoris (HCC)   4. S/P CABG x 1   5. S/P AVR (aortic valve replacement)    6. S/P aortic valve replacement with bioprosthetic valve   7. Dyslipidemia    PLAN:    In order of problems listed above:  Coronary artery disease: Secondary prevention stressed with the patient.  Importance of compliance with diet medication stressed any vocalized understanding. Tachybradycardia syndrome: Patient will need an evaluation for permanent pacing.  He has significant bradycardia arrhythmias and pauses and also needs treatment for tachyarrhythmias.  He also would benefit from beta-blocker because of coronary artery disease.  He understands this and will keep his appointment with Dr. Elberta Fortisamnitz. Essential hypertension: He has an element of whitecoat hypertension and is concerned about his recent episode sent possibility of permanent pacemaker and is stressed about it.  I reassured him about my findings today we will keep a track of his blood pressures. Mixed dyslipidemia: Stable and managed by primary care.  Lab work was reviewed. Obesity: Weight reduction stressed and diet emphasized.  Risks of obesity explained and he promises to do better. Patient will be seen in follow-up appointment in 6 months or earlier if the patient has any concerns    Medication Adjustments/Labs and Tests Ordered: Current medicines are reviewed at length with the patient today.  Concerns regarding medicines are outlined above.  No orders of the defined types were placed in this encounter.  No orders of the defined types were placed in this encounter.    Chief  Complaint  Patient presents with   Follow-up     History of Present Illness:    Blake Griffin is a 66 y.o. male.  Patient has past medical history of coronary artery disease, aortic valve replacement, essential hypertension and sick sinus syndrome with tachybradycardia syndrome.  He had significant pauses and therefore his beta-blocker was reduced.  He was sent to the emergency room.  He was evaluated and discharged home and has an appointment coming up with Dr. Elberta Fortisamnitz in the next few days.  He feels dizzy at times.  He has not passed out since.  He knows not to drive.  At the time of my evaluation, the patient is alert awake oriented and in no distress.  Past Medical History:  Diagnosis Date   Abnormal nuclear cardiac imaging test    Acute on chronic heart failure with mid-range ejection fraction (HFmEF) (HCC) 01/26/2022   Alcohol use 01/26/2022   Anal fissure    Aortic stenosis    Atrial fibrillation with RVR (HCC) 01/27/2022   Benign essential HTN 08/28/2017   Bilateral carotid bruits    COPD (chronic obstructive pulmonary disease) (HCC)    Coronary artery disease involving native coronary artery of native heart with angina pectoris (HCC)    Dyslipidemia    ED (erectile dysfunction)    Erectile dysfunction 06/29/2018   Hemorrhoids    Hypertension    Hypogonadism in male    Left bundle branch block (LBBB) 08/06/2018   Leg cramps    Light headed 08/28/2017   Near syncope 01/26/2022  Pain of left shoulder region    Petechiae    Pneumonia 2016   S/P aortic valve replacement with bioprosthetic valve 08/06/2018   25 mm Edwards Intuity Elite stented bovine pericardial bioprosthetic tissue valve  [id number] Model: 8300AB   S/P AVR (aortic valve replacement)  08/06/2018   25 mm Edwards Intuity Elite stented bovine pericardial bioprosthetic tissue valve  [id number] Model: 8300AB   S/P CABG x 1 08/06/2018   SVG to RCA, EVH via right thigh   Smoking    Tachycardia, unspecified 01/28/2022    Tobacco use 01/26/2022   Vitamin D deficiency     Past Surgical History:  Procedure Laterality Date   AORTIC VALVE REPLACEMENT N/A 08/06/2018   Procedure: AORTIC VALVE REPLACEMENT (AVR);  Surgeon: Purcell Nails, MD;  Location: [facility] OR;  Service: Open Heart Surgery;  Laterality: N/A;   APPENDECTOMY     COLONOSCOPY W/ POLYPECTOMY     x 2   CORONARY ARTERY BYPASS GRAFT N/A 08/06/2018   Procedure: CORONARY ARTERY BYPASS GRAFTING (CABG) times one using right greater saphenous vein harvested endoscopically and aortic valve replacement.;  Surgeon: Purcell Nails, MD;  Location: [facility] OR;  Service: Open Heart Surgery;  Laterality: N/A;   CORONARY/GRAFT ANGIOGRAPHY N/A 11/21/2020   Procedure: CORONARY/GRAFT ANGIOGRAPHY;  Surgeon: Lennette Bihari, MD;  Location: [facility] INVASIVE CV LAB;  Service: Cardiovascular;  Laterality: N/A;   Lung Biospy Right    lower lung   RIGHT/LEFT HEART CATH AND CORONARY ANGIOGRAPHY N/A 06/30/2018   Procedure: RIGHT/LEFT HEART CATH AND CORONARY ANGIOGRAPHY;  Surgeon: Lyn Records, MD;  Location: [facility] INVASIVE CV LAB;  Service: Cardiovascular;  Laterality: N/A;   TEE WITHOUT CARDIOVERSION N/A 08/06/2018   Procedure: TRANSESOPHAGEAL ECHOCARDIOGRAM (TEE);  Surgeon: Purcell Nails, MD;  Location: [facility] OR;  Service: Open Heart Surgery;  Laterality: N/A;   TOOTH EXTRACTION N/A 07/30/2018   Procedure: Extraction of tooth #5 w/ Alveoloplasty and Gross debridement of remaining dentition.;  Surgeon: Charlynne Pander, DDS;  Location: [facility] OR;  Service: Oral Surgery;  Laterality: N/A;    Current Medications: Current Meds  Medication Sig   albuterol (PROVENTIL HFA;VENTOLIN HFA) 108 (90 Base) MCG/ACT inhaler Inhale 2 puffs into the lungs every 6 (six) hours as needed for wheezing or shortness of breath.    aspirin EC 81 MG tablet Take 1 tablet (81 mg total) by mouth daily.   atorvastatin (LIPITOR) 40 MG tablet Take 20 mg by mouth daily.   D3-50 1.25 MG (50000 UT) capsule Take 50,000  Units by mouth once a week. No set day   furosemide (LASIX) 20 MG tablet Take 1 tablet (20 mg total) by mouth daily.   lisinopril (ZESTRIL) 20 MG tablet Take 1 tablet (20 mg total) by mouth daily.   metoprolol succinate (TOPROL-XL) 25 MG 24 hr tablet Take 1 tablet (25 mg total) by mouth daily.   nicotine (NICODERM CQ - DOSED IN MG/24 HOURS) 21 mg/24hr patch Place 1 patch (21 mg total) onto the skin daily.   ranolazine (RANEXA) 1000 MG SR tablet Take 1 tablet (1,000 mg total) by mouth 2 (two) times daily.     Allergies:   Patient has no known allergies.   Social History   Socioeconomic History   Marital status: Married    Spouse name: Not on file   Number of children: 5   Years of education: Not on file   Highest education level: Not on file  Occupational History   Not on file  Tobacco Use   Smoking status: Former    Packs/day: 1.00    Years: 50.00    Pack years: 50.00    Types: Cigarettes    Quit date: 08/06/2018    Years since quitting: 3.6   Smokeless tobacco: Never  Vaping Use   Vaping Use: Never used  Substance and Sexual Activity   Alcohol use: Yes    Alcohol/week: 14.0 standard drinks    Types: 14 Shots of liquor per week    Comment: Bacardi rum daily   Drug use: Never   Sexual activity: Not on file  Other Topics Concern   Not on file  Social History Narrative   Not on file   Social Determinants of Health   Financial Resource Strain: Not on file  Food Insecurity: Not on file  Transportation Needs: Not on file  Physical Activity: Not on file  Stress: Not on file  Social Connections: Not on file     Family History: The patient's family history includes Coronary artery disease in his mother; Hypertension in his father and mother.  ROS:   Please see the history of present illness.    All other systems reviewed and are negative.  EKGs/Labs/Other Studies Reviewed:    The following studies were reviewed today: I discussed event monitor findings with the  patient at length   Recent Labs: 01/26/2022: B Natriuretic Peptide 56.4 01/27/2022: Magnesium 2.2; TSH 4.474 02/26/2022: BUN 19; Creatinine, Ser 0.91; Hemoglobin 14.7; Platelets 164; Potassium 4.0; Sodium 139  Recent Lipid Panel    Component Value Date/Time   CHOL 139 06/29/2018 0938   TRIG 173 (H) 06/29/2018 0938   HDL 38 (L) 06/29/2018 0938   CHOLHDL 3.7 06/29/2018 0938   LDLCALC 66 06/29/2018 0938    Physical Exam:    VS:  BP (!) 170/90 (BP Location: Right Arm, Patient Position: Sitting, Cuff Size: Normal)   Pulse 64   Ht 5\' 11"  (1.803 m)   Wt 235 lb (106.6 kg)   SpO2 93%   BMI 32.78 kg/m     Wt Readings from Last 3 Encounters:  03/19/22 235 lb (106.6 kg)  01/29/22 223 lb 6.4 oz (101.3 kg)  10/09/21 226 lb 12.8 oz (102.9 kg)     GEN: Patient is in no acute distress HEENT: Normal NECK: No JVD; No carotid bruits LYMPHATICS: No lymphadenopathy CARDIAC: Hear sounds regular, 2/6 systolic murmur at the apex. RESPIRATORY:  Clear to auscultation without rales, wheezing or rhonchi  ABDOMEN: Soft, non-tender, non-distended MUSCULOSKELETAL:  No edema; No deformity  SKIN: Warm and dry NEUROLOGIC:  Alert and oriented x 3 PSYCHIATRIC:  Normal affect   Signed, 10/11/21, MD  03/19/2022 8:50 AM    [facility]

## 2022-03-30 ENCOUNTER — Encounter: Payer: Self-pay | Admitting: Cardiology

## 2022-04-01 ENCOUNTER — Other Ambulatory Visit: Payer: Self-pay

## 2022-04-01 MED ORDER — LISINOPRIL 20 MG PO TABS
20.0000 mg | ORAL_TABLET | Freq: Every day | ORAL | 3 refills | Status: DC
Start: 2022-04-01 — End: 2023-02-26

## 2022-04-02 ENCOUNTER — Encounter: Payer: Self-pay | Admitting: Cardiology

## 2022-04-02 ENCOUNTER — Ambulatory Visit (INDEPENDENT_AMBULATORY_CARE_PROVIDER_SITE_OTHER): Payer: Medicare HMO | Admitting: Cardiology

## 2022-04-02 VITALS — BP 124/72 | HR 73 | Ht 71.0 in | Wt 234.4 lb

## 2022-04-02 DIAGNOSIS — I471 Supraventricular tachycardia: Secondary | ICD-10-CM

## 2022-04-02 NOTE — Patient Instructions (Addendum)
Medication Instructions:  Your physician has recommended you make the following change in your medication:  INCREASE Lasix to 40 mg daily for the next 7 days ,then return to normal daily dosing of 20 mg  *If you need a refill on your cardiac medications before your next appointment, please call your pharmacy*   Lab Work: None ordered   Testing/Procedures: None ordered   Follow-Up: At [facility], you and your health needs are our priority.  As part of our continuing mission to provide you with exceptional heart care, we have created designated Provider Care Teams.  These Care Teams include your primary Cardiologist (physician) and Advanced Practice Providers (APPs -  Physician Assistants and Nurse Practitioners) who all work together to provide you with the care you need, when you need it.   Your next appointment:   6 month(s)  The format for your next appointment:   In Person  Provider:   Loman Brooklyn, MD    Thank you for choosing [facility]!!   Dory Horn, RN [phone]  Other Instructions    Important Information About Sugar

## 2022-04-02 NOTE — Progress Notes (Signed)
Electrophysiology Office Note   Date:  04/02/2022   ID:  Darron, Stuck October 30, 1955, MRN [id number]  PCP:  Wilmer Floor., MD  Cardiologist:  Revankar Primary Electrophysiologist:  Winslow Verrill Jorja Loa, MD    Chief Complaint: SVT   History of Present Illness: Blake Griffin is a 66 y.o. male who is being seen today for the evaluation of SVT at the request of Wilmer Floor., MD. Presenting today for electrophysiology evaluation.  He has a history significant for SVT, near syncope, coronary artery disease, hypertension, tobacco abuse.  He was found to have pauses on his cardiac monitor.  He was sent to the emergency room based on monitor results.  He was having episodes of SVT versus atrial flutter on his cardiac monitor.  When he was seen in the hospital, his metoprolol was decreased to Toprol-XL 25 mg.  Since that time he has done well.  He has noted no further pauses or episodes of near syncope.  He does feel dizzy when he stands up or turns from side to side, but has not had any further episodes similar to his episodes prior to admission.  Today, he denies symptoms of palpitations, chest pain, shortness of breath, orthopnea, PND, lower extremity edema, claudication, dizziness, presyncope, syncope, bleeding, or neurologic sequela. The patient is tolerating medications without difficulties.    Past Medical History:  Diagnosis Date   Abnormal nuclear cardiac imaging test    Acute on chronic heart failure with mid-range ejection fraction (HFmEF) (HCC) 01/26/2022   Alcohol use 01/26/2022   Anal fissure    Aortic stenosis    Atrial fibrillation with RVR (HCC) 01/27/2022   Benign essential HTN 08/28/2017   Bilateral carotid bruits    COPD (chronic obstructive pulmonary disease) (HCC)    Coronary artery disease involving native coronary artery of native heart with angina pectoris (HCC)    Dyslipidemia    ED (erectile dysfunction)    Erectile dysfunction 06/29/2018    Hemorrhoids    Hypertension    Hypogonadism in male    Left bundle branch block (LBBB) 08/06/2018   Leg cramps    Light headed 08/28/2017   Near syncope 01/26/2022   Pain of left shoulder region    Petechiae    Pneumonia 2016   S/P aortic valve replacement with bioprosthetic valve 08/06/2018   25 mm Edwards Intuity Elite stented bovine pericardial bioprosthetic tissue valve  [id number] Model: 8300AB   S/P AVR (aortic valve replacement)  08/06/2018   25 mm Edwards Intuity Elite stented bovine pericardial bioprosthetic tissue valve  [id number] Model: 8300AB   S/P CABG x 1 08/06/2018   SVG to RCA, EVH via right thigh   Smoking    Tachycardia, unspecified 01/28/2022   Tobacco use 01/26/2022   Vitamin D deficiency    Past Surgical History:  Procedure Laterality Date   AORTIC VALVE REPLACEMENT N/A 08/06/2018   Procedure: AORTIC VALVE REPLACEMENT (AVR);  Surgeon: Purcell Nails, MD;  Location: [facility] OR;  Service: Open Heart Surgery;  Laterality: N/A;   APPENDECTOMY     COLONOSCOPY W/ POLYPECTOMY     x 2   CORONARY ARTERY BYPASS GRAFT N/A 08/06/2018   Procedure: CORONARY ARTERY BYPASS GRAFTING (CABG) times one using right greater saphenous vein harvested endoscopically and aortic valve replacement.;  Surgeon: Purcell Nails, MD;  Location: [facility] OR;  Service: Open Heart Surgery;  Laterality: N/A;   CORONARY/GRAFT ANGIOGRAPHY N/A 11/21/2020   Procedure: CORONARY/GRAFT ANGIOGRAPHY;  Surgeon: Nicki Guadalajara  A, MD;  Location: [facility] INVASIVE CV LAB;  Service: Cardiovascular;  Laterality: N/A;   Lung Biospy Right    lower lung   RIGHT/LEFT HEART CATH AND CORONARY ANGIOGRAPHY N/A 06/30/2018   Procedure: RIGHT/LEFT HEART CATH AND CORONARY ANGIOGRAPHY;  Surgeon: Lyn Records, MD;  Location: [facility] INVASIVE CV LAB;  Service: Cardiovascular;  Laterality: N/A;   TEE WITHOUT CARDIOVERSION N/A 08/06/2018   Procedure: TRANSESOPHAGEAL ECHOCARDIOGRAM (TEE);  Surgeon: Purcell Nails, MD;  Location: [facility] OR;  Service:  Open Heart Surgery;  Laterality: N/A;   TOOTH EXTRACTION N/A 07/30/2018   Procedure: Extraction of tooth #5 w/ Alveoloplasty and Gross debridement of remaining dentition.;  Surgeon: Charlynne Pander, DDS;  Location: [facility] OR;  Service: Oral Surgery;  Laterality: N/A;     Current Outpatient Medications  Medication Sig Dispense Refill   albuterol (PROVENTIL HFA;VENTOLIN HFA) 108 (90 Base) MCG/ACT inhaler Inhale 2 puffs into the lungs every 6 (six) hours as needed for wheezing or shortness of breath.      aspirin EC 81 MG tablet Take 1 tablet (81 mg total) by mouth daily. 90 tablet 3   atorvastatin (LIPITOR) 40 MG tablet Take 20 mg by mouth daily.     D3-50 1.25 MG (50000 UT) capsule Take 50,000 Units by mouth once a week. No set day     furosemide (LASIX) 20 MG tablet Take 1 tablet (20 mg total) by mouth daily. 30 tablet 1   lisinopril (ZESTRIL) 20 MG tablet Take 1 tablet (20 mg total) by mouth daily. 90 tablet 3   metoprolol succinate (TOPROL-XL) 25 MG 24 hr tablet Take 1 tablet (25 mg total) by mouth daily. 30 tablet 6   nicotine (NICODERM CQ - DOSED IN MG/24 HOURS) 21 mg/24hr patch Place 1 patch (21 mg total) onto the skin daily. 28 patch 0   ranolazine (RANEXA) 1000 MG SR tablet Take 1 tablet (1,000 mg total) by mouth 2 (two) times daily. 180 tablet 3   No current facility-administered medications for this visit.    Allergies:   Patient has no known allergies.   Social History:  The patient  reports that he quit smoking about 3 years ago. His smoking use included cigarettes. He has a 50.00 pack-year smoking history. He has never used smokeless tobacco. He reports current alcohol use of about 14.0 standard drinks of alcohol per week. He reports that he does not use drugs.   Family History:  The patient's family history includes Coronary artery disease in his mother; Hypertension in his father and mother.    ROS:  Please see the history of present illness.   Otherwise, review of systems is  positive for none.   All other systems are reviewed and negative.    PHYSICAL EXAM: VS:  BP 124/72   Pulse 73   Ht 5\' 11"  (1.803 m)   Wt 234 lb 6.4 oz (106.3 kg)   SpO2 93%   BMI 32.69 kg/m  , BMI Body mass index is 32.69 kg/m. GEN: Well nourished, well developed, in no acute distress  HEENT: normal  Neck: no JVD, carotid bruits, or masses Cardiac: RRR; no murmurs, rubs, or gallops,no edema  Respiratory:  clear to auscultation bilaterally, normal work of breathing GI: soft, nontender, nondistended, + BS MS: no deformity or atrophy  Skin: warm and dry Neuro:  Strength and sensation are intact Psych: euthymic mood, full affect  EKG:  EKG is ordered today. Personal review of the ekg ordered shows sinus rhythm,  rate 73, IVCD  Recent Labs: 01/26/2022: B Natriuretic Peptide 56.4 01/27/2022: Magnesium 2.2; TSH 4.474 02/26/2022: BUN 19; Creatinine, Ser 0.91; Hemoglobin 14.7; Platelets 164; Potassium 4.0; Sodium 139    Lipid Panel     Component Value Date/Time   CHOL 139 06/29/2018 0938   TRIG 173 (H) 06/29/2018 0938   HDL 38 (L) 06/29/2018 0938   CHOLHDL 3.7 06/29/2018 0938   LDLCALC 66 06/29/2018 0938     Wt Readings from Last 3 Encounters:  04/02/22 234 lb 6.4 oz (106.3 kg)  03/19/22 235 lb (106.6 kg)  01/29/22 223 lb 6.4 oz (101.3 kg)      Other studies Reviewed: Additional studies/ records that were reviewed today include: TTE 01/27/22  Review of the above records today demonstrates:   1. Left ventricular ejection fraction, by estimation, is 50 to 55%. The  left ventricle has low normal function. Left ventricular endocardial  border not optimally defined to evaluate regional wall motion. There is  mild concentric left ventricular  hypertrophy. Left ventricular diastolic parameters are consistent with  Grade I diastolic dysfunction (impaired relaxation).   2. Right ventricular systolic function is normal. The right ventricular  size is normal. Tricuspid  regurgitation signal is inadequate for assessing  PA pressure.   3. The mitral valve is grossly normal. Trivial mitral valve  regurgitation.   4. The aortic valve has been repaired/replaced. Aortic valve  regurgitation is not visualized. Echo findings are consistent with normal  structure and function of the aortic valve prosthesis. Aortic valve mean  gradient measures 8.0 mmHg.   5. Aortic dilatation noted. There is borderline dilatation of the  ascending aorta, measuring 37 mm.   6. The inferior vena cava is normal in size with greater than 50%  respiratory variability, suggesting right atrial pressure of 3 mmHg.   Cardiac monitor 02/26/2022 personally reviewed  Markedly abnormal event monitor.  Patient has significant pauses lasting the longest at 5.4 seconds.  Patient also had baseline intraventricular conduction delay.  There were episodes of atrial runs also.  In view of this the patient was advised to go to the nearest emergency room.  He was advised not to drive himself but have a family member drive him to the nearest emergency room and he agrees.  I discussed this with my nurse conveyed the report and the findings with the patient.  This was done yesterday.   ASSESSMENT AND PLAN:  1.  SVT: Has had minimal episodes over the last few months.  He is currently on Toprol-XL 25 mg daily.  He is also not had any episodes of near syncope.  We Vinia Jemmott continue with current management.  2.  Near syncope: Had episodes on his cardiac monitor that appeared to be sinus arrest.  Has had no further episodes on lower dose of beta-blocker.  No changes.    Current medicines are reviewed at length with the patient today.   The patient does not have concerns regarding his medicines.  The following changes were made today:  none  Labs/ tests ordered today include:  Orders Placed This Encounter  Procedures   EKG 12-Lead     Disposition:   FU with Elijan Googe 6 months  Signed, Porchea Charrier Jorja Loa, MD  04/02/2022 4:35 PM     [facility] [address] [place] [address] [phone] (office) [phone] (fax)

## 2022-04-09 ENCOUNTER — Other Ambulatory Visit: Payer: Self-pay | Admitting: Cardiology

## 2022-04-09 ENCOUNTER — Other Ambulatory Visit: Payer: Self-pay

## 2022-04-09 ENCOUNTER — Inpatient Hospital Stay (HOSPITAL_COMMUNITY)
Admission: AD | Admit: 2022-04-09 | Discharge: 2022-04-11 | DRG: 243 | Disposition: A | Discharge status: Home / Self Care | Payer: Medicare HMO | Source: Other Acute Inpatient Hospital | Attending: Cardiology | Admitting: Cardiology

## 2022-04-09 DIAGNOSIS — Z79899 Other long term (current) drug therapy: Secondary | ICD-10-CM

## 2022-04-09 DIAGNOSIS — Z952 Presence of prosthetic heart valve: Secondary | ICD-10-CM

## 2022-04-09 DIAGNOSIS — I2510 Atherosclerotic heart disease of native coronary artery without angina pectoris: Secondary | ICD-10-CM | POA: Diagnosis present

## 2022-04-09 DIAGNOSIS — I110 Hypertensive heart disease with heart failure: Secondary | ICD-10-CM | POA: Diagnosis present

## 2022-04-09 DIAGNOSIS — R202 Paresthesia of skin: Secondary | ICD-10-CM | POA: Diagnosis not present

## 2022-04-09 DIAGNOSIS — Z87891 Personal history of nicotine dependence: Secondary | ICD-10-CM | POA: Diagnosis not present

## 2022-04-09 DIAGNOSIS — R32 Unspecified urinary incontinence: Secondary | ICD-10-CM | POA: Diagnosis not present

## 2022-04-09 DIAGNOSIS — I5022 Chronic systolic (congestive) heart failure: Secondary | ICD-10-CM | POA: Diagnosis present

## 2022-04-09 DIAGNOSIS — I459 Conduction disorder, unspecified: Secondary | ICD-10-CM | POA: Diagnosis not present

## 2022-04-09 DIAGNOSIS — F17210 Nicotine dependence, cigarettes, uncomplicated: Secondary | ICD-10-CM | POA: Diagnosis not present

## 2022-04-09 DIAGNOSIS — I495 Sick sinus syndrome: Principal | ICD-10-CM | POA: Diagnosis present

## 2022-04-09 DIAGNOSIS — I447 Left bundle-branch block, unspecified: Secondary | ICD-10-CM | POA: Diagnosis not present

## 2022-04-09 DIAGNOSIS — Z7982 Long term (current) use of aspirin: Secondary | ICD-10-CM | POA: Diagnosis not present

## 2022-04-09 DIAGNOSIS — I4891 Unspecified atrial fibrillation: Secondary | ICD-10-CM | POA: Diagnosis present

## 2022-04-09 DIAGNOSIS — E785 Hyperlipidemia, unspecified: Secondary | ICD-10-CM | POA: Diagnosis not present

## 2022-04-09 DIAGNOSIS — Z8249 Family history of ischemic heart disease and other diseases of the circulatory system: Secondary | ICD-10-CM

## 2022-04-09 DIAGNOSIS — I509 Heart failure, unspecified: Secondary | ICD-10-CM | POA: Diagnosis not present

## 2022-04-09 DIAGNOSIS — Z951 Presence of aortocoronary bypass graft: Secondary | ICD-10-CM | POA: Diagnosis not present

## 2022-04-09 DIAGNOSIS — I471 Supraventricular tachycardia: Secondary | ICD-10-CM | POA: Diagnosis present

## 2022-04-09 DIAGNOSIS — I4589 Other specified conduction disorders: Secondary | ICD-10-CM | POA: Diagnosis not present

## 2022-04-09 DIAGNOSIS — R55 Syncope and collapse: Secondary | ICD-10-CM

## 2022-04-09 DIAGNOSIS — Z8701 Personal history of pneumonia (recurrent): Secondary | ICD-10-CM

## 2022-04-09 DIAGNOSIS — J449 Chronic obstructive pulmonary disease, unspecified: Secondary | ICD-10-CM | POA: Diagnosis present

## 2022-04-09 DIAGNOSIS — J439 Emphysema, unspecified: Secondary | ICD-10-CM | POA: Diagnosis not present

## 2022-04-09 HISTORY — DX: Syncope and collapse: R55

## 2022-04-09 MED ORDER — ONDANSETRON HCL 4 MG/2ML IJ SOLN
4.0000 mg | Freq: Four times a day (QID) | INTRAMUSCULAR | Status: DC | PRN
Start: 2022-04-09 — End: 2022-04-11

## 2022-04-09 MED ORDER — ACETAMINOPHEN 325 MG PO TABS: 650.0000 mg | ORAL_TABLET | ORAL | Status: DC | PRN

## 2022-04-09 MED ORDER — HEPARIN SODIUM (PORCINE) 5000 UNIT/ML IJ SOLN
5000.0000 [IU] | Freq: Three times a day (TID) | INTRAMUSCULAR | Status: DC
  Administered 2022-04-10: 5000 [IU] via SUBCUTANEOUS
  Filled 2022-04-09: qty 1

## 2022-04-09 NOTE — H&P (Signed)
Cardiology Admission History and Physical:   Patient ID: Blake Griffin MRN: [id number]; DOB: 09/11/1956   Admission date: 04/09/2022  PCP:  Helen Hashimoto., MD   [facility] Providers Cardiologist:  Jenean Lindau, MD  Electrophysiologist:  Constance Haw, MD       Chief Complaint:  syncope  Patient Profile:   Blake Griffin is a 66 y.o. male who is being seen 04/09/2022 for the evaluation of syncope.  History of Present Illness:   Blake Griffin is a 66 year old male with pmh sx for cad s/p CABG, bioprosthetic avr, htn, extensive smoking history, recently diagnosed svt who is coming with episode of syncope. He had syncopal episode last night and then another episode of pre-syncope this morning hence he went to the [facility] hospital. He has ben having these episodes last month. Zio patch last month had shown multiple episodes of sinus pauses; his bb dose was half and was told to follow up as symptoms were thought to be due to svt. However, now coming again with a similar episode. Denies any cp or sob or any other symptoms. At [facility], he ctpe which was negative and all work up was negative.    Past Medical History:  Diagnosis Date   Abnormal nuclear cardiac imaging test    Acute on chronic heart failure with mid-range ejection fraction (HFmEF) ([place]) 01/26/2022   Alcohol use 01/26/2022   Anal fissure    Aortic stenosis    Atrial fibrillation with RVR ([place]) 01/27/2022   Benign essential HTN 08/28/2017   Bilateral carotid bruits    COPD (chronic obstructive pulmonary disease) (HCC)    Coronary artery disease involving native coronary artery of native heart with angina pectoris (HCC)    Dyslipidemia    ED (erectile dysfunction)    Erectile dysfunction 06/29/2018   Hemorrhoids    Hypertension    Hypogonadism in male    Left bundle branch block (LBBB) 08/06/2018   Leg cramps    Light headed 08/28/2017   Near syncope 01/26/2022   Pain of left shoulder region    Petechiae     Pneumonia 2016   S/P aortic valve replacement with bioprosthetic valve 08/06/2018   25 mm Edwards Intuity Elite stented bovine pericardial bioprosthetic tissue valve  [id number] Model: 8300AB   S/P AVR (aortic valve replacement)  08/06/2018   25 mm Edwards Intuity Elite stented bovine pericardial bioprosthetic tissue valve  [id number] Model: 8300AB   S/P CABG x 1 08/06/2018   SVG to RCA, EVH via right thigh   Smoking    Tachycardia, unspecified 01/28/2022   Tobacco use 01/26/2022   Vitamin D deficiency     Past Surgical History:  Procedure Laterality Date   AORTIC VALVE REPLACEMENT N/A 08/06/2018   Procedure: AORTIC VALVE REPLACEMENT (AVR);  Surgeon: Rexene Alberts, MD;  Location: [facility];  Service: Open Heart Surgery;  Laterality: N/A;   APPENDECTOMY     COLONOSCOPY W/ POLYPECTOMY     x 2   CORONARY ARTERY BYPASS GRAFT N/A 08/06/2018   Procedure: CORONARY ARTERY BYPASS GRAFTING (CABG) times one using right greater saphenous vein harvested endoscopically and aortic valve replacement.;  Surgeon: Rexene Alberts, MD;  Location: [place];  Service: Open Heart Surgery;  Laterality: N/A;   CORONARY/GRAFT ANGIOGRAPHY N/A 11/21/2020   Procedure: CORONARY/GRAFT ANGIOGRAPHY;  Surgeon: Troy Sine, MD;  Location: [place] [facility];  Service: Cardiovascular;  Laterality: N/A;   Lung Biospy Right  lower lung   RIGHT/LEFT HEART CATH AND CORONARY ANGIOGRAPHY N/A 06/30/2018   Procedure: RIGHT/LEFT HEART CATH AND CORONARY ANGIOGRAPHY;  Surgeon: Lyn Records, MD;  Location: [facility];  Service: Cardiovascular;  Laterality: N/A;   TEE WITHOUT CARDIOVERSION N/A 08/06/2018   Procedure: TRANSESOPHAGEAL ECHOCARDIOGRAM (TEE);  Surgeon: Purcell Nails, MD;  Location: [facility] OR;  Service: Open Heart Surgery;  Laterality: N/A;   TOOTH EXTRACTION N/A 07/30/2018   Procedure: Extraction of tooth #5 w/ Alveoloplasty and Gross debridement of remaining dentition.;  Surgeon: Charlynne Pander, DDS;   Location: [facility] OR;  Service: Oral Surgery;  Laterality: N/A;     Medications Prior to Admission: Prior to Admission medications   Medication Sig Start Date End Date Taking? Authorizing Provider  albuterol (PROVENTIL HFA;VENTOLIN HFA) 108 (90 Base) MCG/ACT inhaler Inhale 2 puffs into the lungs every 6 (six) hours as needed for wheezing or shortness of breath.     [provider]  aspirin EC 81 MG tablet Take 1 tablet (81 mg total) by mouth daily. 02/12/21   Revankar, Aundra Dubin, MD  atorvastatin (LIPITOR) 40 MG tablet TAKE HALF TABLET (20MG ) BY MOUTH DAILY. 04/09/22   Revankar, 04/11/22, MD  D3-50 1.25 MG (50000 UT) capsule Take 50,000 Units by mouth once a week. No set day 03/03/19   [provider]  furosemide (LASIX) 20 MG tablet Take 1 tablet (20 mg total) by mouth daily. 02/07/22 02/07/23  Revankar, 02/09/23, MD  lisinopril (ZESTRIL) 20 MG tablet Take 1 tablet (20 mg total) by mouth daily. 04/01/22 04/01/23  Revankar, 04/03/23, MD  metoprolol succinate (TOPROL-XL) 25 MG 24 hr tablet Take 1 tablet (25 mg total) by mouth daily. 02/27/22   03/01/22, PA-C  nicotine (NICODERM CQ - DOSED IN MG/24 HOURS) 21 mg/24hr patch Place 1 patch (21 mg total) onto the skin daily. 01/30/22   02/01/22, MD  ranolazine (RANEXA) 1000 MG SR tablet Take 1 tablet (1,000 mg total) by mouth 2 (two) times daily. 01/30/22   Revankar, 02/01/22, MD     Allergies:   No Known Allergies  Social History:   Social History   Socioeconomic History   Marital status: Married    Spouse name: Not on file   Number of children: 5   Years of education: Not on file   Highest education level: Not on file  Occupational History   Not on file  Tobacco Use   Smoking status: Former    Packs/day: 1.00    Years: 50.00    Total pack years: 50.00    Types: Cigarettes    Quit date: 08/06/2018    Years since quitting: 3.6   Smokeless tobacco: Never  Vaping Use   Vaping Use: Never used  Substance and Sexual  Activity   Alcohol use: Yes    Alcohol/week: 14.0 standard drinks of alcohol    Types: 14 Shots of liquor per week    Comment: Bacardi rum daily   Drug use: Never   Sexual activity: Not on file  Other Topics Concern   Not on file  Social History Narrative   Not on file   Social Determinants of Health   Financial Resource Strain: Not on file  Food Insecurity: Not on file  Transportation Needs: Not on file  Physical Activity: Not on file  Stress: Not on file  Social Connections: Not on file  Intimate Partner Violence: Not on file    Family History:  The patient's family history includes Coronary artery disease in his mother; Hypertension in his father and mother.    ROS:  Please see the history of present illness.  All other ROS reviewed and negative.     Physical Exam/Data:   Vitals:   04/09/22 2217 04/09/22 2219  BP:  (!) 150/79  Pulse:  61  Resp:  20  Temp:  98.7 F (37.1 C)  TempSrc:  Oral  SpO2:  95%  Weight: 103.9 kg   Height: 5\' 11"  (1.803 m)    No intake or output data in the 24 hours ending 04/09/22 2341    04/09/2022   10:17 PM 04/02/2022    3:39 PM 03/19/2022    8:21 AM  Last 3 Weights  Weight (lbs) 229 lb 1.6 oz 234 lb 6.4 oz 235 lb  Weight (kg) 103.919 kg 106.323 kg 106.595 kg     Body mass index is 31.95 kg/m.  General:  Well nourished, well developed, in no acute distress HEENT: normal Neck: no JVD Vascular: No carotid bruits; Distal pulses 2+ bilaterally   Cardiac:  normal S1, S2; RRR; no murmur  Lungs:  clear to auscultation bilaterally, no wheezing, rhonchi or rales  Abd: soft, nontender, no hepatomegaly  Ext: mild edema Musculoskeletal:  No deformities, BUE and BLE strength normal and equal Skin: warm and dry  Neuro:  CNs 2-12 intact, no focal abnormalities noted Psych:  Normal affect    EKG:  The ECG that was done  was personally reviewed and demonstrates lbbb  Laboratory Data:  High Sensitivity Troponin:  No results for  input(s): "TROPONINIHS" in the last 720 hours.    ChemistryNo results for input(s): "NA", "K", "CL", "CO2", "GLUCOSE", "BUN", "CREATININE", "CALCIUM", "MG", "GFRNONAA", "GFRAA", "ANIONGAP" in the last 168 hours.  No results for input(s): "PROT", "ALBUMIN", "AST", "ALT", "ALKPHOS", "BILITOT" in the last 168 hours. Lipids No results for input(s): "CHOL", "TRIG", "HDL", "LABVLDL", "LDLCALC", "CHOLHDL" in the last 168 hours. HematologyNo results for input(s): "WBC", "RBC", "HGB", "HCT", "MCV", "MCH", "MCHC", "RDW", "PLT" in the last 168 hours. Thyroid No results for input(s): "TSH", "FREET4" in the last 168 hours. BNPNo results for input(s): "BNP", "PROBNP" in the last 168 hours.  DDimer No results for input(s): "DDIMER" in the last 168 hours.  Relevant CV Studies:  Additional studies/ records that were reviewed today include: TTE 01/27/22  Review of the above records today demonstrates:   1. Left ventricular ejection fraction, by estimation, is 50 to 55%. The  left ventricle has low normal function. Left ventricular endocardial  border not optimally defined to evaluate regional wall motion. There is  mild concentric left ventricular  hypertrophy. Left ventricular diastolic parameters are consistent with  Grade I diastolic dysfunction (impaired relaxation).   2. Right ventricular systolic function is normal. The right ventricular  size is normal. Tricuspid regurgitation signal is inadequate for assessing  PA pressure.   3. The mitral valve is grossly normal. Trivial mitral valve  regurgitation.   4. The aortic valve has been repaired/replaced. Aortic valve  regurgitation is not visualized. Echo findings are consistent with normal  structure and function of the aortic valve prosthesis. Aortic valve mean  gradient measures 8.0 mmHg.   5. Aortic dilatation noted. There is borderline dilatation of the  ascending aorta, measuring 37 mm.   6. The inferior vena cava is normal in size with greater  than 50%  respiratory variability, suggesting right atrial pressure of 3 mmHg.    Cardiac monitor 02/26/2022  personally reviewed  Markedly abnormal event monitor.  Patient has significant pauses lasting the longest at 5.4 seconds.  Patient also had baseline intraventricular conduction delay.  There were episodes of atrial runs also.  Radiology/Studies:  No results found.   Assessment and Plan:   #Syncope #Atrial fibrillation/svt/atrial flutter  Patient coming in with syncope from osh. Has had multiple similar episodes Telemetry Check electrolytes Hold bb for now Recently done echo so will not repeat for now Most likely needs ppm. Will keep npo at midnight. Ep consult in am  #Cad s/p cabg #Bioprosthetic avr  C/w aspirin and statin Aggressive management of risk factors C/w ranolazine  #Htn C/w lisinopril C/w lasix  #Hx of smoking C/w nioctine patch  For questions or updates, please contact [place] Please consult [email or web address] for contact info under     Signed, Jaci Lazier, MD  04/09/2022 11:41 PM

## 2022-04-10 ENCOUNTER — Inpatient Hospital Stay (HOSPITAL_COMMUNITY)
Admission: AD | Disposition: A | Discharge status: Home / Self Care | Payer: Self-pay | Source: Other Acute Inpatient Hospital | Attending: Cardiology

## 2022-04-10 DIAGNOSIS — I495 Sick sinus syndrome: Secondary | ICD-10-CM

## 2022-04-10 HISTORY — PX: PACEMAKER IMPLANT: EP1218

## 2022-04-10 LAB — COMPREHENSIVE METABOLIC PANEL
ALT: 22 U/L (ref 0–44)
AST: 18 U/L (ref 15–41)
Albumin: 3.8 g/dL (ref 3.5–5.0)
Alkaline Phosphatase: 45 U/L (ref 38–126)
Anion gap: 9 (ref 5–15)
BUN: 15 mg/dL (ref 8–23)
CO2: 25 mmol/L (ref 22–32)
Calcium: 9.1 mg/dL (ref 8.9–10.3)
Chloride: 106 mmol/L (ref 98–111)
Creatinine, Ser: 0.89 mg/dL (ref 0.61–1.24)
GFR, Estimated: >60 mL/min (ref >60)
Glucose, Bld: 112 mg/dL — ABNORMAL HIGH (ref 70–99)
Potassium: 3.7 mmol/L (ref 3.5–5.1)
Sodium: 140 mmol/L (ref 135–145)
Total Bilirubin: 0.6 mg/dL (ref 0.3–1.2)
Total Protein: 6.2 g/dL — ABNORMAL LOW (ref 6.5–8.1)

## 2022-04-10 LAB — BRAIN NATRIURETIC PEPTIDE: B Natriuretic Peptide: 24.5 pg/mL (ref 0.0–100.0)

## 2022-04-10 LAB — T4, FREE: Free T4: 0.81 ng/dL (ref 0.61–1.12)

## 2022-04-10 LAB — PROTIME-INR
INR: 1.1 (ref 0.8–1.2)
Prothrombin Time: 13.7 seconds (ref 11.4–15.2)

## 2022-04-10 LAB — TSH: TSH: 2.792 u[IU]/mL (ref 0.350–4.500)

## 2022-04-10 LAB — SURGICAL PCR SCREEN
MRSA, PCR: NEGATIVE
Staphylococcus aureus: NEGATIVE

## 2022-04-10 LAB — MAGNESIUM: Magnesium: 2.2 mg/dL (ref 1.7–2.4)

## 2022-04-10 SURGERY — PACEMAKER IMPLANT

## 2022-04-10 MED ORDER — LISINOPRIL 20 MG PO TABS
20.0000 mg | ORAL_TABLET | Freq: Every day | ORAL | Status: DC
  Administered 2022-04-10 – 2022-04-11 (×2): 20 mg via ORAL
  Filled 2022-04-10 (×2): qty 1

## 2022-04-10 MED ORDER — ASPIRIN 81 MG PO TBEC
81.0000 mg | DELAYED_RELEASE_TABLET | Freq: Every day | ORAL | Status: DC
  Administered 2022-04-10 – 2022-04-11 (×2): 81 mg via ORAL
  Filled 2022-04-10 (×2): qty 1

## 2022-04-10 MED ORDER — CEFAZOLIN SODIUM-DEXTROSE 2-4 GM/100ML-% IV SOLN
INTRAVENOUS | Status: AC
  Filled 2022-04-10: qty 100

## 2022-04-10 MED ORDER — FENTANYL CITRATE (PF) 100 MCG/2ML IJ SOLN
INTRAMUSCULAR | Status: AC
  Filled 2022-04-10: qty 2

## 2022-04-10 MED ORDER — FENTANYL CITRATE (PF) 100 MCG/2ML IJ SOLN
INTRAMUSCULAR | Status: DC | PRN
  Administered 2022-04-10: 25 ug via INTRAVENOUS

## 2022-04-10 MED ORDER — LIDOCAINE HCL (PF) 1 % IJ SOLN
INTRAMUSCULAR | Status: AC
  Filled 2022-04-10: qty 30

## 2022-04-10 MED ORDER — CHLORHEXIDINE GLUCONATE 4 % EX LIQD
60.0000 mL | Freq: Once | CUTANEOUS | Status: AC
  Administered 2022-04-10: 4 via TOPICAL
  Filled 2022-04-10: qty 60

## 2022-04-10 MED ORDER — SODIUM CHLORIDE 0.9 % IV SOLN: INTRAVENOUS | Status: DC

## 2022-04-10 MED ORDER — RANOLAZINE ER 500 MG PO TB12
1000.0000 mg | ORAL_TABLET | Freq: Two times a day (BID) | ORAL | Status: DC
  Administered 2022-04-10 – 2022-04-11 (×3): 1000 mg via ORAL
  Filled 2022-04-10 (×3): qty 2

## 2022-04-10 MED ORDER — LIDOCAINE HCL 1 % IJ SOLN
INTRAMUSCULAR | Status: AC
  Filled 2022-04-10: qty 20

## 2022-04-10 MED ORDER — MIDAZOLAM HCL 5 MG/5ML IJ SOLN
INTRAMUSCULAR | Status: AC
  Filled 2022-04-10: qty 5

## 2022-04-10 MED ORDER — MIDAZOLAM HCL 5 MG/5ML IJ SOLN
INTRAMUSCULAR | Status: DC | PRN
  Administered 2022-04-10: 1 mg via INTRAVENOUS

## 2022-04-10 MED ORDER — CEFAZOLIN SODIUM-DEXTROSE 2-4 GM/100ML-% IV SOLN
2.0000 g | INTRAVENOUS | Status: AC
  Administered 2022-04-10: 2 g via INTRAVENOUS
  Filled 2022-04-10: qty 100

## 2022-04-10 MED ORDER — HEPARIN (PORCINE) IN NACL 1000-0.9 UT/500ML-% IV SOLN
INTRAVENOUS | Status: AC
  Filled 2022-04-10: qty 500

## 2022-04-10 MED ORDER — SODIUM CHLORIDE 0.9 % IV SOLN
INTRAVENOUS | Status: AC
  Filled 2022-04-10: qty 2

## 2022-04-10 MED ORDER — SODIUM CHLORIDE 0.9 % IV SOLN
80.0000 mg | INTRAVENOUS | Status: AC
  Administered 2022-04-10: 80 mg
  Filled 2022-04-10: qty 2

## 2022-04-10 MED ORDER — CEFAZOLIN SODIUM-DEXTROSE 1-4 GM/50ML-% IV SOLN
1.0000 g | Freq: Four times a day (QID) | INTRAVENOUS | Status: AC
  Administered 2022-04-10 – 2022-04-11 (×3): 1 g via INTRAVENOUS
  Filled 2022-04-10 (×3): qty 50

## 2022-04-10 MED ORDER — NICOTINE 21 MG/24HR TD PT24
21.0000 mg | MEDICATED_PATCH | Freq: Every day | TRANSDERMAL | Status: DC
  Administered 2022-04-10 – 2022-04-11 (×2): 21 mg via TRANSDERMAL
  Filled 2022-04-10 (×2): qty 1

## 2022-04-10 MED ORDER — LIDOCAINE HCL (PF) 1 % IJ SOLN
INTRAMUSCULAR | Status: DC | PRN
  Administered 2022-04-10: 50 mL

## 2022-04-10 MED ORDER — HEPARIN (PORCINE) IN NACL 1000-0.9 UT/500ML-% IV SOLN
INTRAVENOUS | Status: DC | PRN
  Administered 2022-04-10: 500 mL

## 2022-04-10 MED ORDER — ALBUTEROL SULFATE (2.5 MG/3ML) 0.083% IN NEBU: 2.5000 mg | INHALATION_SOLUTION | Freq: Four times a day (QID) | RESPIRATORY_TRACT | Status: DC | PRN

## 2022-04-10 MED ORDER — ATORVASTATIN CALCIUM 80 MG PO TABS
80.0000 mg | ORAL_TABLET | Freq: Every day | ORAL | Status: DC
  Administered 2022-04-10 – 2022-04-11 (×2): 80 mg via ORAL
  Filled 2022-04-10 (×2): qty 1

## 2022-04-10 MED ORDER — FUROSEMIDE 20 MG PO TABS
20.0000 mg | ORAL_TABLET | Freq: Every day | ORAL | Status: DC
  Administered 2022-04-10 – 2022-04-11 (×2): 20 mg via ORAL
  Filled 2022-04-10 (×2): qty 1

## 2022-04-10 SURGICAL SUPPLY — 12 items
CABLE SURGICAL S-101-97-12 (CABLE) ×2 IMPLANT
KIT ACCESSORY SELECTRA FIX CVD (MISCELLANEOUS) ×1 IMPLANT
LEAD SELECTRA 3D-55-42 (CATHETERS) ×1 IMPLANT
LEAD SOLIA S PRO MRI 53 (Lead) ×1 IMPLANT
LEAD SOLIA S PRO MRI 60 (Lead) ×1 IMPLANT
MAT PREVALON FULL STRYKER (MISCELLANEOUS) ×1 IMPLANT
PACEMAKER EDORA 8DR-T MRI (Pacemaker) ×1 IMPLANT
PAD DEFIB RADIO PHYSIO CONN (PAD) ×2 IMPLANT
SHEATH 7FR PRELUDE SNAP 13 (SHEATH) ×1 IMPLANT
SHEATH 9FR PRELUDE SNAP 13 (SHEATH) ×1 IMPLANT
SHEATH PROBE COVER 6X72 (BAG) ×1 IMPLANT
TRAY PACEMAKER INSERTION (PACKS) ×2 IMPLANT
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
## 2022-04-11 ENCOUNTER — Encounter (HOSPITAL_COMMUNITY): Payer: Self-pay | Admitting: Cardiology

## 2022-04-11 ENCOUNTER — Inpatient Hospital Stay (HOSPITAL_COMMUNITY): Payer: Medicare HMO

## 2022-04-11 DIAGNOSIS — I495 Sick sinus syndrome: Secondary | ICD-10-CM | POA: Diagnosis not present

## 2022-04-11 LAB — HEMOGLOBIN A1C
Hgb A1c MFr Bld: 6.3 % — ABNORMAL HIGH (ref 4.8–5.6)
Mean Plasma Glucose: 134 mg/dL

## 2022-04-11 MED ORDER — ACETAMINOPHEN 325 MG PO TABS: 650.0000 mg | ORAL_TABLET | ORAL | Status: DC | PRN

## 2022-04-11 MED FILL — Lidocaine HCl Local Preservative Free (PF) Inj 1%: INTRAMUSCULAR | Qty: 30 | Status: AC

## 2022-04-11 NOTE — TOC Transition Note (Signed)
Transition of Care [facility]) - CM/SW Discharge Note   Patient Details  Name: Blake Griffin MRN: [id number] Date of Birth: November 29, 1955  [facility] CM/SW Contact:  Leone Haven, RN Phone Number: 04/11/2022, 10:07 AM   Clinical Narrative:    Patient is s/p pacemaker, he is for dc today, but need to receive IV ABX prior to dc.  He wife will transport him home today. He has no needs.          Patient Goals and CMS Choice        Discharge Placement                       Discharge Plan and Services                                     Social Determinants of Health (SDOH) Interventions     Readmission Risk Interventions    01/29/2022    2:25 PM  Readmission Risk Prevention Plan  Post Dischage Appt Complete  Medication Screening Complete  Transportation Screening Complete

## 2022-04-11 NOTE — Progress Notes (Signed)
04/11/22 1030  Clinical Encounter Type  Visited With Patient  Visit Type Initial  Referral From Nurse Cloyde Reams. Jimmye Norman)  Consult/Referral To Chaplain Melvenia Beam)  Recommendations Advance Directive Education   Chaplain responded to spiritual care consult request for Advance Directive education with Mr. Mark Hassey. Met with Mr. Keenum at patient's bedside. Mr. Breshears stated that he intends to list his wife, Jobe Mutch as his health care agent.    Chaplain provided the Advance Directive packet as well as education on Advance Directives-documents an individual completes to communicate their health care directions in advance of a time when they may need them. Chaplain informed Mr. Groeneveld the documents which may be completed here in the hospital are the Living Will and [place]. Mr. Gargis is being discharged and will take them for review.   Chaplain informed patient that the [place] is a legal document in which an individual names another person, as their [place], to communicate his health care decisions, when he, himself is not able to make them for himself. The Health Care Agent's function can be temporary or permanent depending on his ability to make and communicate those decisions independently.  Chaplain informed Mr. Blansett in the absence of a [place], the state of [place] directs health care providers to look to the following individuals in the order listed: legal guardian; an attorney-in-fact under a general power of attorney (POA) if that POA includes the right to make health care decisions; person's spouse; a 76 of his children; a 18 of adult brothers and sisters; or an individual who has an established relationship with you, who is acting in good faith and who can convey your wishes.  If none of these persons are available or willing to make medical decisions on a patient's behalf, the law allows  the patient's doctor to make decisions for them as long as another doctor agrees with those decisions.  Chaplain also informed the patient that the Health Care agent has no decision-making authority over any affairs other than those related to his medical care.   The chaplain further educated Mr. Kawahara that a Living Will is a legal document that allows his desires not to receive life-prolonging measures in the event that they have a condition that is incurable and will result in his death in a short period of time; they are unconscious, and doctors are confident that they will not regain consciousness; and/or they have advanced dementia or other substantial and irreversible loss of mental function.   The chaplain informed Mr. Hoffert that life-prolonging measures are medical treatments that would only serve to postpone death, including breathing machines, kidney dialysis, antibiotics, artificial nutrition and hydration (tube feeding), and similar forms of treatment and that if an individual is able to express their wishes, they may also make them known without the use of a Living Will, but in the event that he is not able to express his wishes, a Living Will allows medical providers and the his family and friends to ensure that they are not making decisions on his behalf, but rather serving as his voice to convey the decisions that he has already made.   Mr. Aderhold is aware that the decision to create an advance directive is his alone and he may choose not to complete the documents or may choose to complete one portion or both.  Mr. Cutting was informed that he can revoke the  documents at any time by striking through them and writing void or by completing new documents, but that it is also advisable that the individual verbally notify interested parties that his wishes have changed.   Mr. Adames is also aware that the document must be signed in the presence of a notary public and two witnesses and that this can be done  while the patient is still admitted to the hospital or after discharge in the community. If they decide to complete Advance Directives after being discharged from the hospital, they have been advised to notify all interested parties and to provide those documents to their physicians and loved ones in addition to bringing them to the hospital in the event of another hospitalization.   The chaplain informed the Mr. Morini that if he desires to proceed with completing Advance Directive Documentation while he is still admitted, notary services are typically available at [facility] between the hours of 1:00 and 3:30 Monday-Thursday.   When the patient is ready to have these documents completed, the patient should request that their nurse place a spiritual care consult and indicate that the patient is ready to have their advance directives notarized so that arrangements for witnesses and notary public can be made.   Please page spiritual care if the patient desires further education or has questions.   [address] [place], [place] [phone].

## 2022-04-11 NOTE — Discharge Instructions (Signed)
After Your Pacemaker   You have a Biotronik Pacemaker  ACTIVITY Do not lift your arm above shoulder height for 1 week after your procedure. After 7 days, you may progress as below.  You should remove your sling 24 hours after your procedure, unless otherwise instructed by your provider.     Thursday April 18, 2022  Friday April 19, 2022 Saturday April 20, 2022 Sunday April 21, 2022   Do not lift, push, pull, or carry anything over 10 pounds with the affected arm until 6 weeks (Thursday May 23, 2022 ) after your procedure.   You may drive AFTER your wound check, unless you have been told otherwise by your provider.   Ask your healthcare provider when you can go back to work   INCISION/Dressing If you are on a blood thinner such as Coumadin, Xarelto, Eliquis, Plavix, or Pradaxa please confirm with your provider when this should be resumed.   If large square, outer bandage is left in place, this can be removed after 24 hours from your procedure. Do not remove steri-strips or glue as below.   Monitor your Pacemaker site for redness, swelling, and drainage. Call the device clinic at [phone] if you experience these symptoms or fever/chills.  If your incision is sealed with Steri-strips or staples, you may shower 7 days after your procedure or when told by your provider. Do not remove the steri-strips or let the shower hit directly on your site. You may wash around your site with soap and water.    If you were discharged in a sling, please do not wear this during the day more than 48 hours after your surgery unless otherwise instructed. This may increase the risk of stiffness and soreness in your shoulder.   Avoid lotions, ointments, or perfumes over your incision until it is well-healed.  You may use a hot tub or a pool AFTER your wound check appointment if the incision is completely closed.  Pacemaker Alerts:  Some alerts are vibratory and others beep. These are NOT emergencies. Please  call our office to let us know. If this occurs at night or on weekends, it can wait until the next business day. Send a remote transmission.  If your device is capable of reading fluid status (for heart failure), you will be offered monthly monitoring to review this with you.   DEVICE MANAGEMENT Remote monitoring is used to monitor your pacemaker from home. This monitoring is scheduled every 91 days by our office. It allows [place] to keep an eye on the functioning of your device to ensure it is working properly. You will routinely see your Electrophysiologist annually (more often if necessary).   You should receive your ID card for your new device in 4-8 weeks. Keep this card with you at all times once received. Consider wearing a medical alert bracelet or necklace.  Your Pacemaker may be MRI compatible. This will be discussed at your next office visit/wound check.  You should avoid contact with strong electric or magnetic fields.   Do not use amateur (ham) radio equipment or electric (arc) welding torches. MP3 player headphones with magnets should not be used. Some devices are safe to use if held at least 12 inches (30 cm) from your Pacemaker. These include power tools, lawn mowers, and speakers. If you are unsure if something is safe to use, ask your health care provider.  When using your cell phone, hold it to the ear that is on the opposite side from the  Pacemaker. Do not leave your cell phone in a pocket over the Pacemaker.  You may safely use electric blankets, heating pads, computers, and microwave ovens.  Call the office right away if: You have chest pain. You feel more short of breath than you have felt before. You feel more light-headed than you have felt before. Your incision starts to open up.  This information is not intended to replace advice given to you by your health care provider. Make sure you discuss any questions you have with your health care provider.

## 2022-04-11 NOTE — Discharge Summary (Addendum)
ELECTROPHYSIOLOGY PROCEDURE DISCHARGE SUMMARY    Patient ID: Blake Griffin,  MRN: [id number], DOB/AGE: [birth year] 66 y.o.  Admit date: 04/09/2022 Discharge date: 04/11/2022  Primary Care Physician: Wilmer Floor., MD  Primary Cardiologist: Garwin Brothers, MD  Electrophysiologist: Regan Lemming, MD   Primary Discharge Diagnosis:  Syncope  Sinus node dysfunction status post pacemaker implantation this admission  No Known Allergies  Procedures This Admission:  1.  Implantation of a Biotronik dual chamber PPM on 04/10/2022 by Dr. Elberta Fortis. The patient received a Biotronik model number [id number] [place]    PPM with model number  [id number] [facility])   right atrial lead and  [phone] [facility])   right ventricular lead. There were no immediate post procedure complications. 2.  CXR on 04/11/22 demonstrated no pneumothorax status post device implantation.   Brief HPI: Blake Griffin is a 66 y.o.  male was admitted for recurrent syncope and electrophysiology team asked to see for consideration of PPM implantation.  Past medical history includes above.  The patient has had symptomatic bradycardia without reversible causes identified.  Risks, benefits, and alternatives to PPM implantation were reviewed with the patient who wished to proceed.   Hospital Course:  The patient was admitted and underwent implantation of a Biotronik dual chamber PPM with details as outlined above.  He was monitored on telemetry overnight which demonstrated appropriate pacing.  Left chest was without hematoma or ecchymosis.  The device was interrogated and found to be functioning normally.  CXR was obtained and demonstrated no pneumothorax status post device implantation.  Wound care, arm mobility, and restrictions were reviewed with the patient.  The patient was examined and considered stable for discharge to home.    Physical Exam: Vitals:   04/10/22 1958 04/11/22 0056 04/11/22 0550 04/11/22 0722  BP: (!) 142/76 (!)  113/56 126/73 135/73  Pulse: 69 78  68  Resp: 18 18 20 20   Temp: 98.9 F (37.2 C) (!) 97.4 F (36.3 C) 98.7 F (37.1 C) 98.4 F (36.9 C)  TempSrc: Oral Oral Oral Oral  SpO2: 93% 90% 90% 91%  Weight:   104.3 kg   Height:        GEN- The patient is well appearing, alert and oriented x 3 today.   HEENT: normocephalic, atraumatic; sclera clear, conjunctiva pink; hearing intact; oropharynx clear; neck supple, no JVP Lymph- no cervical lymphadenopathy Lungs- Clear to ausculation bilaterally, normal work of breathing.  No wheezes, rales, rhonchi Heart- Regular rate and rhythm, no murmurs, rubs or gallops, PMI not laterally displaced GI- soft, non-tender, non-distended, bowel sounds present, no hepatosplenomegaly Extremities- no clubbing, cyanosis, or edema; DP/PT/radial pulses 2+ bilaterally MS- no significant deformity or atrophy Skin- warm and dry, no rash or lesion, left chest without hematoma/ecchymosis Psych- euthymic mood, full affect Neuro- strength and sensation are intact   Labs:   Lab Results  Component Value Date   WBC 7.5 02/26/2022   HGB 14.7 02/26/2022   HCT 44.4 02/26/2022   MCV 98.4 02/26/2022   PLT 164 02/26/2022    Recent Labs  Lab 04/10/22 0014  NA 140  K 3.7  CL 106  CO2 25  BUN 15  CREATININE 0.89  CALCIUM 9.1  PROT 6.2*  BILITOT 0.6  ALKPHOS 45  ALT 22  AST 18  GLUCOSE 112*    Discharge Medications:  Allergies as of 04/11/2022   No Known Allergies      Medication List     TAKE these medications  acetaminophen 325 MG tablet Commonly known as: TYLENOL Take 2 tablets (650 mg total) by mouth every 4 (four) hours as needed for headache or mild pain.   albuterol 108 (90 Base) MCG/ACT inhaler Commonly known as: VENTOLIN HFA Inhale 2 puffs into the lungs every 6 (six) hours as needed for wheezing or shortness of breath.   aspirin EC 81 MG tablet Take 1 tablet (81 mg total) by mouth daily.   atorvastatin 40 MG tablet Commonly known  as: LIPITOR TAKE HALF TABLET (20MG ) BY MOUTH DAILY. What changed: See the new instructions.   D3-50 1.25 MG (50000 UT) capsule Generic drug: Cholecalciferol Take 50,000 Units by mouth every Sunday.   furosemide 20 MG tablet Commonly known as: Lasix Take 1 tablet (20 mg total) by mouth daily.   lisinopril 20 MG tablet Commonly known as: ZESTRIL Take 1 tablet (20 mg total) by mouth daily.   metoprolol succinate 25 MG 24 hr tablet Commonly known as: TOPROL-XL Take 1 tablet (25 mg total) by mouth daily.   naproxen sodium 220 MG tablet Commonly known as: ALEVE Take 440 mg by mouth every morning.   nicotine 21 mg/24hr patch Commonly known as: NICODERM CQ - dosed in mg/24 hours Place 1 patch (21 mg total) onto the skin daily.   ranolazine 1000 MG SR tablet Commonly known as: RANEXA Take 1 tablet (1,000 mg total) by mouth 2 (two) times daily.        Disposition:    Follow-up Information     [facility] Follow up.   Why: on 7/13 at 2 pm Contact information: [address] [place] [phone]                Duration of Discharge Encounter: Greater than 30 minutes including physician time.  Signed, [phone], PA-C  04/11/2022 8:59 AM  I have seen and examined this patient with 04/13/2022.  Agree with above, note added to reflect my findings.  On exam, RRR, no murmurs, lungs clear.  She is now status post Biotronik pacemaker for sick sinus syndrome.  Device functioning appropriately.  Chest x-ray and interrogation without issue.  Plan for discharge today with follow-up in device clinic.  Ameah Chanda M. Sylva Overley MD 04/11/2022 9:35 AM

## 2022-04-12 ENCOUNTER — Encounter: Payer: Self-pay | Admitting: Cardiology

## 2022-04-14 ENCOUNTER — Other Ambulatory Visit: Payer: Self-pay | Admitting: Cardiology

## 2022-04-15 NOTE — Telephone Encounter (Signed)
Pt reports some improvement. He will keep monitoring and let us know if concerned again. He appreciates my follow up

## 2022-04-18 ENCOUNTER — Telehealth: Payer: Self-pay | Admitting: Cardiology

## 2022-04-18 MED ORDER — FUROSEMIDE 20 MG PO TABS
20.0000 mg | ORAL_TABLET | Freq: Every day | ORAL | 2 refills | Status: DC
Start: 2022-04-18 — End: 2023-02-12

## 2022-04-18 NOTE — Telephone Encounter (Signed)
*  STAT* If patient is at the pharmacy, call can be transferred to refill team.   1. Which medications need to be refilled? (please list name of each medication and dose if known) furosemide (LASIX) 20 MG tablet  2. Which pharmacy/location (including street and city if local pharmacy) is medication to be sent to? [id number] - [place],  - [address]  3. Do they need a 30 day or 90 day supply? 90 day

## 2022-04-18 NOTE — Telephone Encounter (Signed)
This is Dr. Revankar's pt 

## 2022-04-18 NOTE — Telephone Encounter (Signed)
Refill of Furosemide 20 mg sent to [facility], [place].

## 2022-04-25 ENCOUNTER — Ambulatory Visit: Payer: Medicare HMO

## 2022-04-25 DIAGNOSIS — R55 Syncope and collapse: Secondary | ICD-10-CM

## 2022-04-25 NOTE — Patient Instructions (Signed)

## 2022-04-26 NOTE — Progress Notes (Signed)

## 2022-04-29 LAB — CUP PACEART INCLINIC DEVICE CHECK
Date Time Interrogation Session: 20230713131803
Implantable Lead Implant Date: 20230628
Implantable Lead Implant Date: 20230628
Implantable Lead Location: 753859
Implantable Lead Location: 753860
Implantable Lead Model: 377
Implantable Lead Model: 377
Implantable Lead Serial Number: 8000913511
Implantable Lead Serial Number: 8000943292
Implantable Pulse Generator Implant Date: 20230628
Pulse Gen Model: 407145
Pulse Gen Serial Number: 70446212

## 2022-04-30 DIAGNOSIS — R55 Syncope and collapse: Secondary | ICD-10-CM | POA: Diagnosis not present

## 2022-04-30 DIAGNOSIS — Z09 Encounter for follow-up examination after completed treatment for conditions other than malignant neoplasm: Secondary | ICD-10-CM | POA: Diagnosis not present

## 2022-04-30 DIAGNOSIS — Z6841 Body Mass Index (BMI) 40.0 and over, adult: Secondary | ICD-10-CM | POA: Diagnosis not present

## 2022-04-30 DIAGNOSIS — Z950 Presence of cardiac pacemaker: Secondary | ICD-10-CM | POA: Diagnosis not present

## 2022-05-14 DIAGNOSIS — M25561 Pain in right knee: Secondary | ICD-10-CM | POA: Diagnosis not present

## 2022-05-14 DIAGNOSIS — Z6841 Body Mass Index (BMI) 40.0 and over, adult: Secondary | ICD-10-CM | POA: Diagnosis not present

## 2022-07-15 ENCOUNTER — Encounter: Payer: Self-pay | Admitting: Cardiology

## 2022-07-15 ENCOUNTER — Ambulatory Visit: Payer: Medicare HMO | Attending: Cardiology | Admitting: Cardiology

## 2022-07-15 VITALS — BP 164/88 | HR 74 | Ht 71.0 in | Wt 239.0 lb

## 2022-07-15 DIAGNOSIS — I10 Essential (primary) hypertension: Secondary | ICD-10-CM

## 2022-07-15 DIAGNOSIS — I495 Sick sinus syndrome: Secondary | ICD-10-CM | POA: Diagnosis not present

## 2022-07-15 DIAGNOSIS — I471 Supraventricular tachycardia, unspecified: Secondary | ICD-10-CM | POA: Diagnosis not present

## 2022-07-15 LAB — CUP PACEART INCLINIC DEVICE CHECK
Date Time Interrogation Session: 20231002111447
Implantable Lead Implant Date: 20230628
Implantable Lead Implant Date: 20230628
Implantable Lead Location: 753859
Implantable Lead Location: 753860
Implantable Lead Model: 377
Implantable Lead Model: 377
Implantable Lead Serial Number: 8000913511
Implantable Lead Serial Number: 8000943292
Implantable Pulse Generator Implant Date: 20230628
Pulse Gen Model: 407145
Pulse Gen Serial Number: 70446212

## 2022-07-15 NOTE — Patient Instructions (Signed)
Medication Instructions:  Your physician recommends that you continue on your current medications as directed. Please refer to the Current Medication list given to you today.  *If you need a refill on your cardiac medications before your next appointment, please call your pharmacy*   Lab Work: None ordered   Testing/Procedures: None ordered   Follow-Up: At [facility], you and your health needs are our priority.  As part of our continuing mission to provide you with exceptional heart care, we have created designated Provider Care Teams.  These Care Teams include your primary Cardiologist (physician) and Advanced Practice Providers (APPs -  Physician Assistants and Nurse Practitioners) who all work together to provide you with the care you need, when you need it.  Remote monitoring is used to monitor your Pacemaker or ICD from home. This monitoring reduces the number of office visits required to check your device to one time per year. It allows [place] to keep an eye on the functioning of your device to ensure it is working properly. You are scheduled for a device check from home on 10/11/2022. You may send your transmission at any time that day. If you have a wireless device, the transmission will be sent automatically. After your physician reviews your transmission, you will receive a postcard with your next transmission date.  Your next appointment:   1 year(s)  The format for your next appointment:   In Person  Provider:   Allegra Lai, MD    Thank you for choosing [facility]!!   Trinidad Curet, RN [phone]    Other Instructions   Important Information About [place]

## 2022-07-15 NOTE — Progress Notes (Signed)
Electrophysiology Office Note   Date:  07/15/2022   ID:  Blake, Griffin 29-Nov-1955, MRN [id number]  PCP:  Helen Hashimoto., MD  Cardiologist:  Revankar Primary Electrophysiologist:  Loray Akard Meredith Leeds, MD    Chief Complaint: SVT   History of Present Illness: Blake Griffin is a 66 y.o. male who is being seen today for the evaluation of SVT at the request of Helen Hashimoto., MD. Presenting today for electrophysiology evaluation.  The history significant for SVT, near syncope, coronary artery disease, hypertension, tobacco abuse.  He was found to have pauses on his cardiac monitor.  He was sent to the emergency room based on these pauses.  He is having episodes of SVT versus atrial flutter on his monitor.  His metoprolol was decreased.  He had done well, but did have another episode of syncope and is now status post Biotronik dual-chamber pacemaker implanted 04/10/2022.  Today, denies symptoms of palpitations, chest pain, shortness of breath, orthopnea, PND, lower extremity edema, claudication, dizziness, presyncope, syncope, bleeding, or neurologic sequela. The patient is tolerating medications without difficulties.  Pacemaker was implanted he has done well.  He has had no chest pain or shortness of breath.  He is able to all of his daily activities without restriction.  He has had no further episodes of syncope, and no further episodes of SVT.  His blood pressure is elevated today, but he has not yet taken his medications.  He states that when he does take his medications his blood pressure is better controlled.   Past Medical History:  Diagnosis Date   Abnormal nuclear cardiac imaging test    Acute on chronic heart failure with mid-range ejection fraction (HFmEF) ([place]) 01/26/2022   Alcohol use 01/26/2022   Anal fissure    Aortic stenosis    Atrial fibrillation with RVR ([place]) 01/27/2022   Benign essential HTN 08/28/2017   Bilateral carotid bruits    COPD (chronic obstructive  pulmonary disease) (HCC)    Coronary artery disease involving native coronary artery of native heart with angina pectoris (HCC)    Dyslipidemia    ED (erectile dysfunction)    Erectile dysfunction 06/29/2018   Hemorrhoids    Hypertension    Hypogonadism in male    Left bundle branch block (LBBB) 08/06/2018   Leg cramps    Light headed 08/28/2017   Near syncope 01/26/2022   Pain of left shoulder region    Petechiae    Pneumonia 2016   S/P aortic valve replacement with bioprosthetic valve 08/06/2018   25 mm Edwards Intuity Elite stented bovine pericardial bioprosthetic tissue valve  [id number] Model: 8300AB   S/P AVR (aortic valve replacement)  08/06/2018   25 mm Edwards Intuity Elite stented bovine pericardial bioprosthetic tissue valve  [id number] Model: 8300AB   S/P CABG x 1 08/06/2018   SVG to RCA, EVH via right thigh   Smoking    Tachycardia, unspecified 01/28/2022   Tobacco use 01/26/2022   Vitamin D deficiency    Past Surgical History:  Procedure Laterality Date   AORTIC VALVE REPLACEMENT N/A 08/06/2018   Procedure: AORTIC VALVE REPLACEMENT (AVR);  Surgeon: Rexene Alberts, MD;  Location: [facility];  Service: Open Heart Surgery;  Laterality: N/A;   APPENDECTOMY     COLONOSCOPY W/ POLYPECTOMY     x 2   CORONARY ARTERY BYPASS GRAFT N/A 08/06/2018   Procedure: CORONARY ARTERY BYPASS GRAFTING (CABG) times one using right greater saphenous vein harvested endoscopically and aortic valve  replacement.;  Surgeon: Rexene Alberts, MD;  Location: [facility];  Service: Open Heart Surgery;  Laterality: N/A;   CORONARY/GRAFT ANGIOGRAPHY N/A 11/21/2020   Procedure: CORONARY/GRAFT ANGIOGRAPHY;  Surgeon: Troy Sine, MD;  Location: [facility];  Service: Cardiovascular;  Laterality: N/A;   Lung Biospy Right    lower lung   PACEMAKER IMPLANT N/A 04/10/2022   Procedure: PACEMAKER IMPLANT;  Surgeon: Constance Haw, MD;  Location: [facility];  Service: Cardiovascular;  Laterality:  N/A;   RIGHT/LEFT HEART CATH AND CORONARY ANGIOGRAPHY N/A 06/30/2018   Procedure: RIGHT/LEFT HEART CATH AND CORONARY ANGIOGRAPHY;  Surgeon: Belva Crome, MD;  Location: [facility];  Service: Cardiovascular;  Laterality: N/A;   TEE WITHOUT CARDIOVERSION N/A 08/06/2018   Procedure: TRANSESOPHAGEAL ECHOCARDIOGRAM (TEE);  Surgeon: Rexene Alberts, MD;  Location: [facility];  Service: Open Heart Surgery;  Laterality: N/A;   TOOTH EXTRACTION N/A 07/30/2018   Procedure: Extraction of tooth #5 w/ Alveoloplasty and Gross debridement of remaining dentition.;  Surgeon: Lenn Cal, DDS;  Location: [facility];  Service: Oral Surgery;  Laterality: N/A;     Current Outpatient Medications  Medication Sig Dispense Refill   albuterol (PROVENTIL HFA;VENTOLIN HFA) 108 (90 Base) MCG/ACT inhaler Inhale 2 puffs into the lungs every 6 (six) hours as needed for wheezing or shortness of breath.      aspirin EC 81 MG tablet Take 1 tablet (81 mg total) by mouth daily. 90 tablet 3   atorvastatin (LIPITOR) 40 MG tablet TAKE HALF TABLET (20MG ) BY MOUTH DAILY. (Patient taking differently: Take 20 mg by mouth daily.) 45 tablet 3   D3-50 1.25 MG (50000 UT) capsule Take 50,000 Units by mouth every Sunday.     furosemide (LASIX) 20 MG tablet Take 1 tablet (20 mg total) by mouth daily. 90 tablet 2   lisinopril (ZESTRIL) 20 MG tablet Take 1 tablet (20 mg total) by mouth daily. 90 tablet 3   metoprolol succinate (TOPROL-XL) 25 MG 24 hr tablet Take 1 tablet (25 mg total) by mouth daily. 30 tablet 6   naproxen sodium (ALEVE) 220 MG tablet Take 440 mg by mouth every morning.     nicotine (NICODERM CQ - DOSED IN MG/24 HOURS) 21 mg/24hr patch Place 1 patch (21 mg total) onto the skin daily. 28 patch 0   ranolazine (RANEXA) 1000 MG SR tablet Take 1 tablet (1,000 mg total) by mouth 2 (two) times daily. 180 tablet 3   No current facility-administered medications for this visit.    Allergies:   Patient has no known allergies.    Social History:  The patient  reports that he quit smoking about 3 years ago. His smoking use included cigarettes. He has a 50.00 pack-year smoking history. He has never used smokeless tobacco. He reports current alcohol use of about 14.0 standard drinks of alcohol per week. He reports that he does not use drugs.   Family History:  The patient's family history includes Coronary artery disease in his mother; Hypertension in his father and mother.   ROS:  Please see the history of present illness.   Otherwise, review of systems is positive for none.   All other systems are reviewed and negative.   PHYSICAL EXAM: VS:  BP (!) 164/88 (BP Location: Right Arm)   Pulse 74   Ht 5\' 11"  (1.803 m)   Wt 239 lb (108.4 kg)   SpO2 95%   BMI 33.33 kg/m  , BMI Body  mass index is 33.33 kg/m. GEN: Well nourished, well developed, in no acute distress  HEENT: normal  Neck: no JVD, carotid bruits, or masses Cardiac: RRR; no murmurs, rubs, or gallops,no edema  Respiratory:  clear to auscultation bilaterally, normal work of breathing GI: soft, nontender, nondistended, + BS MS: no deformity or atrophy  Skin: warm and dry, device site well healed Neuro:  Strength and sensation are intact Psych: euthymic mood, full affect  EKG:  EKG is ordered today. Personal review of the ekg ordered shows atrial paced, left bundle branch block, rate 74  Personal review of the device interrogation today. Results in [place]: 02/26/2022: Hemoglobin 14.7; Platelets 164 04/10/2022: ALT 22; B Natriuretic Peptide 24.5; BUN 15; Creatinine, Ser 0.89; Magnesium 2.2; Potassium 3.7; Sodium 140; TSH 2.792    Lipid Panel     Component Value Date/Time   CHOL 139 06/29/2018 0938   TRIG 173 (H) 06/29/2018 0938   HDL 38 (L) 06/29/2018 0938   CHOLHDL 3.7 06/29/2018 0938   LDLCALC 66 06/29/2018 0938     Wt Readings from Last 3 Encounters:  07/15/22 239 lb (108.4 kg)  04/11/22 230 lb (104.3 kg)  04/02/22 234 lb  6.4 oz (106.3 kg)      Other studies Reviewed: Additional studies/ records that were reviewed today include: TTE 01/27/22  Review of the above records today demonstrates:   1. Left ventricular ejection fraction, by estimation, is 50 to 55%. The  left ventricle has low normal function. Left ventricular endocardial  border not optimally defined to evaluate regional wall motion. There is  mild concentric left ventricular  hypertrophy. Left ventricular diastolic parameters are consistent with  Grade I diastolic dysfunction (impaired relaxation).   2. Right ventricular systolic function is normal. The right ventricular  size is normal. Tricuspid regurgitation signal is inadequate for assessing  PA pressure.   3. The mitral valve is grossly normal. Trivial mitral valve  regurgitation.   4. The aortic valve has been repaired/replaced. Aortic valve  regurgitation is not visualized. Echo findings are consistent with normal  structure and function of the aortic valve prosthesis. Aortic valve mean  gradient measures 8.0 mmHg.   5. Aortic dilatation noted. There is borderline dilatation of the  ascending aorta, measuring 37 mm.   6. The inferior vena cava is normal in size with greater than 50%  respiratory variability, suggesting right atrial pressure of 3 mmHg.   Cardiac monitor 02/26/2022 personally reviewed  Markedly abnormal event monitor.  Patient has significant pauses lasting the longest at 5.4 seconds.  Patient also had baseline intraventricular conduction delay.  There were episodes of atrial runs also.  In view of this the patient was advised to go to the nearest emergency room.  He was advised not to drive himself but have a family member drive him to the nearest emergency room and he agrees.  I discussed this with my nurse conveyed the report and the findings with the patient.  This was done yesterday.   ASSESSMENT AND PLAN:  1.  SVT: Has had minimal episodes.  Currently on Toprol-XL  25 mg daily.  Continue current management.  2.  Sick sinus syndrome: Status post Biotronik dual-chamber pacemaker implanted 04/11/2022.  Device functioning appropriately.  No changes at this time.  3.  Hypertension: Blood pressure is significantly elevated.  He forgot to take his medications this morning.  He Ainsleigh Kakos check his blood pressures over the next few days and let us know if  it remains elevated.  Current medicines are reviewed at length with the patient today.   The patient does not have concerns regarding his medicines.  The following changes were made today:  none  Labs/ tests ordered today include:  Orders Placed This Encounter  Procedures   CUP [place]   EKG 12-Lead     Disposition:   FU with Diva Lemberger 12 months  Signed, Fern Canova Meredith Leeds, MD  07/15/2022 11:23 AM     [place] [address] [phone] (office) [phone] (fax)

## 2022-07-29 ENCOUNTER — Other Ambulatory Visit: Payer: Self-pay | Admitting: Student

## 2022-07-29 ENCOUNTER — Other Ambulatory Visit: Payer: Self-pay | Admitting: Cardiology

## 2022-08-14 ENCOUNTER — Other Ambulatory Visit: Payer: Self-pay | Admitting: Student

## 2022-09-16 ENCOUNTER — Ambulatory Visit: Payer: Medicare HMO | Admitting: Cardiology

## 2022-09-25 DIAGNOSIS — Z6841 Body Mass Index (BMI) 40.0 and over, adult: Secondary | ICD-10-CM | POA: Diagnosis not present

## 2022-09-25 DIAGNOSIS — I350 Nonrheumatic aortic (valve) stenosis: Secondary | ICD-10-CM | POA: Diagnosis not present

## 2022-09-25 DIAGNOSIS — J449 Chronic obstructive pulmonary disease, unspecified: Secondary | ICD-10-CM | POA: Diagnosis not present

## 2022-09-25 DIAGNOSIS — K429 Umbilical hernia without obstruction or gangrene: Secondary | ICD-10-CM | POA: Diagnosis not present

## 2022-09-25 DIAGNOSIS — I10 Essential (primary) hypertension: Secondary | ICD-10-CM | POA: Diagnosis not present

## 2022-09-25 DIAGNOSIS — M25561 Pain in right knee: Secondary | ICD-10-CM | POA: Diagnosis not present

## 2022-10-11 LAB — CUP PACEART REMOTE DEVICE CHECK
Battery Voltage: 100
Date Time Interrogation Session: 20231224081454
Implantable Lead Connection Status: 753985
Implantable Lead Connection Status: 753985
Implantable Lead Implant Date: 20230628
Implantable Lead Implant Date: 20230628
Implantable Lead Location: 753859
Implantable Lead Location: 753860
Implantable Lead Model: 377
Implantable Lead Model: 377
Implantable Lead Serial Number: 8000913511
Implantable Lead Serial Number: 8000943292
Implantable Pulse Generator Implant Date: 20230628
Pulse Gen Model: 407145
Pulse Gen Serial Number: 70446212

## 2022-12-17 ENCOUNTER — Ambulatory Visit: Payer: Medicare HMO | Attending: Cardiology | Admitting: Cardiology

## 2022-12-17 ENCOUNTER — Encounter: Payer: Self-pay | Admitting: Cardiology

## 2022-12-17 VITALS — BP 148/86 | HR 78 | Ht 71.0 in | Wt 246.2 lb

## 2022-12-17 DIAGNOSIS — Z951 Presence of aortocoronary bypass graft: Secondary | ICD-10-CM

## 2022-12-17 DIAGNOSIS — I25119 Atherosclerotic heart disease of native coronary artery with unspecified angina pectoris: Secondary | ICD-10-CM | POA: Diagnosis not present

## 2022-12-17 DIAGNOSIS — Z131 Encounter for screening for diabetes mellitus: Secondary | ICD-10-CM | POA: Diagnosis not present

## 2022-12-17 DIAGNOSIS — Z953 Presence of xenogenic heart valve: Secondary | ICD-10-CM | POA: Diagnosis not present

## 2022-12-17 DIAGNOSIS — E785 Hyperlipidemia, unspecified: Secondary | ICD-10-CM | POA: Diagnosis not present

## 2022-12-17 DIAGNOSIS — E559 Vitamin D deficiency, unspecified: Secondary | ICD-10-CM | POA: Diagnosis not present

## 2022-12-17 NOTE — Progress Notes (Signed)
Cardiology Office Note:    Date:  12/17/2022   ID:  Blake Griffin, DOB [birth year], MRN [id number]  PCP:  Helen Hashimoto., MD  Cardiologist:  Jenean Lindau, MD   Referring MD: Helen Hashimoto., MD    ASSESSMENT:    1. Coronary artery disease involving native coronary artery of native heart with angina pectoris ([place])   2. S/P aortic valve replacement with bioprosthetic valve   3. S/P CABG x 1   4. Dyslipidemia    PLAN:    In order of problems listed above:  Coronary artery disease: Secondary prevention stressed with the patient.  Importance of compliance with diet medication stressed and he vocalized understanding.  He was advised to walk at least half an hour a day 5 days a week and he promises to do so. Essential hypertension: Blood pressure stable and diet was emphasized.  Salt intake and diet issue were discussed to satisfaction.  Lifestyle modification urged. Mixed dyslipidemia: He will come back in the next few days for blood work.  He has not had that done for some time will advise him accordingly. Post aortic valve replacement with a bioprosthetic valve: Stable at this time.  Echo report reviewed. Obesity: Weight reduction stressed and he promises to do better.  Diet emphasized.  Risks of obesity revisited. Patient will be seen in follow-up appointment in 6 months or earlier if the patient has any concerns    Medication Adjustments/Labs and Tests Ordered: Current medicines are reviewed at length with the patient today.  Concerns regarding medicines are outlined above.  No orders of the defined types were placed in this encounter.  No orders of the defined types were placed in this encounter.    No chief complaint on file.    History of Present Illness:    CHAPPELL SARTINI is a 67 y.o. male.  Patient has past medical history of coronary artery disease post CABG surgery and aortic valve replacement.  He denies any problems at this time.  No chest pain orthopnea  or PND.  He has history of essential hypertension dyslipidemia and obesity.  He leads a sedentary lifestyle.  At the time of my evaluation, the patient is alert awake oriented and in no distress.  Past Medical History:  Diagnosis Date   Abnormal nuclear cardiac imaging test    Acute on chronic heart failure with mid-range ejection fraction (HFmEF) ([place]) 01/26/2022   Alcohol use 01/26/2022   Anal fissure    Aortic stenosis    Atrial fibrillation with RVR ([place]) 01/27/2022   Benign essential HTN 08/28/2017   Bilateral carotid bruits    COPD (chronic obstructive pulmonary disease) (HCC)    Coronary artery disease involving native coronary artery of native heart with angina pectoris (HCC)    Dyslipidemia    ED (erectile dysfunction)    Erectile dysfunction 06/29/2018   Hemorrhoids    Hypertension    Hypogonadism in male    Left bundle branch block (LBBB) 08/06/2018   Leg cramps    Light headed 08/28/2017   Near syncope 01/26/2022   Nonrheumatic aortic valve stenosis 08/28/2017   Pain of left shoulder region    Petechiae    Pneumonia 2016   S/P aortic valve replacement with bioprosthetic valve 08/06/2018   25 mm Edwards Intuity Elite stented bovine pericardial bioprosthetic tissue valve  [id number] Model: 8300AB   S/P AVR (aortic valve replacement)  08/06/2018   25 mm Edwards Intuity Elite stented bovine pericardial bioprosthetic tissue  valve  [id number] Model: 8300AB   S/P CABG x 1 08/06/2018   SVG to RCA, EVH via right thigh   Smoking    Syncope 04/09/2022   Tachycardia, unspecified 01/28/2022   Tobacco use 01/26/2022   Vitamin D deficiency     Past Surgical History:  Procedure Laterality Date   AORTIC VALVE REPLACEMENT N/A 08/06/2018   Procedure: AORTIC VALVE REPLACEMENT (AVR);  Surgeon: Rexene Alberts, MD;  Location: [facility];  Service: Open Heart Surgery;  Laterality: N/A;   APPENDECTOMY     COLONOSCOPY W/ POLYPECTOMY     x 2   CORONARY ARTERY BYPASS GRAFT N/A 08/06/2018    Procedure: CORONARY ARTERY BYPASS GRAFTING (CABG) times one using right greater saphenous vein harvested endoscopically and aortic valve replacement.;  Surgeon: Rexene Alberts, MD;  Location: [facility];  Service: Open Heart Surgery;  Laterality: N/A;   CORONARY/GRAFT ANGIOGRAPHY N/A 11/21/2020   Procedure: CORONARY/GRAFT ANGIOGRAPHY;  Surgeon: Troy Sine, MD;  Location: [facility];  Service: Cardiovascular;  Laterality: N/A;   Lung Biospy Right    lower lung   PACEMAKER IMPLANT N/A 04/10/2022   Procedure: PACEMAKER IMPLANT;  Surgeon: Constance Haw, MD;  Location: [facility];  Service: Cardiovascular;  Laterality: N/A;   RIGHT/LEFT HEART CATH AND CORONARY ANGIOGRAPHY N/A 06/30/2018   Procedure: RIGHT/LEFT HEART CATH AND CORONARY ANGIOGRAPHY;  Surgeon: Belva Crome, MD;  Location: [facility];  Service: Cardiovascular;  Laterality: N/A;   TEE WITHOUT CARDIOVERSION N/A 08/06/2018   Procedure: TRANSESOPHAGEAL ECHOCARDIOGRAM (TEE);  Surgeon: Rexene Alberts, MD;  Location: [facility];  Service: Open Heart Surgery;  Laterality: N/A;   TOOTH EXTRACTION N/A 07/30/2018   Procedure: Extraction of tooth #5 w/ Alveoloplasty and Gross debridement of remaining dentition.;  Surgeon: Lenn Cal, DDS;  Location: [facility];  Service: Oral Surgery;  Laterality: N/A;    Current Medications: Current Meds  Medication Sig   albuterol (PROVENTIL HFA;VENTOLIN HFA) 108 (90 Base) MCG/ACT inhaler Inhale 2 puffs into the lungs every 6 (six) hours as needed for wheezing or shortness of breath.    aspirin EC 81 MG tablet Take 1 tablet (81 mg total) by mouth daily.   atorvastatin (LIPITOR) 40 MG tablet TAKE HALF TABLET ('20MG'$ ) BY MOUTH DAILY.   D3-50 1.25 MG (50000 UT) capsule Take 50,000 Units by mouth every Sunday.   furosemide (LASIX) 20 MG tablet Take 1 tablet (20 mg total) by mouth daily.   lisinopril (ZESTRIL) 20 MG tablet Take 1 tablet (20 mg total) by mouth daily.   metoprolol  succinate (TOPROL-XL) 25 MG 24 hr tablet TAKE 1 TABLET (25 MG TOTAL) BY MOUTH DAILY.   naproxen sodium (ALEVE) 220 MG tablet Take 440 mg by mouth every morning.   nicotine (NICODERM CQ - DOSED IN MG/24 HOURS) 21 mg/24hr patch Place 1 patch (21 mg total) onto the skin daily.   ranolazine (RANEXA) 1000 MG SR tablet Take 1 tablet (1,000 mg total) by mouth 2 (two) times daily.     Allergies:   Patient has no known allergies.   Social History   Socioeconomic History   Marital status: Married    Spouse name: Not on file   Number of children: 5   Years of education: Not on file   Highest education level: Not on file  Occupational History   Not on file  Tobacco Use   Smoking status: Former    Packs/day: 1.00    Years:  50.00    Total pack years: 50.00    Types: Cigarettes    Quit date: 08/06/2018    Years since quitting: 4.3   Smokeless tobacco: Never  Vaping Use   Vaping Use: Never used  Substance and Sexual Activity   Alcohol use: Yes    Alcohol/week: 14.0 standard drinks of alcohol    Types: 14 Shots of liquor per week    Comment: Bacardi rum daily   Drug use: Never   Sexual activity: Not on file  Other Topics Concern   Not on file  Social History Narrative   Not on file   Social Determinants of Health   Financial Resource Strain: Not on file  Food Insecurity: Not on file  Transportation Needs: Not on file  Physical Activity: Not on file  Stress: Not on file  Social Connections: Not on file     Family History: The patient's family history includes Coronary artery disease in his mother; Hypertension in his father and mother.  ROS:   Please see the history of present illness.    All other systems reviewed and are negative.  EKGs/Labs/Other Studies Reviewed:    The following studies were reviewed today: I discussed my findings with the patient at length.   Recent Labs: 02/26/2022: Hemoglobin 14.7; Platelets 164 04/10/2022: ALT 22; B Natriuretic Peptide 24.5; BUN  15; Creatinine, Ser 0.89; Magnesium 2.2; Potassium 3.7; Sodium 140; TSH 2.792  Recent Lipid Panel    Component Value Date/Time   CHOL 139 06/29/2018 0938   TRIG 173 (H) 06/29/2018 0938   HDL 38 (L) 06/29/2018 0938   CHOLHDL 3.7 06/29/2018 0938   LDLCALC 66 06/29/2018 0938    Physical Exam:    VS:  BP (!) 148/86   Pulse 78   Ht '5\' 11"'$  (1.803 m)   Wt 246 lb 3.2 oz (111.7 kg)   SpO2 92%   BMI 34.34 kg/m     Wt Readings from Last 3 Encounters:  12/17/22 246 lb 3.2 oz (111.7 kg)  07/15/22 239 lb (108.4 kg)  04/11/22 230 lb (104.3 kg)     GEN: Patient is in no acute distress HEENT: Normal NECK: No JVD; No carotid bruits LYMPHATICS: No lymphadenopathy CARDIAC: Hear sounds regular, 2/6 systolic murmur at the apex. RESPIRATORY:  Clear to auscultation without rales, wheezing or rhonchi  ABDOMEN: Soft, non-tender, non-distended MUSCULOSKELETAL:  No edema; No deformity  SKIN: Warm and dry NEUROLOGIC:  Alert and oriented x 3 PSYCHIATRIC:  Normal affect   Signed, Jenean Lindau, MD  12/17/2022 11:42 AM    [place]

## 2022-12-17 NOTE — Patient Instructions (Addendum)
Medication Instructions:  Your physician recommends that you continue on your current medications as directed. Please refer to the Current Medication list given to you today.  *If you need a refill on your cardiac medications before your next appointment, please call your pharmacy*   Lab Work: Your physician recommends that you return for lab work in: the next few days You need to have labs done when you are fasting.  You can come Monday through Friday 8:30 am to 12:00 pm and 1:15 to 4:30. You do not need to make an appointment as the order has already been placed. The labs you are going to have done are CMP, CBC, TSH, A1C, vitamin Dand Lipids.  If you have labs (blood work) drawn today and your tests are completely normal, you will receive your results only by: [place] (if you have MyChart) OR A paper copy in the mail If you have any lab test that is abnormal or we need to change your treatment, we will call you to review the results.   Testing/Procedures: None ordered   Follow-Up: At [facility], you and your health needs are our priority.  As part of our continuing mission to provide you with exceptional heart care, we have created designated Provider Care Teams.  These Care Teams include your primary Cardiologist (physician) and Advanced Practice Providers (APPs -  Physician Assistants and Nurse Practitioners) who all work together to provide you with the care you need, when you need it.  We recommend signing up for the patient portal called "MyChart".  Sign up information is provided on this After Visit Summary.  MyChart is used to connect with patients for Virtual Visits (Telemedicine).  Patients are able to view lab/test results, encounter notes, upcoming appointments, etc.  Non-urgent messages can be sent to your provider as well.   To learn more about what you can do with MyChart, go to [email or web address].    Your next appointment:   9 month(s)  The format  for your next appointment:   In Person  Provider:   Jyl Heinz, MD    Other Instructions none  Important Information About Sugar

## 2022-12-24 DIAGNOSIS — E785 Hyperlipidemia, unspecified: Secondary | ICD-10-CM | POA: Diagnosis not present

## 2022-12-24 DIAGNOSIS — Z951 Presence of aortocoronary bypass graft: Secondary | ICD-10-CM | POA: Diagnosis not present

## 2022-12-24 DIAGNOSIS — I25119 Atherosclerotic heart disease of native coronary artery with unspecified angina pectoris: Secondary | ICD-10-CM | POA: Diagnosis not present

## 2022-12-24 DIAGNOSIS — Z131 Encounter for screening for diabetes mellitus: Secondary | ICD-10-CM | POA: Diagnosis not present

## 2022-12-24 DIAGNOSIS — E559 Vitamin D deficiency, unspecified: Secondary | ICD-10-CM | POA: Diagnosis not present

## 2022-12-25 LAB — CBC
Hematocrit: 43.9 % (ref 37.5–51.0)
Hemoglobin: 14.6 g/dL (ref 13.0–17.7)
MCH: 32.7 pg (ref 26.6–33.0)
MCHC: 33.3 g/dL (ref 31.5–35.7)
MCV: 98 fL — ABNORMAL HIGH (ref 79–97)
Platelets: 189 10*3/uL (ref 150–450)
RBC: 4.47 x10E6/uL (ref 4.14–5.80)
RDW: 12.8 % (ref 11.6–15.4)
WBC: 7.5 10*3/uL (ref 3.4–10.8)

## 2022-12-25 LAB — COMPREHENSIVE METABOLIC PANEL
ALT: 17 IU/L (ref 0–44)
AST: 14 IU/L (ref 0–40)
Albumin/Globulin Ratio: 2.4 — ABNORMAL HIGH (ref 1.2–2.2)
Albumin: 4 g/dL (ref 3.9–4.9)
Alkaline Phosphatase: 61 IU/L (ref 44–121)
BUN/Creatinine Ratio: 18 (ref 10–24)
BUN: 16 mg/dL (ref 8–27)
Bilirubin Total: 0.3 mg/dL (ref 0.0–1.2)
CO2: 25 mmol/L (ref 20–29)
Calcium: 9 mg/dL (ref 8.6–10.2)
Chloride: 105 mmol/L (ref 96–106)
Creatinine, Ser: 0.88 mg/dL (ref 0.76–1.27)
Globulin, Total: 1.7 g/dL (ref 1.5–4.5)
Glucose: 124 mg/dL — ABNORMAL HIGH (ref 70–99)
Potassium: 4.3 mmol/L (ref 3.5–5.2)
Sodium: 141 mmol/L (ref 134–144)
Total Protein: 5.7 g/dL — ABNORMAL LOW (ref 6.0–8.5)
eGFR: 93 mL/min/{1.73_m2} (ref >59)

## 2022-12-25 LAB — HEMOGLOBIN A1C
Est. average glucose Bld gHb Est-mCnc: 140 mg/dL
Hgb A1c MFr Bld: 6.5 % — ABNORMAL HIGH (ref 4.8–5.6)

## 2022-12-25 LAB — LIPID PANEL
Chol/HDL Ratio: 2.8 ratio (ref 0.0–5.0)
Cholesterol, Total: 113 mg/dL (ref 100–199)
HDL: 41 mg/dL (ref >39)
LDL Chol Calc (NIH): 53 mg/dL (ref 0–99)
Triglycerides: 102 mg/dL (ref 0–149)
VLDL Cholesterol Cal: 19 mg/dL (ref 5–40)

## 2022-12-25 LAB — VITAMIN D 25 HYDROXY (VIT D DEFICIENCY, FRACTURES): Vit D, 25-Hydroxy: 49.9 ng/mL (ref 30.0–100.0)

## 2022-12-25 LAB — TSH: TSH: 1.24 u[IU]/mL (ref 0.450–4.500)

## 2023-01-10 ENCOUNTER — Ambulatory Visit (INDEPENDENT_AMBULATORY_CARE_PROVIDER_SITE_OTHER): Payer: Medicare HMO

## 2023-01-10 DIAGNOSIS — I495 Sick sinus syndrome: Secondary | ICD-10-CM

## 2023-01-12 LAB — CUP PACEART REMOTE DEVICE CHECK
Date Time Interrogation Session: 20240328193245
Implantable Lead Connection Status: 753985
Implantable Lead Connection Status: 753985
Implantable Lead Implant Date: 20230628
Implantable Lead Implant Date: 20230628
Implantable Lead Location: 753859
Implantable Lead Location: 753860
Implantable Lead Model: 377
Implantable Lead Model: 377
Implantable Lead Serial Number: 8000913511
Implantable Lead Serial Number: 8000943292
Implantable Pulse Generator Implant Date: 20230628
Pulse Gen Model: 407145
Pulse Gen Serial Number: 70446212

## 2023-01-28 DIAGNOSIS — I350 Nonrheumatic aortic (valve) stenosis: Secondary | ICD-10-CM | POA: Diagnosis not present

## 2023-01-28 DIAGNOSIS — M7989 Other specified soft tissue disorders: Secondary | ICD-10-CM | POA: Diagnosis not present

## 2023-01-28 DIAGNOSIS — K429 Umbilical hernia without obstruction or gangrene: Secondary | ICD-10-CM | POA: Diagnosis not present

## 2023-01-28 DIAGNOSIS — E785 Hyperlipidemia, unspecified: Secondary | ICD-10-CM | POA: Diagnosis not present

## 2023-01-28 DIAGNOSIS — J449 Chronic obstructive pulmonary disease, unspecified: Secondary | ICD-10-CM | POA: Diagnosis not present

## 2023-01-28 DIAGNOSIS — I10 Essential (primary) hypertension: Secondary | ICD-10-CM | POA: Diagnosis not present

## 2023-01-28 DIAGNOSIS — Z6841 Body Mass Index (BMI) 40.0 and over, adult: Secondary | ICD-10-CM | POA: Diagnosis not present

## 2023-02-06 DIAGNOSIS — I739 Peripheral vascular disease, unspecified: Secondary | ICD-10-CM | POA: Diagnosis not present

## 2023-02-06 DIAGNOSIS — Z1211 Encounter for screening for malignant neoplasm of colon: Secondary | ICD-10-CM | POA: Diagnosis not present

## 2023-02-06 DIAGNOSIS — Z6841 Body Mass Index (BMI) 40.0 and over, adult: Secondary | ICD-10-CM | POA: Diagnosis not present

## 2023-02-12 ENCOUNTER — Other Ambulatory Visit: Payer: Self-pay | Admitting: Cardiology

## 2023-02-12 ENCOUNTER — Telehealth: Payer: Self-pay | Admitting: Cardiology

## 2023-02-12 MED ORDER — FUROSEMIDE 20 MG PO TABS
20.0000 mg | ORAL_TABLET | Freq: Every day | ORAL | 2 refills | Status: DC
Start: 2023-02-12 — End: 2023-09-17

## 2023-02-12 NOTE — Telephone Encounter (Signed)
*  STAT* If patient is at the pharmacy, call can be transferred to refill team.   1. Which medications need to be refilled? (please list name of each medication and dose if known) furosemide (LASIX) 20 MG tablet (Expired)   2. Which pharmacy/location (including street and city if local pharmacy) is medication to be sent to? [id number] - [place], [place] - [address]   3. Do they need a 30 day or 90 day supply? 90  

## 2023-02-12 NOTE — Telephone Encounter (Addendum)
Some one else has already sent this

## 2023-02-12 NOTE — Progress Notes (Signed)
Remote pacemaker transmission.   

## 2023-02-26 ENCOUNTER — Other Ambulatory Visit: Payer: Self-pay | Admitting: Cardiology

## 2023-03-26 DIAGNOSIS — L03115 Cellulitis of right lower limb: Secondary | ICD-10-CM | POA: Diagnosis not present

## 2023-03-26 DIAGNOSIS — R739 Hyperglycemia, unspecified: Secondary | ICD-10-CM | POA: Diagnosis not present

## 2023-03-26 DIAGNOSIS — Z79899 Other long term (current) drug therapy: Secondary | ICD-10-CM | POA: Diagnosis not present

## 2023-03-26 DIAGNOSIS — Z6841 Body Mass Index (BMI) 40.0 and over, adult: Secondary | ICD-10-CM | POA: Diagnosis not present

## 2023-03-26 DIAGNOSIS — I739 Peripheral vascular disease, unspecified: Secondary | ICD-10-CM | POA: Diagnosis not present

## 2023-03-26 DIAGNOSIS — I509 Heart failure, unspecified: Secondary | ICD-10-CM | POA: Diagnosis not present

## 2023-03-26 DIAGNOSIS — I498 Other specified cardiac arrhythmias: Secondary | ICD-10-CM | POA: Diagnosis not present

## 2023-04-11 ENCOUNTER — Ambulatory Visit (INDEPENDENT_AMBULATORY_CARE_PROVIDER_SITE_OTHER): Payer: Medicare HMO

## 2023-04-11 DIAGNOSIS — I495 Sick sinus syndrome: Secondary | ICD-10-CM | POA: Diagnosis not present

## 2023-04-11 LAB — CUP PACEART REMOTE DEVICE CHECK
Battery Voltage: 90
Date Time Interrogation Session: 20240628131624
Implantable Lead Connection Status: 753985
Implantable Lead Connection Status: 753985
Implantable Lead Implant Date: 20230628
Implantable Lead Implant Date: 20230628
Implantable Lead Location: 753859
Implantable Lead Location: 753860
Implantable Lead Model: 377
Implantable Lead Model: 377
Implantable Lead Serial Number: 8000913511
Implantable Lead Serial Number: 8000943292
Implantable Pulse Generator Implant Date: 20230628
Pulse Gen Model: 407145
Pulse Gen Serial Number: 70446212

## 2023-04-16 ENCOUNTER — Other Ambulatory Visit: Payer: Self-pay | Admitting: Cardiology

## 2023-04-24 NOTE — Progress Notes (Signed)
Remote pacemaker transmission.   

## 2023-05-01 DIAGNOSIS — Z9181 History of falling: Secondary | ICD-10-CM | POA: Diagnosis not present

## 2023-05-01 DIAGNOSIS — Z0000 Encounter for general adult medical examination without abnormal findings: Secondary | ICD-10-CM | POA: Diagnosis not present

## 2023-05-05 DIAGNOSIS — K5900 Constipation, unspecified: Secondary | ICD-10-CM | POA: Diagnosis not present

## 2023-05-05 DIAGNOSIS — Z800 Family history of malignant neoplasm of digestive organs: Secondary | ICD-10-CM | POA: Diagnosis not present

## 2023-06-10 DIAGNOSIS — E785 Hyperlipidemia, unspecified: Secondary | ICD-10-CM | POA: Diagnosis not present

## 2023-06-10 DIAGNOSIS — I350 Nonrheumatic aortic (valve) stenosis: Secondary | ICD-10-CM | POA: Diagnosis not present

## 2023-06-10 DIAGNOSIS — Z6833 Body mass index (BMI) 33.0-33.9, adult: Secondary | ICD-10-CM | POA: Diagnosis not present

## 2023-06-10 DIAGNOSIS — E1165 Type 2 diabetes mellitus with hyperglycemia: Secondary | ICD-10-CM | POA: Diagnosis not present

## 2023-07-11 ENCOUNTER — Ambulatory Visit: Payer: Medicare HMO

## 2023-07-11 DIAGNOSIS — I495 Sick sinus syndrome: Secondary | ICD-10-CM

## 2023-07-12 LAB — CUP PACEART REMOTE DEVICE CHECK
Date Time Interrogation Session: 20240927092536
Implantable Lead Connection Status: 753985
Implantable Lead Connection Status: 753985
Implantable Lead Implant Date: 20230628
Implantable Lead Implant Date: 20230628
Implantable Lead Location: 753859
Implantable Lead Location: 753860
Implantable Lead Model: 377
Implantable Lead Model: 377
Implantable Lead Serial Number: 8000913511
Implantable Lead Serial Number: 8000943292
Implantable Pulse Generator Implant Date: 20230628
Pulse Gen Model: 407145
Pulse Gen Serial Number: 70446212

## 2023-07-14 ENCOUNTER — Other Ambulatory Visit: Payer: Self-pay | Admitting: Cardiology

## 2023-07-17 NOTE — Progress Notes (Signed)
Remote pacemaker transmission.   

## 2023-07-23 DIAGNOSIS — K648 Other hemorrhoids: Secondary | ICD-10-CM | POA: Diagnosis not present

## 2023-07-23 DIAGNOSIS — D126 Benign neoplasm of colon, unspecified: Secondary | ICD-10-CM | POA: Diagnosis not present

## 2023-07-23 DIAGNOSIS — Z1211 Encounter for screening for malignant neoplasm of colon: Secondary | ICD-10-CM | POA: Diagnosis not present

## 2023-07-23 DIAGNOSIS — D122 Benign neoplasm of ascending colon: Secondary | ICD-10-CM | POA: Diagnosis not present

## 2023-07-23 DIAGNOSIS — Z950 Presence of cardiac pacemaker: Secondary | ICD-10-CM | POA: Diagnosis not present

## 2023-07-23 DIAGNOSIS — M5412 Radiculopathy, cervical region: Secondary | ICD-10-CM | POA: Diagnosis not present

## 2023-07-23 DIAGNOSIS — Z860109 Personal history of other colon polyps: Secondary | ICD-10-CM | POA: Diagnosis not present

## 2023-07-23 DIAGNOSIS — I10 Essential (primary) hypertension: Secondary | ICD-10-CM | POA: Diagnosis not present

## 2023-07-23 DIAGNOSIS — K5750 Diverticulosis of both small and large intestine without perforation or abscess without bleeding: Secondary | ICD-10-CM | POA: Diagnosis not present

## 2023-07-23 DIAGNOSIS — K644 Residual hemorrhoidal skin tags: Secondary | ICD-10-CM | POA: Diagnosis not present

## 2023-07-23 DIAGNOSIS — K5730 Diverticulosis of large intestine without perforation or abscess without bleeding: Secondary | ICD-10-CM | POA: Diagnosis not present

## 2023-07-23 DIAGNOSIS — Z860100 Personal history of colon polyps, unspecified: Secondary | ICD-10-CM | POA: Diagnosis not present

## 2023-07-23 DIAGNOSIS — Z83719 Family history of colon polyps, unspecified: Secondary | ICD-10-CM | POA: Diagnosis not present

## 2023-07-23 DIAGNOSIS — K635 Polyp of colon: Secondary | ICD-10-CM | POA: Diagnosis not present

## 2023-07-23 DIAGNOSIS — Z79899 Other long term (current) drug therapy: Secondary | ICD-10-CM | POA: Diagnosis not present

## 2023-08-02 ENCOUNTER — Other Ambulatory Visit: Payer: Self-pay | Admitting: Cardiology

## 2023-08-07 DIAGNOSIS — M65352 Trigger finger, left little finger: Secondary | ICD-10-CM | POA: Diagnosis not present

## 2023-08-07 DIAGNOSIS — M5412 Radiculopathy, cervical region: Secondary | ICD-10-CM | POA: Diagnosis not present

## 2023-09-10 DIAGNOSIS — Z6831 Body mass index (BMI) 31.0-31.9, adult: Secondary | ICD-10-CM | POA: Diagnosis not present

## 2023-09-10 DIAGNOSIS — E1165 Type 2 diabetes mellitus with hyperglycemia: Secondary | ICD-10-CM | POA: Diagnosis not present

## 2023-09-10 DIAGNOSIS — E785 Hyperlipidemia, unspecified: Secondary | ICD-10-CM | POA: Diagnosis not present

## 2023-09-10 DIAGNOSIS — J449 Chronic obstructive pulmonary disease, unspecified: Secondary | ICD-10-CM | POA: Diagnosis not present

## 2023-09-15 DIAGNOSIS — G5623 Lesion of ulnar nerve, bilateral upper limbs: Secondary | ICD-10-CM

## 2023-09-15 DIAGNOSIS — M5412 Radiculopathy, cervical region: Secondary | ICD-10-CM

## 2023-09-15 DIAGNOSIS — E119 Type 2 diabetes mellitus without complications: Secondary | ICD-10-CM | POA: Insufficient documentation

## 2023-09-15 DIAGNOSIS — G5603 Carpal tunnel syndrome, bilateral upper limbs: Secondary | ICD-10-CM | POA: Insufficient documentation

## 2023-09-15 HISTORY — DX: Type 2 diabetes mellitus without complications: E11.9

## 2023-09-15 HISTORY — DX: Carpal tunnel syndrome, bilateral upper limbs: G56.03

## 2023-09-15 HISTORY — DX: Radiculopathy, cervical region: M54.12

## 2023-09-15 HISTORY — DX: Lesion of ulnar nerve, bilateral upper limbs: G56.23

## 2023-09-17 ENCOUNTER — Other Ambulatory Visit: Payer: Self-pay

## 2023-09-18 ENCOUNTER — Ambulatory Visit: Payer: Medicare HMO | Admitting: Cardiology

## 2023-09-19 ENCOUNTER — Telehealth: Payer: Self-pay | Admitting: Cardiology

## 2023-09-19 DIAGNOSIS — G5620 Lesion of ulnar nerve, unspecified upper limb: Secondary | ICD-10-CM | POA: Diagnosis not present

## 2023-09-19 DIAGNOSIS — M47812 Spondylosis without myelopathy or radiculopathy, cervical region: Secondary | ICD-10-CM | POA: Diagnosis not present

## 2023-09-19 DIAGNOSIS — M5412 Radiculopathy, cervical region: Secondary | ICD-10-CM | POA: Diagnosis not present

## 2023-09-19 NOTE — Telephone Encounter (Signed)
Pt states he is having a surgery sooner and they are telling him he needs a new ID card for his pacemaker. He states his currently says temporary. Please advise.

## 2023-09-23 DIAGNOSIS — D72829 Elevated white blood cell count, unspecified: Secondary | ICD-10-CM | POA: Diagnosis not present

## 2023-10-01 DIAGNOSIS — Z950 Presence of cardiac pacemaker: Secondary | ICD-10-CM | POA: Diagnosis not present

## 2023-10-01 DIAGNOSIS — M5412 Radiculopathy, cervical region: Secondary | ICD-10-CM | POA: Diagnosis not present

## 2023-10-02 DIAGNOSIS — M5412 Radiculopathy, cervical region: Secondary | ICD-10-CM | POA: Diagnosis not present

## 2023-10-10 ENCOUNTER — Ambulatory Visit (INDEPENDENT_AMBULATORY_CARE_PROVIDER_SITE_OTHER): Payer: Medicare HMO

## 2023-10-10 DIAGNOSIS — I495 Sick sinus syndrome: Secondary | ICD-10-CM | POA: Diagnosis not present

## 2023-10-11 LAB — CUP PACEART REMOTE DEVICE CHECK
Date Time Interrogation Session: 20241227064356
Implantable Lead Connection Status: 753985
Implantable Lead Connection Status: 753985
Implantable Lead Implant Date: 20230628
Implantable Lead Implant Date: 20230628
Implantable Lead Location: 753859
Implantable Lead Location: 753860
Implantable Lead Model: 377
Implantable Lead Model: 377
Implantable Lead Serial Number: 8000913511
Implantable Lead Serial Number: 8000943292
Implantable Pulse Generator Implant Date: 20230628
Pulse Gen Model: 407145
Pulse Gen Serial Number: 70446212

## 2023-10-14 DIAGNOSIS — G5620 Lesion of ulnar nerve, unspecified upper limb: Secondary | ICD-10-CM | POA: Diagnosis not present

## 2023-10-14 DIAGNOSIS — M5412 Radiculopathy, cervical region: Secondary | ICD-10-CM | POA: Diagnosis not present

## 2023-10-14 DIAGNOSIS — G5600 Carpal tunnel syndrome, unspecified upper limb: Secondary | ICD-10-CM | POA: Diagnosis not present

## 2023-10-27 ENCOUNTER — Encounter: Payer: Self-pay | Admitting: Cardiology

## 2023-10-27 ENCOUNTER — Ambulatory Visit: Payer: Medicare HMO | Attending: Cardiology | Admitting: Cardiology

## 2023-10-27 VITALS — BP 124/72 | HR 67 | Ht 71.0 in | Wt 237.4 lb

## 2023-10-27 DIAGNOSIS — I25119 Atherosclerotic heart disease of native coronary artery with unspecified angina pectoris: Secondary | ICD-10-CM | POA: Diagnosis not present

## 2023-10-27 DIAGNOSIS — I10 Essential (primary) hypertension: Secondary | ICD-10-CM

## 2023-10-27 DIAGNOSIS — E785 Hyperlipidemia, unspecified: Secondary | ICD-10-CM

## 2023-10-27 DIAGNOSIS — Z953 Presence of xenogenic heart valve: Secondary | ICD-10-CM | POA: Diagnosis not present

## 2023-10-27 NOTE — Patient Instructions (Signed)

## 2023-10-27 NOTE — Progress Notes (Signed)
 Cardiology Office Note:    Date:  10/27/2023   ID:  HAYZE GAZDA, DOB 1956/01/07, MRN [id number]  PCP:  Elaine Garnette BIRCH., MD  Cardiologist:  Jennifer JONELLE Crape, MD   Referring MD: Elaine Garnette BIRCH., MD    ASSESSMENT:    1. Coronary artery disease involving native coronary artery of native heart with angina pectoris (HCC)   2. Primary hypertension   3. S/P aortic valve replacement with bioprosthetic valve   4. Dyslipidemia    PLAN:    In order of problems listed above:  Coronary artery disease: Secondary prevention stressed with the patient.  Importance of compliance with diet medication stressed and he vocalized understanding.  He was advised to walk at least half an hour a day on a daily basis. Essential hypertension: Blood pressure stable and diet was emphasized. Permanent pacemaker: Stable followed by electrophysiology colleagues.  Their notes were reviewed. Cigarette smoker: I spent 5 minutes with the patient discussing solely about smoking. Smoking cessation was counseled. I suggested to the patient also different medications and pharmacological interventions. Patient is keen to try stopping on its own at this time. He will get back to me if he needs any further assistance in this matter. Obesity: Weight reduction stressed diet was emphasized.  Risks of obesity explained. Mixed lipidemia: On lipid-lowering medications followed by primary care.  Lipids were reviewed and discussed with the patient.  LDL is at goal. Patient will be seen in follow-up appointment in 9 months or earlier if the patient has any concerns.    Medication Adjustments/Labs and Tests Ordered: Current medicines are reviewed at length with the patient today.  Concerns regarding medicines are outlined above.  Orders Placed This Encounter  Procedures   EKG 12-Lead   No orders of the defined types were placed in this encounter.    No chief complaint on file.    History of Present Illness:     Blake Griffin is a 68 y.o. male.  Patient has past medical history of coronary artery disease, post aortic valve replacement, essential hypertension, mixed dyslipidemia, obesity and unfortunately continues to smoke.  He denies any problems at this time and takes care of activities of daily living.  No chest pain orthopnea or PND.  At the time of my evaluation, the patient is alert awake oriented and in no distress.  Past Medical History:  Diagnosis Date   Abnormal nuclear cardiac imaging test    Acute on chronic heart failure with mid-range ejection fraction (HFmEF) (HCC) 01/26/2022   Alcohol use 01/26/2022   Anal fissure    Aortic stenosis    Atrial fibrillation with RVR (HCC) 01/27/2022   Benign essential HTN 08/28/2017   Bilateral carotid bruits    Carpal tunnel syndrome, bilateral 09/15/2023   Cervical radiculopathy 09/15/2023   COPD (chronic obstructive pulmonary disease) (HCC)    Coronary artery disease involving native coronary artery of native heart with angina pectoris (HCC)    Dyslipidemia    ED (erectile dysfunction)    Erectile dysfunction 06/29/2018   Hemorrhoids    Hypertension    Hypogonadism in male    Left bundle branch block (LBBB) 08/06/2018   Leg cramps    Light headed 08/28/2017   Near syncope 01/26/2022   Nonrheumatic aortic valve stenosis 08/28/2017   Pain of left shoulder region    Petechiae    Pneumonia 2016   S/P aortic valve replacement with bioprosthetic valve 08/06/2018   25 mm Edwards Intuity Elite stented bovine  pericardial bioprosthetic tissue valve  [id number] Model: 8300AB   S/P AVR (aortic valve replacement)  08/06/2018   25 mm Edwards Intuity Elite stented bovine pericardial bioprosthetic tissue valve  [id number] Model: 8300AB   S/P CABG x 1 08/06/2018   SVG to RCA, EVH via right thigh   Smoking    Syncope 04/09/2022   Tachycardia, unspecified 01/28/2022   Tobacco use 01/26/2022   Type 2 diabetes mellitus without complication, without  long-term current use of insulin  (HCC) 09/15/2023   Ulnar neuropathy of both upper extremities 09/15/2023   Vitamin D  deficiency     Past Surgical History:  Procedure Laterality Date   AORTIC VALVE REPLACEMENT N/A 08/06/2018   Procedure: AORTIC VALVE REPLACEMENT (AVR);  Surgeon: Dusty Sudie DEL, MD;  Location: [facility];  Service: Open Heart Surgery;  Laterality: N/A;   APPENDECTOMY     COLONOSCOPY W/ POLYPECTOMY     x 2   CORONARY ARTERY BYPASS GRAFT N/A 08/06/2018   Procedure: CORONARY ARTERY BYPASS GRAFTING (CABG) times one using right greater saphenous vein harvested endoscopically and aortic valve replacement.;  Surgeon: Dusty Sudie DEL, MD;  Location: [facility] OR;  Service: Open Heart Surgery;  Laterality: N/A;   CORONARY/GRAFT ANGIOGRAPHY N/A 11/21/2020   Procedure: CORONARY/GRAFT ANGIOGRAPHY;  Surgeon: Burnard Debby LABOR, MD;  Location: [facility] INVASIVE CV LAB;  Service: Cardiovascular;  Laterality: N/A;   Lung Biospy Right    lower lung   PACEMAKER IMPLANT N/A 04/10/2022   Procedure: PACEMAKER IMPLANT;  Surgeon: Inocencio Soyla Lunger, MD;  Location: [facility] INVASIVE CV LAB;  Service: Cardiovascular;  Laterality: N/A;   RIGHT/LEFT HEART CATH AND CORONARY ANGIOGRAPHY N/A 06/30/2018   Procedure: RIGHT/LEFT HEART CATH AND CORONARY ANGIOGRAPHY;  Surgeon: Claudene Victory ORN, MD;  Location: [facility] INVASIVE CV LAB;  Service: Cardiovascular;  Laterality: N/A;   TEE WITHOUT CARDIOVERSION N/A 08/06/2018   Procedure: TRANSESOPHAGEAL ECHOCARDIOGRAM (TEE);  Surgeon: Dusty Sudie DEL, MD;  Location[facility] OR;  Service: Open Heart Surgery;  Laterality: N/A;   TOOTH EXTRACTION N/A 07/30/2018   Procedure: Extraction of tooth #5 w/ Alveoloplasty and Gross debridement of remaining dentition.;  Surgeon: Cyndee Tanda FALCON, DDS;  Location: [facility] OR;  Service: Oral Surgery;  Laterality: N/A;    Current Medications: Current Meds  Medication Sig   albuterol  (PROVENTIL  HFA;VENTOLIN  HFA) 108 (90 Base) MCG/ACT inhaler Inhale 2 puffs into the  lungs every 6 (six) hours as needed for wheezing or shortness of breath.    aspirin  EC 81 MG tablet Take 1 tablet (81 mg total) by mouth daily.   atorvastatin  (LIPITOR ) 40 MG tablet TAKE HALF TABLET (20MG ) BY MOUTH DAILY.   D3-50 1.25 MG (50000 UT) capsule Take 50,000 Units by mouth every Sunday.   furosemide  (LASIX ) 40 MG tablet Take 40 mg by mouth daily.   lisinopril  (ZESTRIL ) 20 MG tablet Take 1 tablet (20 mg total) by mouth daily.   metoprolol  succinate (TOPROL -XL) 25 MG 24 hr tablet TAKE 1 TABLET (25 MG TOTAL) BY MOUTH DAILY.   naproxen sodium (ALEVE) 220 MG tablet Take 440 mg by mouth every morning.   ranolazine  (RANEXA ) 1000 MG SR tablet Take 1 tablet (1,000 mg total) by mouth 2 (two) times daily.   spironolactone (ALDACTONE) 25 MG tablet Take 25 mg by mouth daily.   valACYclovir (VALTREX) 500 MG tablet Take 1,500 mg by mouth daily.     Allergies:   Patient has no known allergies.   Social History   Socioeconomic History   Marital status: Married  Spouse name: Not on file   Number of children: 5   Years of education: Not on file   Highest education level: Not on file  Occupational History   Not on file  Tobacco Use   Smoking status: Former    Current packs/day: 0.00    Average packs/day: 1 pack/day for 50.0 years (50.0 ttl pk-yrs)    Types: Cigarettes    Start date: 08/06/1968    Quit date: 08/06/2018    Years since quitting: 5.2   Smokeless tobacco: Never  Vaping Use   Vaping status: Never Used  Substance and Sexual Activity   Alcohol use: Yes    Alcohol/week: 14.0 standard drinks of alcohol    Types: 14 Shots of liquor per week    Comment: Bacardi rum daily   Drug use: Never   Sexual activity: Not on file  Other Topics Concern   Not on file  Social History Narrative   Not on file   Social Drivers of Health   Financial Resource Strain: Not on file  Food Insecurity: Not on file  Transportation Needs: Not on file  Physical Activity: Not on file  Stress:  Not on file  Social Connections: Not on file     Family History: The patient's family history includes Coronary artery disease in his mother; Hypertension in his father and mother.  ROS:   Please see the history of present illness.    All other systems reviewed and are negative.  EKGs/Labs/Other Studies Reviewed:    The following studies were reviewed today: .SABRAEKG Interpretation Date/Time:  Monday October 27 2023 14:27:05 EST Ventricular Rate:  72 PR Interval:  202 QRS Duration:  166 QT Interval:  450 QTC Calculation: 492 R Axis:   -24  Text Interpretation: Atrial-paced rhythm Left bundle branch block Abnormal ECG When compared with ECG of 11-Apr-2022 06:11, Electronic atrial pacemaker has replaced Sinus rhythm Confirmed by Edwyna Backers [phone]) on 10/27/2023 2:39:20 PM     Recent Labs: 12/24/2022: ALT 17; BUN 16; Creatinine, Ser 0.88; Hemoglobin 14.6; Platelets 189; Potassium 4.3; Sodium 141; TSH 1.240  Recent Lipid Panel    Component Value Date/Time   CHOL 113 12/24/2022 0905   TRIG 102 12/24/2022 0905   HDL 41 12/24/2022 0905   CHOLHDL 2.8 12/24/2022 0905   LDLCALC 53 12/24/2022 0905    Physical Exam:    VS:  BP 124/72   Pulse 67   Ht 5' 11 (1.803 m)   Wt 237 lb 6.4 oz (107.7 kg)   SpO2 95%   BMI 33.11 kg/m     Wt Readings from Last 3 Encounters:  10/27/23 237 lb 6.4 oz (107.7 kg)  12/17/22 246 lb 3.2 oz (111.7 kg)  07/15/22 239 lb (108.4 kg)     GEN: Patient is in no acute distress HEENT: Normal NECK: No JVD; No carotid bruits LYMPHATICS: No lymphadenopathy CARDIAC: Hear sounds regular, 2/6 systolic murmur at the apex. RESPIRATORY:  Clear to auscultation without rales, wheezing or rhonchi  ABDOMEN: Soft, non-tender, non-distended MUSCULOSKELETAL:  No edema; No deformity  SKIN: Warm and dry NEUROLOGIC:  Alert and oriented x 3 PSYCHIATRIC:  Normal affect   Signed, Backers JONELLE Edwyna, MD  10/27/2023 2:41 PM    [facility]

## 2023-11-06 ENCOUNTER — Other Ambulatory Visit: Payer: Self-pay | Admitting: Cardiology

## 2023-11-14 NOTE — Addendum Note (Signed)
Addended by: Elease Etienne A on: 11/14/2023 08:50 AM   Modules accepted: Orders

## 2023-11-14 NOTE — Progress Notes (Signed)
 Remote pacemaker transmission.

## 2023-12-10 ENCOUNTER — Other Ambulatory Visit: Payer: Self-pay | Admitting: Cardiology

## 2024-01-08 DIAGNOSIS — I350 Nonrheumatic aortic (valve) stenosis: Secondary | ICD-10-CM | POA: Diagnosis not present

## 2024-01-08 DIAGNOSIS — E785 Hyperlipidemia, unspecified: Secondary | ICD-10-CM | POA: Diagnosis not present

## 2024-01-08 DIAGNOSIS — J449 Chronic obstructive pulmonary disease, unspecified: Secondary | ICD-10-CM | POA: Diagnosis not present

## 2024-01-08 DIAGNOSIS — E1165 Type 2 diabetes mellitus with hyperglycemia: Secondary | ICD-10-CM | POA: Diagnosis not present

## 2024-01-08 DIAGNOSIS — Z6834 Body mass index (BMI) 34.0-34.9, adult: Secondary | ICD-10-CM | POA: Diagnosis not present

## 2024-01-09 ENCOUNTER — Ambulatory Visit (INDEPENDENT_AMBULATORY_CARE_PROVIDER_SITE_OTHER): Payer: Medicare HMO

## 2024-01-09 DIAGNOSIS — I495 Sick sinus syndrome: Secondary | ICD-10-CM

## 2024-01-09 DIAGNOSIS — M5412 Radiculopathy, cervical region: Secondary | ICD-10-CM | POA: Diagnosis not present

## 2024-01-09 DIAGNOSIS — G5620 Lesion of ulnar nerve, unspecified upper limb: Secondary | ICD-10-CM | POA: Diagnosis not present

## 2024-01-09 DIAGNOSIS — G5600 Carpal tunnel syndrome, unspecified upper limb: Secondary | ICD-10-CM | POA: Diagnosis not present

## 2024-01-09 LAB — CUP PACEART REMOTE DEVICE CHECK
Battery Voltage: 85
Date Time Interrogation Session: 20250328083348
Implantable Lead Connection Status: 753985
Implantable Lead Connection Status: 753985
Implantable Lead Implant Date: 20230628
Implantable Lead Implant Date: 20230628
Implantable Lead Location: 753859
Implantable Lead Location: 753860
Implantable Lead Model: 377
Implantable Lead Model: 377
Implantable Lead Serial Number: 8000913511
Implantable Lead Serial Number: 8000943292
Implantable Pulse Generator Implant Date: 20230628
Pulse Gen Model: 407145
Pulse Gen Serial Number: 70446212

## 2024-01-15 ENCOUNTER — Telehealth: Payer: Self-pay | Admitting: Cardiology

## 2024-01-15 MED ORDER — RANOLAZINE ER 1000 MG PO TB12: 1000.0000 mg | ORAL_TABLET | Freq: Two times a day (BID) | ORAL | 3 refills | Status: AC

## 2024-01-15 NOTE — Telephone Encounter (Signed)
*  STAT* If patient is at the pharmacy, call can be transferred to refill team.   1. Which medications need to be refilled? (please list name of each medication and dose if known) ranolazine (RANEXA) 1000 MG SR tablet   2. Which pharmacy/location (including street and city if local pharmacy) is medication to be sent to?  [id number] - [place], [place] - [address]    3. Do they need a 30 day or 90 day supply? 90

## 2024-02-02 DIAGNOSIS — G5622 Lesion of ulnar nerve, left upper limb: Secondary | ICD-10-CM | POA: Diagnosis not present

## 2024-02-02 DIAGNOSIS — E119 Type 2 diabetes mellitus without complications: Secondary | ICD-10-CM | POA: Diagnosis not present

## 2024-02-02 DIAGNOSIS — J449 Chronic obstructive pulmonary disease, unspecified: Secondary | ICD-10-CM | POA: Diagnosis not present

## 2024-02-02 DIAGNOSIS — G5602 Carpal tunnel syndrome, left upper limb: Secondary | ICD-10-CM | POA: Diagnosis not present

## 2024-02-02 DIAGNOSIS — F1010 Alcohol abuse, uncomplicated: Secondary | ICD-10-CM | POA: Diagnosis not present

## 2024-02-02 DIAGNOSIS — I2510 Atherosclerotic heart disease of native coronary artery without angina pectoris: Secondary | ICD-10-CM | POA: Diagnosis not present

## 2024-02-02 DIAGNOSIS — E785 Hyperlipidemia, unspecified: Secondary | ICD-10-CM | POA: Diagnosis not present

## 2024-02-02 DIAGNOSIS — I10 Essential (primary) hypertension: Secondary | ICD-10-CM | POA: Diagnosis not present

## 2024-02-02 DIAGNOSIS — I110 Hypertensive heart disease with heart failure: Secondary | ICD-10-CM | POA: Diagnosis not present

## 2024-02-02 DIAGNOSIS — I509 Heart failure, unspecified: Secondary | ICD-10-CM | POA: Diagnosis not present

## 2024-02-17 NOTE — Progress Notes (Signed)
 Remote pacemaker transmission.

## 2024-03-30 ENCOUNTER — Telehealth: Payer: Self-pay

## 2024-03-30 DIAGNOSIS — M5412 Radiculopathy, cervical region: Secondary | ICD-10-CM | POA: Diagnosis not present

## 2024-03-30 NOTE — Telephone Encounter (Signed)
   Pre-operative Risk Assessment    Patient Name: Blake Griffin  DOB: [birth year] MRN: [id number]   Date of last office visit: 10/27/23 Hillis Lu, MD Date of next office visit: 06/07/24 WILL CAMNITZ, MD   Request for Surgical Clearance    Procedure:  ANTERIOR CERVICAL DISCECTOMY AND INTERBODY FUSION  Date of Surgery:  Clearance TBD                                Surgeon:  Harless Lien, MD Surgeon's Group or Practice Name:  [facility] Phone number:  [phone] Fax number:  [phone]   Type of Clearance Requested:   - Medical  - Pharmacy:  Hold Aspirin      Type of Anesthesia:  General    Additional requests/questions:    SignedCollin Deal   03/30/2024, 5:06 PM

## 2024-03-31 DIAGNOSIS — E559 Vitamin D deficiency, unspecified: Secondary | ICD-10-CM | POA: Diagnosis not present

## 2024-03-31 DIAGNOSIS — Z01818 Encounter for other preprocedural examination: Secondary | ICD-10-CM | POA: Diagnosis not present

## 2024-03-31 DIAGNOSIS — R9431 Abnormal electrocardiogram [ECG] [EKG]: Secondary | ICD-10-CM | POA: Diagnosis not present

## 2024-03-31 DIAGNOSIS — Z79899 Other long term (current) drug therapy: Secondary | ICD-10-CM | POA: Diagnosis not present

## 2024-03-31 DIAGNOSIS — I447 Left bundle-branch block, unspecified: Secondary | ICD-10-CM | POA: Diagnosis not present

## 2024-03-31 DIAGNOSIS — M79603 Pain in arm, unspecified: Secondary | ICD-10-CM | POA: Diagnosis not present

## 2024-03-31 NOTE — Telephone Encounter (Signed)
   Name: Blake Griffin  DOB: [birth year]  MRN: [id number]  Primary Cardiologist: Nelia Balzarine, MD   Preoperative team, please contact this patient and set up a phone call appointment for further preoperative risk assessment. Please obtain consent and complete medication review. Thank you for your help.  I confirm that guidance regarding antiplatelet and oral anticoagulation therapy has been completed and, if necessary, noted below.  May hold ASA if necessary.  I also confirmed the patient resides in the state of [place] . As per [facility] Medical Board telemedicine laws, the patient must reside in the state in which the provider is licensed.   Lamond Pilot, PA 03/31/2024, 10:33 AM [facility]

## 2024-03-31 NOTE — Telephone Encounter (Signed)
 Left message to call back to schedule tele pre op appt.

## 2024-04-01 ENCOUNTER — Telehealth: Payer: Self-pay

## 2024-04-01 DIAGNOSIS — Z01818 Encounter for other preprocedural examination: Secondary | ICD-10-CM | POA: Diagnosis not present

## 2024-04-01 DIAGNOSIS — Z950 Presence of cardiac pacemaker: Secondary | ICD-10-CM | POA: Diagnosis not present

## 2024-04-01 NOTE — Telephone Encounter (Signed)
 Patient has been scheduled for tele preop appt med rec and consent done     Patient Consent for Virtual Visit         Blake Griffin has provided verbal consent on 04/01/2024 for a virtual visit (video or telephone).   CONSENT FOR VIRTUAL VISIT FOR:  Blake Griffin  By participating in this virtual visit I agree to the following:  I hereby voluntarily request, consent and authorize [facility] and its employed or contracted physicians, physician assistants, nurse practitioners or other licensed health care professionals (the Practitioner), to provide me with telemedicine health care services (the "Services) as deemed necessary by the treating Practitioner. I acknowledge and consent to receive the Services by the Practitioner via telemedicine. I understand that the telemedicine visit will involve communicating with the Practitioner through live audiovisual communication technology and the disclosure of certain medical information by electronic transmission. I acknowledge that I have been given the opportunity to request an in-person assessment or other available alternative prior to the telemedicine visit and am voluntarily participating in the telemedicine visit.  I understand that I have the right to withhold or withdraw my consent to the use of telemedicine in the course of my care at any time, without affecting my right to future care or treatment, and that the Practitioner or I may terminate the telemedicine visit at any time. I understand that I have the right to inspect all information obtained and/or recorded in the course of the telemedicine visit and may receive copies of available information for a reasonable fee.  I understand that some of the potential risks of receiving the Services via telemedicine include:  Delay or interruption in medical evaluation due to technological equipment failure or disruption; Information transmitted may not be sufficient (e.g. poor resolution of  images) to allow for appropriate medical decision making by the Practitioner; and/or  In rare instances, security protocols could fail, causing a breach of personal health information.  Furthermore, I acknowledge that it is my responsibility to provide information about my medical history, conditions and care that is complete and accurate to the best of my ability. I acknowledge that Practitioner's advice, recommendations, and/or decision may be based on factors not within their control, such as incomplete or inaccurate data provided by me or distortions of diagnostic images or specimens that may result from electronic transmissions. I understand that the practice of medicine is not an exact science and that Practitioner makes no warranties or guarantees regarding treatment outcomes. I acknowledge that a copy of this consent can be made available to me via my patient portal [facility] MyChart), or I can request a printed copy by calling the office of [facility].    I understand that my insurance will be billed for this visit.   I have read or had this consent read to me. I understand the contents of this consent, which adequately explains the benefits and risks of the Services being provided via telemedicine.  I have been provided ample opportunity to ask questions regarding this consent and the Services and have had my questions answered to my satisfaction. I give my informed consent for the services to be provided through the use of telemedicine in my medical care

## 2024-04-01 NOTE — Telephone Encounter (Signed)
 Patient called back and has been scheduled for tele preop appt

## 2024-04-01 NOTE — Telephone Encounter (Signed)
 2nd attempt contacting patient to schedule tele preop appt no answer left a detailed vm to call back and schedule

## 2024-04-07 ENCOUNTER — Ambulatory Visit: Attending: Cardiology

## 2024-04-07 DIAGNOSIS — J449 Chronic obstructive pulmonary disease, unspecified: Secondary | ICD-10-CM | POA: Diagnosis not present

## 2024-04-07 DIAGNOSIS — Z01810 Encounter for preprocedural cardiovascular examination: Secondary | ICD-10-CM

## 2024-04-07 DIAGNOSIS — Z6833 Body mass index (BMI) 33.0-33.9, adult: Secondary | ICD-10-CM | POA: Diagnosis not present

## 2024-04-07 DIAGNOSIS — E1165 Type 2 diabetes mellitus with hyperglycemia: Secondary | ICD-10-CM | POA: Diagnosis not present

## 2024-04-07 DIAGNOSIS — Z01818 Encounter for other preprocedural examination: Secondary | ICD-10-CM | POA: Diagnosis not present

## 2024-04-07 DIAGNOSIS — Z1331 Encounter for screening for depression: Secondary | ICD-10-CM | POA: Diagnosis not present

## 2024-04-07 DIAGNOSIS — I10 Essential (primary) hypertension: Secondary | ICD-10-CM | POA: Diagnosis not present

## 2024-04-07 NOTE — Progress Notes (Signed)
   Name:  Blake Griffin  DOB:  17-Aug-1956  MRN:  [id number]   Primary Cardiologist: Jennifer JONELLE Crape, MD  Chart reviewed as part of pre-operative protocol coverage. Patient was contacted 04/07/2024 in reference to pre-operative risk assessment for pending surgery as outlined below.  Blake Griffin was last seen on 10/27/23 by Dr. Revankar.  Since that day, Blake Griffin has done overall well however he notes he was seen by his PCP this morning who recommended that patient be seen in person by Dr. Crape, patient elected to cancel appointment today and plans to schedule follow-up with Dr. Revankar for preoperative cardiac evaluation.   Pre-op  covering staff: - Please schedule appointment and call patient to inform them. If patient already had an upcoming appointment within acceptable timeframe, please add pre-op  clearance to the appointment notes so provider is aware. - Please contact requesting surgeon's office via preferred method (i.e, phone, fax) to inform them of need for appointment prior to surgery.  Yuki Purves D Chelan Heringer, NP 04/07/2024, 10:32 AM

## 2024-04-09 ENCOUNTER — Ambulatory Visit (INDEPENDENT_AMBULATORY_CARE_PROVIDER_SITE_OTHER): Payer: Medicare HMO

## 2024-04-09 DIAGNOSIS — I495 Sick sinus syndrome: Secondary | ICD-10-CM

## 2024-04-09 LAB — CUP PACEART REMOTE DEVICE CHECK
Battery Voltage: 85
Date Time Interrogation Session: 20250627085637
Implantable Lead Connection Status: 753985
Implantable Lead Connection Status: 753985
Implantable Lead Implant Date: 20230628
Implantable Lead Implant Date: 20230628
Implantable Lead Location: 753859
Implantable Lead Location: 753860
Implantable Lead Model: 377
Implantable Lead Model: 377
Implantable Lead Serial Number: 8000913511
Implantable Lead Serial Number: 8000943292
Implantable Pulse Generator Implant Date: 20230628
Pulse Gen Model: 407145
Pulse Gen Serial Number: 70446212

## 2024-04-11 ENCOUNTER — Ambulatory Visit: Payer: Self-pay | Admitting: Cardiology

## 2024-04-12 NOTE — Progress Notes (Addendum)
 Cardiology Office Note   Date:  04/14/2024  ID:  [name], DOB [birth year], MRN [id number] PCP: Elaine Garnette BIRCH., MD  [facility] Providers Cardiologist:  Shanica Castellanos JONELLE Crape, MD Electrophysiologist:  Soyla Gladis Norton, MD     History of Present Illness Blake Griffin is a 68 y.o. male with a past medical history of HFmrEF, CAD s/p CABG x 1, aortic stenosis s/p AVR, atrial fibrillation, hypertension, LBBB, COPD, DM 2, SSS s/p PPM, tobacco abuse.  04/09/2024 device check normal functioning 04/10/2022 ppm implantation 01/29/2022 monitor significant pauses lasting 5.4 seconds, advised to present to the emergency department for further evaluation 01/27/2022 echo EF 50 to 55%, impaired relaxation, trivial MR, bioprosthetic aortic valve present in the correct location with normal structure and functioning 11/21/2020 cardiac cath previously placed graft widely patent, medical therapy recommended 11/15/2020 Lexiscan  reversible medium defect of moderate severity consistent with ischemia, intermediate risk 07/2018 CABG x 1 and AVR using Edwards Intuity Elite 25 mm tissue valve; CABG x 1 SVG to distal RCA 06/30/2018 right left heart cath severe aortic stenosis; mid RCA 50 to 75% stenosis  [facility] with [facility] in 2019 for the evaluation of chest pain.  He underwent right left heart cath revealing severe aortic stenosis also his mid RCA was 50 to 75% stenosed.  In October 2019 he underwent CABG x 1 SVG to distal RCA, he also underwent AVR at this time using Edwards Intuity Elite 25 mm tissue valve.  In 2022 he had a Lexiscan  revealing a medium defect of moderate severity consistent with ischemia >> repeat left heart cath revealed widely patent graft site with recommendations for medical therapy.  In 2023 he wore a monitor revealing pauses up to 5.5 seconds, was advised to go to the hospital for further evaluation and ultimately underwent PPM implantation on 04/10/2022.  Most recently was  evaluated by Dr. Crape on 10/27/2023, he was overall stable from a cardiac perspective, encouraged to be more physically active and also smoking cessation with plans to follow-up in 9 months.  He presents today for preoperative evaluation for upcoming ACDF.  He has been doing stable since he was last evaluated on our office.  He is bothered by pedal edema, not sure if this is worse than normal for him or at his baseline.  He is also bothered by shortness of breath however he states this is at baseline for him. He denies chest pain, palpitations,pnd, orthopnea, n, v, dizziness, syncope, edema, weight gain, or early satiety.    ROS: Review of Systems  Respiratory:  Positive for sputum production.   Cardiovascular:  Positive for leg swelling.  Endo/Heme/Allergies:  Bruises/bleeds easily.  All other systems reviewed and are negative.    Studies Reviewed EKG Interpretation Date/Time:  Wednesday April 14 2024 08:50:33 EDT Ventricular Rate:  76 PR Interval:  200 QRS Duration:  172 QT Interval:  462 QTC Calculation: 519 R Axis:   -87  Text Interpretation: Atrial-paced rhythm Left axis deviation Non-specific intra-ventricular conduction block Abnormal ECG When compared with ECG of 27-Oct-2023 14:27, QRS axis Shifted left Confirmed by Carlin Nest [id number]) on 04/14/2024 9:06:22 AM    Cardiac Studies & Procedures   ______________________________________________________________________________________________ CARDIAC CATHETERIZATION  CARDIAC CATHETERIZATION 11/21/2020  Conclusion  Prox LAD to Mid LAD lesion is 20% stenosed.  Prox RCA to Mid RCA lesion is 70% stenosed.  Prox RCA lesion is 80% stenosed.  Well-seated bovine pericardial tissue valve.  Mild smooth 20% narrowing in the LAD.  Normal left  circumflex coronary artery  Native RCA stenosis with 80% proximal and 70% mid stenosis with a large widely patent SVG anastomosing into the RCA prior to the PDA takeoff with his visualization  of the entire native RCA via the graft injection.  RECOMMENDATION: Medical therapy with continued lipid-lowering therapy, aspirin , and optimal blood pressure control  Findings Coronary Findings Diagnostic  Dominance: Right  Left Anterior Descending Prox LAD to Mid LAD lesion is 20% stenosed.  Right Coronary Artery Prox RCA lesion is 80% stenosed. Prox RCA to Mid RCA lesion is 70% stenosed.  Graft To Dist RCA  Intervention  No interventions have been documented.   CARDIAC CATHETERIZATION  CARDIAC CATHETERIZATION 06/30/2018  Conclusion  Critical aortic stenosis, calculated valve area 0.91 cm.  Peak to peak gradient 76 mmHg, mean gradient 64 mmHg.  Intermediate stenosis mid RCA in the 60 to 75% range.  Widely patent left coronary system.  RECOMMENDATIONS:   Severe aortic stenosis documented by echo and invasive evaluation.  Referred to the aortic valve clinic for consideration of low risk TAVR with hybrid RCA stent (question timing) versus SAVR and RCA bypass.  Start high intensity statin therapy.  Avoid nonsteroidal therapy.  Continue aspirin .  Avoids activities that cause chest discomfort.  Recommend Aspirin  81mg  daily for moderate CAD.  Findings Coronary Findings Diagnostic  Dominance: Right  Left Anterior Descending Ost LAD to Prox LAD lesion is 25% stenosed.  Right Coronary Artery Mid RCA lesion is 65% stenosed.  Intervention  No interventions have been documented.   STRESS TESTS  MYOCARDIAL PERFUSION IMAGING 11/15/2020  Narrative  The left ventricular ejection fraction is mildly decreased (45-54%).  Nuclear stress EF: 47%.  There was no ST segment deviation noted during stress.  No T wave inversion was noted during stress.  Defect 1: There is a reversible medium defect of moderate severity present in the basal inferior and mid inferior location.  Findings consistent with ischemia.  This is an intermediate risk study.    ECHOCARDIOGRAM  ECHOCARDIOGRAM COMPLETE 01/27/2022  Narrative ECHOCARDIOGRAM REPORT    Patient Name:   Blake Griffin Date of Exam: 01/27/2022 Medical Rec #:  [id number]     Height:       71.0 in Accession #:    [id number]    Weight:       226.8 lb Date of Birth:  1956/04/21     BSA:          2.224 m Patient Age:    66 years      BP:           140/86 mmHg Patient Gender: M             HR:           77 bpm. Exam Location:  Inpatient  Procedure: 2D Echo, Cardiac Doppler and Color Doppler  Indications:    CHF  History:        Patient has prior history of Echocardiogram examinations, most recent 12/01/2020. COPD, Arrythmias:LBBB; Risk Factors:Current Smoker. Near syncope. 25 mm Edwards bioprosthetic valve in the aortic position. Procedure date 08/06/18.  Sonographer:    Rome Eans RDCS (AE) Referring Phys: [id number] NILDA HERO [facility]  IMPRESSIONS   1. Left ventricular ejection fraction, by estimation, is 50 to 55%. The left ventricle has low normal function. Left ventricular endocardial border not optimally defined to evaluate regional wall motion. There is mild concentric left ventricular hypertrophy. Left ventricular diastolic parameters are consistent with Grade I diastolic dysfunction (impaired relaxation).  2. Right ventricular systolic function is normal. The right ventricular size is normal. Tricuspid regurgitation signal is inadequate for assessing PA pressure. 3. The mitral valve is grossly normal. Trivial mitral valve regurgitation. 4. The aortic valve has been repaired/replaced. Aortic valve regurgitation is not visualized. Echo findings are consistent with normal structure and function of the aortic valve prosthesis. Aortic valve mean gradient measures 8.0 mmHg. 5. Aortic dilatation noted. There is borderline dilatation of the ascending aorta, measuring 37 mm. 6. The inferior vena cava is normal in size with greater than 50% respiratory variability, suggesting right atrial  pressure of 3 mmHg.  Comparison(s): Compared to prior TTE in 11/2020, there is no significant change. Prior EF 45-50%; current EF ~50%. Prior mean AoV gradient , currently .  FINDINGS Left Ventricle: Left ventricular ejection fraction, by estimation, is 50 to 55%. The left ventricle has low normal function. Left ventricular endocardial border not optimally defined to evaluate regional wall motion. The left ventricular internal cavity size was normal in size. There is mild concentric left ventricular hypertrophy. Abnormal (paradoxical) septal motion, consistent with left bundle branch block. Left ventricular diastolic parameters are consistent with Grade I diastolic dysfunction (impaired relaxation).  Right Ventricle: The right ventricular size is normal. Right vetricular wall thickness was not well visualized. Right ventricular systolic function is normal. Tricuspid regurgitation signal is inadequate for assessing PA pressure.  Left Atrium: Left atrial size was not well visualized.  Right Atrium: Right atrial size was not well visualized.  Pericardium: There is no evidence of pericardial effusion.  Mitral Valve: The mitral valve is grossly normal. There is mild thickening of the mitral valve leaflet(s). There is mild calcification of the mitral valve leaflet(s). Mild to moderate mitral annular calcification. Trivial mitral valve regurgitation. MV peak gradient, 5.9 mmHg. The mean mitral valve gradient is 2.0 mmHg.  Tricuspid Valve: The tricuspid valve is not well visualized. Tricuspid valve regurgitation is trivial.  Aortic Valve: The aortic valve has been repaired/replaced. Aortic valve regurgitation is not visualized. Aortic valve mean gradient measures 8.0 mmHg. Aortic valve peak gradient measures 13.1 mmHg. Aortic valve area, by VTI measures 2.16 cm. There is a 25 mm Edwards bioprosthetic valve present in the aortic position. Echo findings are consistent with normal structure  and function of the aortic valve prosthesis.  Pulmonic Valve: The pulmonic valve was not well visualized. Pulmonic valve regurgitation is trivial.  Aorta: The aortic root is normal in size and structure and aortic dilatation noted. There is borderline dilatation of the ascending aorta, measuring 37 mm.  Venous: The inferior vena cava is normal in size with greater than 50% respiratory variability, suggesting right atrial pressure of 3 mmHg.  IAS/Shunts: The atrial septum is grossly normal.   LEFT VENTRICLE PLAX 2D LVIDd:         4.60 cm LVIDs:         3.40 cm LV PW:         1.20 cm LV IVS:        1.60 cm LVOT diam:     2.45 cm LV SV:         83 LV SV Index:   37 LVOT Area:     4.71 cm   RIGHT VENTRICLE             IVC RV Basal diam:  3.50 cm     IVC diam: 1.60 cm RV S prime:     10.90 cm/s TAPSE (M-mode): 1.5 cm  LEFT ATRIUM  Index        RIGHT ATRIUM           Index LA diam:        3.70 cm 1.66 cm/m   RA Area:     19.80 cm LA Vol (A2C):   62.3 ml 28.01 ml/m  RA Volume:   58.10 ml  26.12 ml/m LA Vol (A4C):   37.8 ml 16.99 ml/m LA Biplane Vol: 51.0 ml 22.93 ml/m AORTIC VALVE AV Area (Vmax):    2.97 cm AV Area (Vmean):   2.40 cm AV Area (VTI):     2.16 cm AV Vmax:           181.00 cm/s AV Vmean:          129.000 cm/s AV VTI:            0.382 m AV Peak Grad:      13.1 mmHg AV Mean Grad:      8.0 mmHg LVOT Vmax:         114.00 cm/s LVOT Vmean:        65.800 cm/s LVOT VTI:          0.175 m LVOT/AV VTI ratio: 0.46  AORTA Ao Root diam: 2.50 cm Ao Asc diam:  3.70 cm  MITRAL VALVE MV Area (PHT): 2.31 cm     SHUNTS MV Area VTI:   2.67 cm     Systemic VTI:  0.18 m MV Peak grad:  5.9 mmHg     Systemic Diam: 2.45 cm MV Mean grad:  2.0 mmHg MV Vmax:       1.21 m/s MV Vmean:      72.3 cm/s MV Decel Time: 329 msec MV E velocity: 76.40 cm/s MV A velocity: 110.00 cm/s MV E/A ratio:  0.69  Powell Sorrow MD Electronically signed by Powell Sorrow MD Signature Date/Time: 01/27/2022/5:07:14 PM    Final   TEE  ECHO TEE 08/06/2018  Interpretation Summary  Aortic valve: The valve is trileaflet. Severe valve thickening present. Severe valve calcification present. Severely decreased leaflet separation. Severe stenosis. Trace regurgitation. No AV vegetation.  Right ventricle: Normal cavity size, wall thickness and ejection fraction.  Tricuspid valve: Trace regurgitation.  MONITORS  LONG TERM MONITOR (3-14 DAYS) 02/25/2022  Narrative Patch Wear Time:  14 days and 0 hours (2023-04-22T10:55:19-0400 to 2023-05-06T10:55:23-0400)  Patient had a min HR of 35 bpm, max HR of 164 bpm, and avg HR of 66 bpm.  Predominant underlying rhythm was Sinus Rhythm. Bundle Branch Block/IVCD was present. 11 Supraventricular Tachycardia runs occurred, the run with the fastest interval lasting 9 beats with a max rate of 164 bpm, the longest lasting 13.5 secs with an avg rate of 129 bpm. True duration of Supraventricular Tachycardia difficult to ascertain due to artifact.  5 Pauses occurred, the longest lasting 5.4 secs (11 bpm).True duration of Pause(s) difficult to ascertain due to artifact. Isolated SVEs were occasional (1.2%, 14070), SVE Couplets were rare (<1.0%, 598), and SVE Triplets were rare (<1.0%, 22).  Isolated VEs were rare (<1.0%), VE Couplets were rare (<1.0%), and no VE Triplets were present.  Impression: Markedly abnormal event monitor.  Patient has significant pauses lasting the longest at 5.4 seconds.  Patient also had baseline intraventricular conduction delay.  There were episodes of atrial runs also.  In view of this the patient was advised to go to the nearest emergency room.  He was advised not to drive himself but have a family member drive him to the  nearest emergency room and he agrees.  I discussed this with my nurse conveyed the report and the findings with the patient.  This was done yesterday.   CT SCANS  CT  CORONARY MORPH W/CTA COR W/SCORE 07/31/2018  Addendum 08/02/2018  4:31 PM ADDENDUM REPORT: 08/02/2018 16:29  CLINICAL DATA:  68 year old male with severe aortic stenosis and single vessel CAD being evaluated prior to SAVR and RCA bypass.  EXAM: Cardiac TAVR CT  TECHNIQUE: The patient was scanned on a [employer]. A 120 kV retrospective scan was triggered in the descending thoracic aorta at 111 HU's. Gantry rotation speed was 250 msecs and collimation was .6 mm. No beta blockade or nitro were given. The 3D data set was reconstructed in 5% intervals of the R-R cycle. Systolic and diastolic phases were analyzed on a dedicated work station using MPR, MIP and VRT modes. The patient received 80 cc of contrast.  FINDINGS: Aortic Valve: Trileaflet, but functionally bileaflet aortic valve with co-joined left and right leaflet and severe calcifications of the leaflets. No calcifications are extending into the LVOT.  Aorta: Normal size with moderate calcifications i the aortic arch and no dissection.  Sinotubular Junction: 29 x 29 mm  Ascending Thoracic Aorta: 36 x 35 mm  Aortic Arch: 25 x 24 mm  Descending Thoracic Aorta: 27 x 26 mm  Sinus of Valsalva Measurements:  Non-coronary: 33 mm  Right -coronary: 31 mm  Left -coronary: 31 mm  Coronary Artery Height above Annulus:  Left Main: 13.5 mm  Right Coronary: 18.1 mm  Virtual Basal Annulus Measurements:  Maximum/Minimum Diameter: 30.0 x 22.1 mm  Mean Diameter: 25.7 mm  Perimeter: 82.8 mm  Area: 519 mm2  Optimum Fluoroscopic Angle for Delivery: LAO 7 CAU 10  IMPRESSION: 1. Trileaflet, but functionally bileaflet aortic valve with co-joined left and right leaflet and severe calcifications of the leaflets. No calcifications are extending into the LVOT.  2. Sufficient coronary to annulus distance.  3. Optimum Fluoroscopic Angle for Delivery: LAO 7 CAU 10  4. No thrombus in the left atrial  appendage.   Electronically Signed By: Leim Moose On: 08/02/2018 16:29  Narrative EXAM: OVER-READ INTERPRETATION  CT CHEST  The following report is an over-read performed by radiologist Dr. Toribio Aye of [facility] on 07/31/2018. This over-read does not include interpretation of cardiac or coronary anatomy or pathology. The coronary calcium  score/coronary CTA interpretation by the cardiologist is attached.  COMPARISON:  Chest CT 12/22/2003.  FINDINGS: Extracardiac findings will be described separately under dictation for contemporaneously obtained CTA chest, abdomen and pelvis dated 07/31/2018.  IMPRESSION: Please see dictation for contemporaneously obtained CTA chest, abdomen and pelvis 07/31/2018 for full description of relevant extracardiac findings.  Electronically Signed: By: Toribio Aye M.D. On: 07/31/2018 14:21     ______________________________________________________________________________________________      Risk Assessment/Calculations  CHA2DS2-VASc Score = 5   This indicates a 7.2% annual risk of stroke. The patient's score is based upon: CHF History: 1 HTN History: 1 Diabetes History: 1 Stroke History: 0 Vascular Disease History: 1 Age Score: 1 Gender Score: 0            Physical Exam VS:  BP 138/80   Pulse 76   Resp (!) 98   Ht 5' 11 (1.803 m)   Wt 244 lb 9.6 oz (110.9 kg)   BMI 34.11 kg/m        Wt Readings from Last 3 Encounters:  04/14/24 244 lb 9.6 oz (110.9 kg)  10/27/23 237 lb 6.4 oz (107.7 kg)  12/17/22 246 lb 3.2 oz (111.7 kg)    GEN: Well nourished, well developed in no acute distress NECK: No JVD; No carotid bruits CARDIAC: RRR, 2/6 systolic murmur RESPIRATORY:  Clear to auscultation without rales, wheezing or rhonchi  ABDOMEN: Soft, non-tender, non-distended EXTREMITIES: +1 pitting edema right lower leg +2 pitting edema left lower leg; No deformity   ASSESSMENT AND PLAN CAD-s/p CABG x  1 2019 >> repeat cath 2022 revealed widely patent graft. Stable with no anginal symptoms. No indication for ischemic evaluation.  Continue aspirin  81 mg daily, Lipitor  40 mg daily, metoprolol  25 mg daily, Ranexa  1000 mg twice daily.  Check CBC.  HFmrEF-NYHA class I-II for shortness of breath, his lung sounds are clear but he does have pedal edema.  Will repeat BMET and proBNP.  Continue spironolactone 25 mg daily, metoprolol  25 mg daily, lisinopril  20 mg daily, Lasix  40 mg daily.  Hypertension-blood pressure slightly elevated in the office today for now we will continue to monitor as he states he is in a lot of pain, continue lisinopril  20 mg daily, spironolactone 25 mg daily.  Aortic stenosis s/p SAVR-echo in 2023 revealed valve was well-seated, mean gradient 8 mmHg.  Will repeat echocardiogram.  SSS s/p PPM-follows with EP, he will see Dr. Inocencio on 05/10/2024.  Preoperative cardiovascular evaluation- According to the Revised Cardiac Risk Index (RCRI), his Perioperative Risk of Major Cardiac Event is (%): 6.6 His Functional Capacity in METs is: 5.07 according to the Duke Activity Status Index (DASI).  Will repeat echocardiogram for surveillance of his AVR if this is okay then he can proceed with his upcoming surgery at an acceptable risk.  **Echocardiogram completed and overall looks okay.  He is optimized from a cardiac perspective and can undergo his ACDF at an acceptable risk.  Regarding his aspirin , this can be held 5 to 7 days prior to his surgery and restarted as soon as possible.        Dispo: CBC, proBNP, CMET, echocardiogram, follow up 6 months with Dr. Edwyna.   Signed, Delon JAYSON Hoover, NP

## 2024-04-14 ENCOUNTER — Ambulatory Visit: Attending: Cardiology | Admitting: Cardiology

## 2024-04-14 ENCOUNTER — Encounter: Payer: Self-pay | Admitting: Cardiology

## 2024-04-14 VITALS — BP 138/80 | HR 76 | Resp 98 | Ht 71.0 in | Wt 244.6 lb

## 2024-04-14 DIAGNOSIS — Z953 Presence of xenogenic heart valve: Secondary | ICD-10-CM | POA: Diagnosis not present

## 2024-04-14 DIAGNOSIS — I10 Essential (primary) hypertension: Secondary | ICD-10-CM

## 2024-04-14 DIAGNOSIS — I5022 Chronic systolic (congestive) heart failure: Secondary | ICD-10-CM

## 2024-04-14 DIAGNOSIS — Z79899 Other long term (current) drug therapy: Secondary | ICD-10-CM

## 2024-04-14 DIAGNOSIS — R0609 Other forms of dyspnea: Secondary | ICD-10-CM

## 2024-04-14 DIAGNOSIS — Z01810 Encounter for preprocedural cardiovascular examination: Secondary | ICD-10-CM

## 2024-04-14 DIAGNOSIS — I2510 Atherosclerotic heart disease of native coronary artery without angina pectoris: Secondary | ICD-10-CM

## 2024-04-14 NOTE — Patient Instructions (Addendum)
 Medication Instructions:  Your physician recommends that you continue on your current medications as directed. Please refer to the Current Medication list given to you today.  *If you need a refill on your cardiac medications before your next appointment, please call your pharmacy*   Lab Work: BMP, CBC, ProBNP- today If you have labs (blood work) drawn today and your tests are completely normal, you will receive your results only by: MyChart Message (if you have MyChart) OR A paper copy in the mail If you have any lab test that is abnormal or we need to change your treatment, we will call you to review the results.   Testing/Procedures: Your physician has requested that you have an echocardiogram. Echocardiography is a painless test that uses sound waves to create images of your heart. It provides your doctor with information about the size and shape of your heart and how well your heart's chambers and valves are working. This procedure takes approximately one hour. There are no restrictions for this procedure. Please do NOT wear cologne, perfume, aftershave, or lotions (deodorant is allowed). Please arrive 15 minutes prior to your appointment time.  Please note: We ask at that you not bring children with you during ultrasound (echo/ vascular) testing. Due to room size and safety concerns, children are not allowed in the ultrasound rooms during exams. Our front office staff cannot provide observation of children in our lobby area while testing is being conducted. An adult accompanying a patient to their appointment will only be allowed in the ultrasound room at the discretion of the ultrasound technician under special circumstances. We apologize for any inconvenience.    Follow-Up: At [facility], you and your health needs are our priority.  As part of our continuing mission to provide you with exceptional heart care, we have created designated Provider Care Teams.  These Care Teams include  your primary Cardiologist (physician) and Advanced Practice Providers (APPs -  Physician Assistants and Nurse Practitioners) who all work together to provide you with the care you need, when you need it.  We recommend signing up for the patient portal called MyChart.  Sign up information is provided on this After Visit Summary.  MyChart is used to connect with patients for Virtual Visits (Telemedicine).  Patients are able to view lab/test results, encounter notes, upcoming appointments, etc.  Non-urgent messages can be sent to your provider as well.   To learn more about what you can do with MyChart, go to [email or web address].    Your next appointment:   6 month(s) with Dr. Edwyna  The format for your next appointment:   In Person  Provider:   Delon Hoover, NP   Other Instructions NA

## 2024-04-15 ENCOUNTER — Ambulatory Visit: Payer: Self-pay | Admitting: Cardiology

## 2024-04-15 LAB — PRO B NATRIURETIC PEPTIDE: NT-Pro BNP: 168 pg/mL (ref 0–376)

## 2024-04-15 LAB — CBC
Hematocrit: 46.8 % (ref 37.5–51.0)
Hemoglobin: 15 g/dL (ref 13.0–17.7)
MCH: 32.5 pg (ref 26.6–33.0)
MCHC: 32.1 g/dL (ref 31.5–35.7)
MCV: 102 fL — ABNORMAL HIGH (ref 79–97)
Platelets: 175 x10E3/uL (ref 150–450)
RBC: 4.61 x10E6/uL (ref 4.14–5.80)
RDW: 13.1 % (ref 11.6–15.4)
WBC: 7.9 x10E3/uL (ref 3.4–10.8)

## 2024-04-15 LAB — BASIC METABOLIC PANEL WITH GFR
BUN/Creatinine Ratio: 21 (ref 10–24)
BUN: 18 mg/dL (ref 8–27)
CO2: 22 mmol/L (ref 20–29)
Calcium: 8.7 mg/dL (ref 8.6–10.2)
Chloride: 106 mmol/L (ref 96–106)
Creatinine, Ser: 0.86 mg/dL (ref 0.76–1.27)
Glucose: 88 mg/dL (ref 70–99)
Potassium: 4.2 mmol/L (ref 3.5–5.2)
Sodium: 143 mmol/L (ref 134–144)
eGFR: 93 mL/min/1.73

## 2024-04-19 ENCOUNTER — Encounter: Payer: Self-pay | Admitting: Orthopedic Surgery

## 2024-04-23 ENCOUNTER — Other Ambulatory Visit: Payer: Self-pay | Admitting: Cardiology

## 2024-04-23 ENCOUNTER — Encounter: Admitting: Pulmonary Disease

## 2024-04-29 ENCOUNTER — Ambulatory Visit: Attending: Cardiology

## 2024-04-29 DIAGNOSIS — R0609 Other forms of dyspnea: Secondary | ICD-10-CM

## 2024-04-29 LAB — ECHOCARDIOGRAM COMPLETE
AR max vel: 1.82 cm2
AV Area VTI: 2.04 cm2
AV Area mean vel: 1.98 cm2
AV Mean grad: 6 mmHg
AV Peak grad: 12.4 mmHg
Ao pk vel: 1.76 m/s
Area-P 1/2: 4.93 cm2
MV VTI: 1.57 cm2
S' Lateral: 3.8 cm

## 2024-05-09 NOTE — Progress Notes (Unsigned)
  Electrophysiology Office Note:   Date:  05/09/2024  ID:  Blake Griffin, DOB 1956-02-25, MRN [id number]  Primary Cardiologist: Jennifer JONELLE Crape, MD Primary Heart Failure: None Electrophysiologist: Blake Ganesh Gladis Norton, MD  {Click to update primary MD,subspecialty MD or APP then REFRESH:1}    History of Present Illness:   Blake Griffin is a 68 y.o. male with h/o SVT, near syncope, coronary artery disease, hyperlipidemia, hypertension, tobacco abuse seen today for routine electrophysiology followup.   Since last being seen in our clinic the patient reports doing ***.  he denies chest pain, palpitations, dyspnea, PND, orthopnea, nausea, vomiting, dizziness, syncope, edema, weight gain, or early satiety.   Review of systems complete and found to be negative unless listed in HPI.      EP Information / Studies Reviewed:    {EKGtoday:28818}      PPM Interrogation-  reviewed in detail today,  See PACEART report.  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   Wt Readings from Last 3 Encounters:  04/14/24 244 lb 9.6 oz (110.9 kg)  10/27/23 237 lb 6.4 oz (107.7 kg)  12/17/22 246 lb 3.2 oz (111.7 kg)     GEN: Well nourished, well developed in no acute distress NECK: No JVD; No carotid bruits CARDIAC: {EPRHYTHM:28826}, no murmurs, rubs, gallops RESPIRATORY:  Clear to auscultation without rales, wheezing or rhonchi  ABDOMEN: Soft, non-tender, non-distended EXTREMITIES:  No edema; No deformity   ASSESSMENT AND PLAN:    SND s/p Biotronik PPM  Normal PPM function See Pace Art report Sensing, threshold, impedance within normal limits Programming appropriate No changes today  2.  SVT: Has had minimal episodes.  Continue metoprolol .  3.  Hypertension:***  4.  Aortic  stenosis: Post AVR.  Plan per primary cardiology.  5.  Coronary artery disease: Post CABG x 1 in 2019.  No current chest pain.  Plan per primary cardiology.  Disposition:   Follow up with {EPPROVIDERS:28135::EP Team} {EPFOLLOW LE:71826}  Signed, Larrissa Stivers Gladis Norton, MD

## 2024-05-10 ENCOUNTER — Encounter: Payer: Self-pay | Admitting: Cardiology

## 2024-05-10 ENCOUNTER — Ambulatory Visit: Payer: Self-pay | Admitting: Cardiology

## 2024-05-10 ENCOUNTER — Ambulatory Visit: Attending: Cardiology | Admitting: Cardiology

## 2024-05-10 VITALS — BP 130/80 | HR 80 | Ht 71.0 in | Wt 238.0 lb

## 2024-05-10 DIAGNOSIS — I2510 Atherosclerotic heart disease of native coronary artery without angina pectoris: Secondary | ICD-10-CM | POA: Diagnosis not present

## 2024-05-10 DIAGNOSIS — I471 Supraventricular tachycardia, unspecified: Secondary | ICD-10-CM

## 2024-05-10 DIAGNOSIS — I495 Sick sinus syndrome: Secondary | ICD-10-CM

## 2024-05-10 DIAGNOSIS — I10 Essential (primary) hypertension: Secondary | ICD-10-CM

## 2024-05-10 LAB — CUP PACEART INCLINIC DEVICE CHECK
Date Time Interrogation Session: 20250728084119
Implantable Lead Connection Status: 753985
Implantable Lead Connection Status: 753985
Implantable Lead Implant Date: 20230628
Implantable Lead Implant Date: 20230628
Implantable Lead Location: 753859
Implantable Lead Location: 753860
Implantable Lead Model: 377
Implantable Lead Model: 377
Implantable Lead Serial Number: 8000913511
Implantable Lead Serial Number: 8000943292
Implantable Pulse Generator Implant Date: 20230628
Pulse Gen Model: 407145
Pulse Gen Serial Number: 70446212

## 2024-05-10 NOTE — Progress Notes (Signed)
 PERIOPERATIVE PRESCRIPTION FOR IMPLANTED CARDIAC DEVICE PROGRAMMING  Patient Information: Name:  Blake Griffin  DOB:  1956-10-10  MRN:  [id number]   Procedure:  ANTERIOR CERVICAL DISCECTOMY AND INTERBODY FUSION   Date of Surgery:  Clearance TBD                                  Surgeon:  ARLEY DARK, MD Surgeon's Group or Practice Name:  [facility] Phone number:  [phone] Fax number:  [phone]   Type of Clearance Requested:   - Medical  - Pharmacy:  Hold [facility]      Type of Anesthesia:  General  Device Information:  Clinic EP Physician:  Soyla Norton, MD   Device Type:  Pacemaker Manufacturer and Phone #:  [employer]: [phone] Pacemaker Dependent?:  No. Date of Last Device Check:  05/10/2024 Normal Device Function?:  Yes.    Electrophysiologist's Recommendations:  Have magnet available. Provide continuous ECG monitoring when magnet is used or reprogramming is to be performed.  Procedure should not interfere with device function.  No device programming or magnet placement needed.  Per Device Clinic Standing Orders, Delon DELENA Sharps, RN  3:23 PM 05/10/2024

## 2024-05-13 DIAGNOSIS — Z79899 Other long term (current) drug therapy: Secondary | ICD-10-CM | POA: Diagnosis not present

## 2024-05-13 DIAGNOSIS — R52 Pain, unspecified: Secondary | ICD-10-CM | POA: Diagnosis not present

## 2024-05-13 DIAGNOSIS — M79609 Pain in unspecified limb: Secondary | ICD-10-CM | POA: Diagnosis not present

## 2024-05-31 DIAGNOSIS — Z95810 Presence of automatic (implantable) cardiac defibrillator: Secondary | ICD-10-CM | POA: Diagnosis not present

## 2024-05-31 DIAGNOSIS — M50121 Cervical disc disorder at C4-C5 level with radiculopathy: Secondary | ICD-10-CM | POA: Diagnosis not present

## 2024-05-31 DIAGNOSIS — I2510 Atherosclerotic heart disease of native coronary artery without angina pectoris: Secondary | ICD-10-CM | POA: Diagnosis not present

## 2024-05-31 DIAGNOSIS — M5011 Cervical disc disorder with radiculopathy,  high cervical region: Secondary | ICD-10-CM | POA: Diagnosis not present

## 2024-05-31 DIAGNOSIS — F1010 Alcohol abuse, uncomplicated: Secondary | ICD-10-CM | POA: Diagnosis not present

## 2024-05-31 DIAGNOSIS — F17210 Nicotine dependence, cigarettes, uncomplicated: Secondary | ICD-10-CM | POA: Diagnosis not present

## 2024-05-31 DIAGNOSIS — K219 Gastro-esophageal reflux disease without esophagitis: Secondary | ICD-10-CM | POA: Diagnosis not present

## 2024-05-31 DIAGNOSIS — M5412 Radiculopathy, cervical region: Secondary | ICD-10-CM | POA: Diagnosis not present

## 2024-05-31 DIAGNOSIS — M4802 Spinal stenosis, cervical region: Secondary | ICD-10-CM | POA: Diagnosis not present

## 2024-05-31 DIAGNOSIS — I10 Essential (primary) hypertension: Secondary | ICD-10-CM | POA: Diagnosis not present

## 2024-05-31 DIAGNOSIS — Z79899 Other long term (current) drug therapy: Secondary | ICD-10-CM | POA: Diagnosis not present

## 2024-05-31 DIAGNOSIS — M50122 Cervical disc disorder at C5-C6 level with radiculopathy: Secondary | ICD-10-CM | POA: Diagnosis not present

## 2024-05-31 DIAGNOSIS — Z7985 Long-term (current) use of injectable non-insulin antidiabetic drugs: Secondary | ICD-10-CM | POA: Diagnosis not present

## 2024-05-31 DIAGNOSIS — M50123 Cervical disc disorder at C6-C7 level with radiculopathy: Secondary | ICD-10-CM | POA: Diagnosis not present

## 2024-06-01 DIAGNOSIS — F17210 Nicotine dependence, cigarettes, uncomplicated: Secondary | ICD-10-CM | POA: Diagnosis not present

## 2024-06-01 DIAGNOSIS — K219 Gastro-esophageal reflux disease without esophagitis: Secondary | ICD-10-CM | POA: Diagnosis not present

## 2024-06-01 DIAGNOSIS — J9811 Atelectasis: Secondary | ICD-10-CM | POA: Diagnosis not present

## 2024-06-01 DIAGNOSIS — M5412 Radiculopathy, cervical region: Secondary | ICD-10-CM | POA: Diagnosis not present

## 2024-06-01 DIAGNOSIS — M50123 Cervical disc disorder at C6-C7 level with radiculopathy: Secondary | ICD-10-CM | POA: Diagnosis not present

## 2024-06-01 DIAGNOSIS — Z7985 Long-term (current) use of injectable non-insulin antidiabetic drugs: Secondary | ICD-10-CM | POA: Diagnosis not present

## 2024-06-01 DIAGNOSIS — Z79899 Other long term (current) drug therapy: Secondary | ICD-10-CM | POA: Diagnosis not present

## 2024-06-01 DIAGNOSIS — M4802 Spinal stenosis, cervical region: Secondary | ICD-10-CM | POA: Diagnosis not present

## 2024-06-01 DIAGNOSIS — Z95810 Presence of automatic (implantable) cardiac defibrillator: Secondary | ICD-10-CM | POA: Diagnosis not present

## 2024-06-01 DIAGNOSIS — F1010 Alcohol abuse, uncomplicated: Secondary | ICD-10-CM | POA: Diagnosis not present

## 2024-06-01 DIAGNOSIS — I2510 Atherosclerotic heart disease of native coronary artery without angina pectoris: Secondary | ICD-10-CM | POA: Diagnosis not present

## 2024-06-02 DIAGNOSIS — F1010 Alcohol abuse, uncomplicated: Secondary | ICD-10-CM | POA: Diagnosis not present

## 2024-06-02 DIAGNOSIS — M50123 Cervical disc disorder at C6-C7 level with radiculopathy: Secondary | ICD-10-CM | POA: Diagnosis not present

## 2024-06-02 DIAGNOSIS — Z7985 Long-term (current) use of injectable non-insulin antidiabetic drugs: Secondary | ICD-10-CM | POA: Diagnosis not present

## 2024-06-02 DIAGNOSIS — K219 Gastro-esophageal reflux disease without esophagitis: Secondary | ICD-10-CM | POA: Diagnosis not present

## 2024-06-02 DIAGNOSIS — I2510 Atherosclerotic heart disease of native coronary artery without angina pectoris: Secondary | ICD-10-CM | POA: Diagnosis not present

## 2024-06-02 DIAGNOSIS — F17210 Nicotine dependence, cigarettes, uncomplicated: Secondary | ICD-10-CM | POA: Diagnosis not present

## 2024-06-02 DIAGNOSIS — M4802 Spinal stenosis, cervical region: Secondary | ICD-10-CM | POA: Diagnosis not present

## 2024-06-02 DIAGNOSIS — Z95810 Presence of automatic (implantable) cardiac defibrillator: Secondary | ICD-10-CM | POA: Diagnosis not present

## 2024-06-02 DIAGNOSIS — Z79899 Other long term (current) drug therapy: Secondary | ICD-10-CM | POA: Diagnosis not present

## 2024-06-07 ENCOUNTER — Encounter: Admitting: Cardiology

## 2024-06-17 NOTE — Progress Notes (Signed)
 Remote pacemaker transmission.

## 2024-07-09 ENCOUNTER — Ambulatory Visit: Payer: Medicare HMO

## 2024-07-09 DIAGNOSIS — I495 Sick sinus syndrome: Secondary | ICD-10-CM | POA: Diagnosis not present

## 2024-07-10 LAB — CUP PACEART REMOTE DEVICE CHECK
Date Time Interrogation Session: 20250926101957
Implantable Lead Connection Status: 753985
Implantable Lead Connection Status: 753985
Implantable Lead Implant Date: 20230628
Implantable Lead Implant Date: 20230628
Implantable Lead Location: 753859
Implantable Lead Location: 753860
Implantable Lead Model: 377
Implantable Lead Model: 377
Implantable Lead Serial Number: 8000913511
Implantable Lead Serial Number: 8000943292
Implantable Pulse Generator Implant Date: 20230628
Pulse Gen Model: 407145
Pulse Gen Serial Number: 70446212

## 2024-07-12 ENCOUNTER — Ambulatory Visit: Payer: Self-pay | Admitting: Cardiology

## 2024-07-14 NOTE — Progress Notes (Signed)
 Remote PPM Transmission

## 2024-07-27 DIAGNOSIS — M5412 Radiculopathy, cervical region: Secondary | ICD-10-CM | POA: Diagnosis not present

## 2024-08-03 DIAGNOSIS — Z0000 Encounter for general adult medical examination without abnormal findings: Secondary | ICD-10-CM | POA: Diagnosis not present

## 2024-08-03 DIAGNOSIS — Z9181 History of falling: Secondary | ICD-10-CM | POA: Diagnosis not present

## 2024-08-04 ENCOUNTER — Ambulatory Visit: Attending: Cardiology | Admitting: Cardiology

## 2024-08-04 ENCOUNTER — Encounter: Payer: Self-pay | Admitting: Cardiology

## 2024-08-04 VITALS — BP 140/86 | HR 82 | Ht 71.0 in | Wt 241.2 lb

## 2024-08-04 DIAGNOSIS — F17210 Nicotine dependence, cigarettes, uncomplicated: Secondary | ICD-10-CM

## 2024-08-04 DIAGNOSIS — I10 Essential (primary) hypertension: Secondary | ICD-10-CM | POA: Diagnosis not present

## 2024-08-04 DIAGNOSIS — Z952 Presence of prosthetic heart valve: Secondary | ICD-10-CM | POA: Diagnosis not present

## 2024-08-04 DIAGNOSIS — F17200 Nicotine dependence, unspecified, uncomplicated: Secondary | ICD-10-CM | POA: Diagnosis not present

## 2024-08-04 DIAGNOSIS — Z720 Tobacco use: Secondary | ICD-10-CM

## 2024-08-04 DIAGNOSIS — I25119 Atherosclerotic heart disease of native coronary artery with unspecified angina pectoris: Secondary | ICD-10-CM

## 2024-08-04 DIAGNOSIS — Z951 Presence of aortocoronary bypass graft: Secondary | ICD-10-CM

## 2024-08-04 NOTE — Patient Instructions (Signed)

## 2024-08-04 NOTE — Progress Notes (Signed)
 Cardiology Office Note:    Date:  08/04/2024   ID:  Blake Griffin, DOB Dec 10, 1955, MRN [id number]  PCP:  Elaine Garnette BIRCH., MD  Cardiologist:  Jennifer JONELLE Crape, MD   Referring MD: Elaine Garnette BIRCH., MD    ASSESSMENT:    1. Coronary artery disease involving native coronary artery of native heart with angina pectoris   2. Benign essential HTN   3. S/P AVR (aortic valve replacement)    4. S/P CABG x 1   5. Tobacco use    PLAN:    In order of problems listed above:  Coronary artery disease: Secondary prevention stressed with the patient importance of compliance with diet medication stressed any vocalized understanding.  He was advised to walk to the best of his ability. Cigarette smoker: I spent 5 minutes with the patient discussing solely about smoking. Smoking cessation was counseled. I suggested to the patient also different medications and pharmacological interventions. Patient is keen to try stopping on its own at this time. He will get back to me if he needs any further assistance in this matter. Essential hypertension: Blood pressure is stable and diet was emphasized. Mixed dyslipidemia: On lipid-lowering medications followed by primary care.  Lipids reviewed. Obesity: Weight reduction stressed and he promises to do better. Post aortic valve replacement: Stable and asymptomatic Patient will be seen in follow-up appointment in 9 months or earlier if the patient has any concerns.    Medication Adjustments/Labs and Tests Ordered: Current medicines are reviewed at length with the patient today.  Concerns regarding medicines are outlined above.  No orders of the defined types were placed in this encounter.  No orders of the defined types were placed in this encounter.    No chief complaint on file.    History of Present Illness:    Blake Griffin is a 68 y.o. male.  Patient has past medical history of coronary artery disease, essential hypertension, mixed  dyslipidemia and history of smoking.  Unfortunately continues to smoke.  He denies any chest pain orthopnea or PND.  At the time of my evaluation, the patient is alert awake oriented and in no distress.  Past Medical History:  Diagnosis Date   Abnormal nuclear cardiac imaging test    Acute on chronic heart failure with mid-range ejection fraction (HFmEF) (HCC) 01/26/2022   Alcohol use 01/26/2022   Anal fissure    Aortic stenosis    Atrial fibrillation with RVR (HCC) 01/27/2022   Benign essential HTN 08/28/2017   Bilateral carotid bruits    Carpal tunnel syndrome, bilateral 09/15/2023   Cervical radiculopathy 09/15/2023   COPD (chronic obstructive pulmonary disease) (HCC)    Coronary artery disease involving native coronary artery of native heart with angina pectoris    Dyslipidemia    ED (erectile dysfunction)    Erectile dysfunction 06/29/2018   Hemorrhoids    Hypertension    Hypogonadism in male    Left bundle branch block (LBBB) 08/06/2018   Leg cramps    Light headed 08/28/2017   Near syncope 01/26/2022   Nonrheumatic aortic valve stenosis 08/28/2017   Pain of left shoulder region    Petechiae    Pneumonia 2016   S/P aortic valve replacement with bioprosthetic valve 08/06/2018   25 mm Edwards Intuity Elite stented bovine pericardial bioprosthetic tissue valve  [id number] Model: 8300AB   S/P AVR (aortic valve replacement)  08/06/2018   25 mm Edwards Intuity Elite stented bovine pericardial bioprosthetic tissue valve  [id number] Model:  8300AB   S/P CABG x 1 08/06/2018   SVG to RCA, EVH via right thigh   Smoking    Syncope 04/09/2022   Tachycardia, unspecified 01/28/2022   Tobacco use 01/26/2022   Type 2 diabetes mellitus without complication, without long-term current use of insulin  (HCC) 09/15/2023   Ulnar neuropathy of both upper extremities 09/15/2023   Vitamin D  deficiency     Past Surgical History:  Procedure Laterality Date   AORTIC VALVE REPLACEMENT N/A  08/06/2018   Procedure: AORTIC VALVE REPLACEMENT (AVR);  Surgeon: Dusty Sudie DEL, MD;  Location: [facility] OR;  Service: Open Heart Surgery;  Laterality: N/A;   APPENDECTOMY     COLONOSCOPY W/ POLYPECTOMY     x 2   CORONARY ARTERY BYPASS GRAFT N/A 08/06/2018   Procedure: CORONARY ARTERY BYPASS GRAFTING (CABG) times one using right greater saphenous vein harvested endoscopically and aortic valve replacement.;  Surgeon: Dusty Sudie DEL, MD;  Location: [facility] OR;  Service: Open Heart Surgery;  Laterality: N/A;   CORONARY/GRAFT ANGIOGRAPHY N/A 11/21/2020   Procedure: CORONARY/GRAFT ANGIOGRAPHY;  Surgeon: Burnard Debby LABOR, MD;  Location: [facility] INVASIVE CV LAB;  Service: Cardiovascular;  Laterality: N/A;   Lung Biospy Right    lower lung   PACEMAKER IMPLANT N/A 04/10/2022   Procedure: PACEMAKER IMPLANT;  Surgeon: Inocencio Soyla Lunger, MD;  Location: [facility] INVASIVE CV LAB;  Service: Cardiovascular;  Laterality: N/A;   RIGHT/LEFT HEART CATH AND CORONARY ANGIOGRAPHY N/A 06/30/2018   Procedure: RIGHT/LEFT HEART CATH AND CORONARY ANGIOGRAPHY;  Surgeon: Claudene Victory ORN, MD;  Location: [facility] INVASIVE CV LAB;  Service: Cardiovascular;  Laterality: N/A;   TEE WITHOUT CARDIOVERSION N/A 08/06/2018   Procedure: TRANSESOPHAGEAL ECHOCARDIOGRAM (TEE);  Surgeon: Dusty Sudie DEL, MD;  Location: [facility] OR;  Service: Open Heart Surgery;  Laterality: N/A;   TOOTH EXTRACTION N/A 07/30/2018   Procedure: Extraction of tooth #5 w/ Alveoloplasty and Gross debridement of remaining dentition.;  Surgeon: Cyndee Tanda FALCON, DDS;  Location: [facility] OR;  Service: Oral Surgery;  Laterality: N/A;    Current Medications: Current Meds  Medication Sig   albuterol  (PROVENTIL  HFA;VENTOLIN  HFA) 108 (90 Base) MCG/ACT inhaler Inhale 2 puffs into the lungs every 6 (six) hours as needed for wheezing or shortness of breath.    aspirin  EC 81 MG tablet Take 1 tablet (81 mg total) by mouth daily.   atorvastatin  (LIPITOR ) 40 MG tablet Take 40 mg by mouth daily.   D3-50  1.25 MG (50000 UT) capsule Take 50,000 Units by mouth every Sunday.   furosemide  (LASIX ) 40 MG tablet Take 40 mg by mouth daily.   lisinopril  (ZESTRIL ) 20 MG tablet Take 1 tablet (20 mg total) by mouth daily.   metoprolol  succinate (TOPROL -XL) 25 MG 24 hr tablet TAKE 1 TABLET (25 MG TOTAL) BY MOUTH DAILY.   naproxen sodium (ALEVE) 220 MG tablet Take 440 mg by mouth every morning.   OZEMPIC, 0.25 OR 0.5 MG/DOSE, 2 MG/3ML SOPN Inject 0.25 mg into the skin once a week.   ranolazine  (RANEXA ) 1000 MG SR tablet Take 1 tablet (1,000 mg total) by mouth 2 (two) times daily.   spironolactone (ALDACTONE) 25 MG tablet Take 25 mg by mouth daily.   [DISCONTINUED] atorvastatin  (LIPITOR ) 20 MG tablet Take 1 tablet (20 mg total) by mouth daily.     Allergies:   Patient has no known allergies.   Social History   Socioeconomic History   Marital status: Married    Spouse name: Not on file   Number of  children: 5   Years of education: Not on file   Highest education level: Not on file  Occupational History   Not on file  Tobacco Use   Smoking status: Former    Current packs/day: 0.00    Average packs/day: 1 pack/day for 50.0 years (50.0 ttl pk-yrs)    Types: Cigarettes    Start date: 08/06/1968    Quit date: 08/06/2018    Years since quitting: 6.0   Smokeless tobacco: Never  Vaping Use   Vaping status: Never Used  Substance and Sexual Activity   Alcohol use: Yes    Alcohol/week: 14.0 standard drinks of alcohol    Types: 14 Shots of liquor per week    Comment: Bacardi rum daily   Drug use: Never   Sexual activity: Not on file  Other Topics Concern   Not on file  Social History Narrative   Not on file   Social Drivers of Health   Financial Resource Strain: Not on file  Food Insecurity: Not on file  Transportation Needs: Not on file  Physical Activity: Not on file  Stress: Not on file  Social Connections: Not on file     Family History: The patient's family history includes Coronary  artery disease in his mother; Hypertension in his father and mother.  ROS:   Please see the history of present illness.    All other systems reviewed and are negative.  EKGs/Labs/Other Studies Reviewed:    The following studies were reviewed today: .SABRA   I discussed my findings with the patient at length   Recent Labs: 04/14/2024: BUN 18; Creatinine, Ser 0.86; Hemoglobin 15.0; NT-Pro BNP 168; Platelets 175; Potassium 4.2; Sodium 143  Recent Lipid Panel    Component Value Date/Time   CHOL 113 12/24/2022 0905   TRIG 102 12/24/2022 0905   HDL 41 12/24/2022 0905   CHOLHDL 2.8 12/24/2022 0905   LDLCALC 53 12/24/2022 0905    Physical Exam:    VS:  BP (!) 140/86   Pulse 82   Ht 5' 11 (1.803 m)   Wt 241 lb 3.2 oz (109.4 kg)   SpO2 92%   BMI 33.64 kg/m     Wt Readings from Last 3 Encounters:  08/04/24 241 lb 3.2 oz (109.4 kg)  05/10/24 238 lb (108 kg)  04/14/24 244 lb 9.6 oz (110.9 kg)     GEN: Patient is in no acute distress HEENT: Normal NECK: No JVD; No carotid bruits LYMPHATICS: No lymphadenopathy CARDIAC: Hear sounds regular, 2/6 systolic murmur at the apex. RESPIRATORY:  Clear to auscultation without rales, wheezing or rhonchi  ABDOMEN: Soft, non-tender, non-distended MUSCULOSKELETAL:  No edema; No deformity  SKIN: Warm and dry NEUROLOGIC:  Alert and oriented x 3 PSYCHIATRIC:  Normal affect   Signed, Jennifer JONELLE Crape, MD  08/04/2024 11:42 AM    [facility]

## 2024-08-18 ENCOUNTER — Other Ambulatory Visit: Payer: Self-pay | Admitting: Cardiology

## 2024-08-31 DIAGNOSIS — E119 Type 2 diabetes mellitus without complications: Secondary | ICD-10-CM | POA: Diagnosis not present

## 2024-08-31 DIAGNOSIS — H52221 Regular astigmatism, right eye: Secondary | ICD-10-CM | POA: Diagnosis not present

## 2024-08-31 DIAGNOSIS — H5213 Myopia, bilateral: Secondary | ICD-10-CM | POA: Diagnosis not present

## 2024-08-31 DIAGNOSIS — H524 Presbyopia: Secondary | ICD-10-CM | POA: Diagnosis not present

## 2024-08-31 DIAGNOSIS — H25813 Combined forms of age-related cataract, bilateral: Secondary | ICD-10-CM | POA: Diagnosis not present

## 2024-09-16 DIAGNOSIS — M5412 Radiculopathy, cervical region: Secondary | ICD-10-CM | POA: Diagnosis not present

## 2024-09-16 DIAGNOSIS — Z981 Arthrodesis status: Secondary | ICD-10-CM | POA: Diagnosis not present

## 2024-10-08 ENCOUNTER — Ambulatory Visit (INDEPENDENT_AMBULATORY_CARE_PROVIDER_SITE_OTHER): Payer: Medicare HMO

## 2024-10-08 DIAGNOSIS — I495 Sick sinus syndrome: Secondary | ICD-10-CM | POA: Diagnosis not present

## 2024-10-10 LAB — CUP PACEART REMOTE DEVICE CHECK
Date Time Interrogation Session: 20251226173729
Implantable Lead Connection Status: 753985
Implantable Lead Connection Status: 753985
Implantable Lead Implant Date: 20230628
Implantable Lead Implant Date: 20230628
Implantable Lead Location: 753859
Implantable Lead Location: 753860
Implantable Lead Model: 377
Implantable Lead Model: 377
Implantable Lead Serial Number: 8000913511
Implantable Lead Serial Number: 8000943292
Implantable Pulse Generator Implant Date: 20230628
Pulse Gen Model: 407145
Pulse Gen Serial Number: 70446212

## 2024-10-11 ENCOUNTER — Ambulatory Visit: Payer: Self-pay | Admitting: Cardiology

## 2024-10-13 NOTE — Progress Notes (Signed)
 Remote PPM Transmission

## 2024-11-01 ENCOUNTER — Encounter: Payer: Self-pay | Admitting: Cardiology

## 2024-11-01 ENCOUNTER — Ambulatory Visit: Attending: Cardiology | Admitting: Cardiology

## 2024-11-01 VITALS — BP 140/74 | HR 80 | Ht 71.0 in | Wt 243.1 lb

## 2024-11-01 DIAGNOSIS — E785 Hyperlipidemia, unspecified: Secondary | ICD-10-CM

## 2024-11-01 DIAGNOSIS — I25119 Atherosclerotic heart disease of native coronary artery with unspecified angina pectoris: Secondary | ICD-10-CM

## 2024-11-01 DIAGNOSIS — Z720 Tobacco use: Secondary | ICD-10-CM

## 2024-11-01 DIAGNOSIS — E119 Type 2 diabetes mellitus without complications: Secondary | ICD-10-CM | POA: Diagnosis not present

## 2024-11-01 DIAGNOSIS — E559 Vitamin D deficiency, unspecified: Secondary | ICD-10-CM

## 2024-11-01 DIAGNOSIS — Z953 Presence of xenogenic heart valve: Secondary | ICD-10-CM | POA: Diagnosis not present

## 2024-11-01 DIAGNOSIS — I10 Essential (primary) hypertension: Secondary | ICD-10-CM

## 2024-11-01 NOTE — Patient Instructions (Addendum)
 Medication Instructions:  Your physician recommends that you continue on your current medications as directed. Please refer to the Current Medication list given to you today.  *If you need a refill on your cardiac medications before your next appointment, please call your pharmacy*   Lab Work: Your physician recommends that you return for lab work in: the next few days for CMP, CBC, TSH, vitamin D , A1C and fasting lipids. You need to have labs done when you are fasting.  You can come Monday through Friday 8:30 am to 12:00 pm and 1:15 to 4:30. You do not need to make an appointment as the order has already been placed.   If you have labs (blood work) drawn today and your tests are completely normal, you will receive your results only by: MyChart Message (if you have MyChart) OR A paper copy in the mail If you have any lab test that is abnormal or we need to change your treatment, we will call you to review the results.   Testing/Procedures: None ordered   Follow-Up: At [facility], you and your health needs are our priority.  As part of our continuing mission to provide you with exceptional heart care, we have created designated Provider Care Teams.  These Care Teams include your primary Cardiologist (physician) and Advanced Practice Providers (APPs -  Physician Assistants and Nurse Practitioners) who all work together to provide you with the care you need, when you need it.  We recommend signing up for the patient portal called MyChart.  Sign up information is provided on this After Visit Summary.  MyChart is used to connect with patients for Virtual Visits (Telemedicine).  Patients are able to view lab/test results, encounter notes, upcoming appointments, etc.  Non-urgent messages can be sent to your provider as well.   To learn more about what you can do with MyChart, go to [email or web address].    Your next appointment:   9 month(s)  The format for your next  appointment:   In Person  Provider:   Jennifer Crape, MD    Other Instructions none  Important Information About Sugar

## 2024-11-01 NOTE — Progress Notes (Signed)
 " Cardiology Office Note:    Date:  11/01/2024   ID:  Blake Griffin, DOB Oct 09, 1956, MRN [id number]  PCP:  Elaine Garnette BIRCH., MD  Cardiologist:  Jennifer JONELLE Crape, MD   Referring MD: Elaine Garnette BIRCH., MD    ASSESSMENT:    1. Benign essential HTN   2. Coronary artery disease involving native coronary artery of native heart with angina pectoris   3. S/P aortic valve replacement with bioprosthetic valve   4. Tobacco use   5. Dyslipidemia    PLAN:    In order of problems listed above:  Coronary artery disease: Secondary prevention stressed with the patient.  Importance of compliance with diet medication stressed and patient verbalized standing. Post aortic valve replacement: Stable at this time.  Patient is asymptomatic.  Echo report discussed with the patient at length Essential hypertension: Blood pressure is stable and diet was emphasized.  Lifestyle modification urged. Mixed dyslipidemia: Lipid-lowering medications followed by primary care.  He plans to come for blood work in the next few days.  He also requests vitamin D  DM A1c and we will do this for him. Permanent pacemaker insertion: Stable and followed by electrophysiology colleagues. Cigarette smoker: I spent 5 minutes with the patient discussing solely about smoking. Smoking cessation was counseled. I suggested to the patient also different medications and pharmacological interventions. Patient is keen to try stopping on its own at this time. He will get back to me if he needs any further assistance in this matter. Patient will be seen in follow-up appointment in 6 months or earlier if the patient has any concerns.    Medication Adjustments/Labs and Tests Ordered: Current medicines are reviewed at length with the patient today.  Concerns regarding medicines are outlined above.  No orders of the defined types were placed in this encounter.  No orders of the defined types were placed in this encounter.    No chief  complaint on file.    History of Present Illness:    Blake Griffin is a 69 y.o. male.  Patient has past medical history of coronary artery disease, aortic valve replacement, essential hypertension, mixed dyslipidemia and unfortunately continues to smoke.  He leads a sedentary lifestyle.  He denies any chest pain orthopnea or PND.  At the time of my evaluation, the patient is alert awake oriented and in no distress.  Past Medical History:  Diagnosis Date   Abnormal nuclear cardiac imaging test    Acute on chronic heart failure with mid-range ejection fraction (HFmEF) (HCC) 01/26/2022   Alcohol use 01/26/2022   Anal fissure    Aortic stenosis    Atrial fibrillation with RVR (HCC) 01/27/2022   Benign essential HTN 08/28/2017   Bilateral carotid bruits    Carpal tunnel syndrome, bilateral 09/15/2023   Cervical radiculopathy 09/15/2023   COPD (chronic obstructive pulmonary disease) (HCC)    Coronary artery disease involving native coronary artery of native heart with angina pectoris    Dyslipidemia    ED (erectile dysfunction)    Erectile dysfunction 06/29/2018   Hemorrhoids    Hypertension    Hypogonadism in male    Left bundle branch block (LBBB) 08/06/2018   Leg cramps    Light headed 08/28/2017   Near syncope 01/26/2022   Nonrheumatic aortic valve stenosis 08/28/2017   Pain of left shoulder region    Petechiae    Pneumonia 2016   S/P aortic valve replacement with bioprosthetic valve 08/06/2018   25 mm [place]  stented bovine pericardial bioprosthetic tissue valve  [id number] Model: 8300AB   S/P AVR (aortic valve replacement)  08/06/2018   25 mm Edwards Intuity Elite stented bovine pericardial bioprosthetic tissue valve  [id number] Model: 8300AB   S/P CABG x 1 08/06/2018   SVG to RCA, EVH via right thigh   Smoking    Syncope 04/09/2022   Tachycardia, unspecified 01/28/2022   Tobacco use 01/26/2022   Type 2 diabetes mellitus without complication, without  long-term current use of insulin  (HCC) 09/15/2023   Ulnar neuropathy of both upper extremities 09/15/2023   Vitamin D  deficiency     Past Surgical History:  Procedure Laterality Date   AORTIC VALVE REPLACEMENT N/A 08/06/2018   Procedure: AORTIC VALVE REPLACEMENT (AVR);  Surgeon: Dusty Sudie DEL, MD;  Location: [facility] OR;  Service: Open Heart Surgery;  Laterality: N/A;   APPENDECTOMY     COLONOSCOPY W/ POLYPECTOMY     x 2   CORONARY ARTERY BYPASS GRAFT N/A 08/06/2018   Procedure: CORONARY ARTERY BYPASS GRAFTING (CABG) times one using right greater saphenous vein harvested endoscopically and aortic valve replacement.;  Surgeon: Dusty Sudie DEL, MD;  Location: [facility] OR;  Service: Open Heart Surgery;  Laterality: N/A;   CORONARY/GRAFT ANGIOGRAPHY N/A 11/21/2020   Procedure: CORONARY/GRAFT ANGIOGRAPHY;  Surgeon: Burnard Debby LABOR, MD;  Location: [facility] INVASIVE CV LAB;  Service: Cardiovascular;  Laterality: N/A;   Lung Biospy Right    lower lung   PACEMAKER IMPLANT N/A 04/10/2022   Procedure: PACEMAKER IMPLANT;  Surgeon: Inocencio Soyla Lunger, MD;  Location: [facility] INVASIVE CV LAB;  Service: Cardiovascular;  Laterality: N/A;   RIGHT/LEFT HEART CATH AND CORONARY ANGIOGRAPHY N/A 06/30/2018   Procedure: RIGHT/LEFT HEART CATH AND CORONARY ANGIOGRAPHY;  Surgeon: Claudene Victory ORN, MD;  Location: [facility] INVASIVE CV LAB;  Service: Cardiovascular;  Laterality: N/A;   TEE WITHOUT CARDIOVERSION N/A 08/06/2018   Procedure: TRANSESOPHAGEAL ECHOCARDIOGRAM (TEE);  Surgeon: Dusty Sudie DEL, MD;  Location: [facility] OR;  Service: Open Heart Surgery;  Laterality: N/A;   TOOTH EXTRACTION N/A 07/30/2018   Procedure: Extraction of tooth #5 w/ Alveoloplasty and Gross debridement of remaining dentition.;  Surgeon: Cyndee Tanda FALCON, DDS;  Location: [facility] OR;  Service: Oral Surgery;  Laterality: N/A;    Current Medications: Active Medications[1]   Allergies:   Patient has no known allergies.   Social History   Socioeconomic History   Marital  status: Married    Spouse name: Not on file   Number of children: 5   Years of education: Not on file   Highest education level: Not on file  Occupational History   Not on file  Tobacco Use   Smoking status: Former    Current packs/day: 0.00    Average packs/day: 1 pack/day for 50.0 years (50.0 ttl pk-yrs)    Types: Cigarettes    Start date: 08/06/1968    Quit date: 08/06/2018    Years since quitting: 6.2   Smokeless tobacco: Never  Vaping Use   Vaping status: Never Used  Substance and Sexual Activity   Alcohol use: Yes    Alcohol/week: 14.0 standard drinks of alcohol    Types: 14 Shots of liquor per week    Comment: Bacardi rum daily   Drug use: Never   Sexual activity: Not on file  Other Topics Concern   Not on file  Social History Narrative   Not on file   Social Drivers of Health   Tobacco Use: Medium Risk (11/01/2024)   Patient History  Smoking Tobacco Use: Former    Smokeless Tobacco Use: Never    Passive Exposure: Not on Actuary Strain: Not on file  Food Insecurity: Not on file  Transportation Needs: Not on file  Physical Activity: Not on file  Stress: Not on file  Social Connections: Not on file  Depression (EYV7-0): Not on file  Alcohol Screen: Not on file  Housing: Not on file  Utilities: Not on file  Health Literacy: Not on file     Family History: The patient's family history includes Coronary artery disease in his mother; Hypertension in his father and mother.  ROS:   Please see the history of present illness.    All other systems reviewed and are negative.  EKGs/Labs/Other Studies Reviewed:    The following studies were reviewed today: .SABRA   I discussed my findings with the patient at length   Recent Labs: 04/14/2024: BUN 18; Creatinine, Ser 0.86; Hemoglobin 15.0; NT-Pro BNP 168; Platelets 175; Potassium 4.2; Sodium 143  Recent Lipid Panel    Component Value Date/Time   CHOL 113 12/24/2022 0905   TRIG 102 12/24/2022  0905   HDL 41 12/24/2022 0905   CHOLHDL 2.8 12/24/2022 0905   LDLCALC 53 12/24/2022 0905    Physical Exam:    VS:  BP (!) 140/74   Pulse 80   Ht 5' 11 (1.803 m)   Wt 243 lb 2 oz (110.3 kg)   SpO2 94%   BMI 33.91 kg/m     Wt Readings from Last 3 Encounters:  11/01/24 243 lb 2 oz (110.3 kg)  08/04/24 241 lb 3.2 oz (109.4 kg)  05/10/24 238 lb (108 kg)     GEN: Patient is in no acute distress HEENT: Normal NECK: No JVD; No carotid bruits LYMPHATICS: No lymphadenopathy CARDIAC: Hear sounds regular, 2/6 systolic murmur at the apex. RESPIRATORY:  Clear to auscultation without rales, wheezing or rhonchi  ABDOMEN: Soft, non-tender, non-distended MUSCULOSKELETAL:  No edema; No deformity  SKIN: Warm and dry NEUROLOGIC:  Alert and oriented x 3 PSYCHIATRIC:  Normal affect   Signed, Jennifer JONELLE Crape, MD  11/01/2024 2:44 PM    [facility]     [1]  Current Meds  Medication Sig   albuterol  (PROVENTIL  HFA;VENTOLIN  HFA) 108 (90 Base) MCG/ACT inhaler Inhale 2 puffs into the lungs every 6 (six) hours as needed for wheezing or shortness of breath.    aspirin  EC 81 MG tablet Take 1 tablet (81 mg total) by mouth daily.   atorvastatin  (LIPITOR ) 20 MG tablet Take 20 mg by mouth daily.   D3-50 1.25 MG (50000 UT) capsule Take 50,000 Units by mouth every Sunday.   furosemide  (LASIX ) 40 MG tablet Take 40 mg by mouth daily.   lisinopril  (ZESTRIL ) 20 MG tablet TAKE 1 TABLET BY MOUTH EVERY DAY   metoprolol  succinate (TOPROL -XL) 25 MG 24 hr tablet TAKE 1 TABLET (25 MG TOTAL) BY MOUTH DAILY.   naproxen sodium (ALEVE) 220 MG tablet Take 440 mg by mouth every morning.   OZEMPIC, 0.25 OR 0.5 MG/DOSE, 2 MG/3ML SOPN Inject 0.25 mg into the skin once a week.   ranolazine  (RANEXA ) 1000 MG SR tablet Take 1 tablet (1,000 mg total) by mouth 2 (two) times daily.   "

## 2024-11-04 LAB — CBC
Hematocrit: 48 % (ref 37.5–51.0)
Hemoglobin: 15.9 g/dL (ref 13.0–17.7)
MCH: 32 pg (ref 26.6–33.0)
MCHC: 33.1 g/dL (ref 31.5–35.7)
MCV: 97 fL (ref 79–97)
Platelets: 177 x10E3/uL (ref 150–450)
RBC: 4.97 x10E6/uL (ref 4.14–5.80)
RDW: 12.7 % (ref 11.6–15.4)
WBC: 7.9 x10E3/uL (ref 3.4–10.8)

## 2024-11-04 LAB — COMPREHENSIVE METABOLIC PANEL WITH GFR
ALT: 21 IU/L (ref 0–44)
AST: 13 IU/L (ref 0–40)
Albumin: 4.1 g/dL (ref 3.9–4.9)
Alkaline Phosphatase: 82 IU/L (ref 47–123)
BUN/Creatinine Ratio: 24 (ref 10–24)
BUN: 21 mg/dL (ref 8–27)
Bilirubin Total: 0.4 mg/dL (ref 0.0–1.2)
CO2: 25 mmol/L (ref 20–29)
Calcium: 9 mg/dL (ref 8.6–10.2)
Chloride: 103 mmol/L (ref 96–106)
Creatinine, Ser: 0.87 mg/dL (ref 0.76–1.27)
Globulin, Total: 2 g/dL (ref 1.5–4.5)
Glucose: 118 mg/dL — ABNORMAL HIGH (ref 70–99)
Potassium: 4.3 mmol/L (ref 3.5–5.2)
Sodium: 143 mmol/L (ref 134–144)
Total Protein: 6.1 g/dL (ref 6.0–8.5)
eGFR: 93 mL/min/1.73

## 2024-11-04 LAB — LIPID PANEL
Chol/HDL Ratio: 2.7 ratio (ref 0.0–5.0)
Cholesterol, Total: 112 mg/dL (ref 100–199)
HDL: 41 mg/dL
LDL Chol Calc (NIH): 53 mg/dL (ref 0–99)
Triglycerides: 90 mg/dL (ref 0–149)
VLDL Cholesterol Cal: 18 mg/dL (ref 5–40)

## 2024-11-04 LAB — TSH: TSH: 1.63 u[IU]/mL (ref 0.450–4.500)

## 2024-11-04 LAB — VITAMIN D 25 HYDROXY (VIT D DEFICIENCY, FRACTURES): Vit D, 25-Hydroxy: 56.2 ng/mL (ref 30.0–100.0)

## 2024-11-04 LAB — HEMOGLOBIN A1C
Est. average glucose Bld gHb Est-mCnc: 146 mg/dL
Hgb A1c MFr Bld: 6.7 % — ABNORMAL HIGH (ref 4.8–5.6)

## 2024-11-05 ENCOUNTER — Ambulatory Visit: Payer: Self-pay | Admitting: Cardiology
